# Patient Record
Sex: Female | Born: 1935 | Race: White | Hispanic: No | Smoking: Former smoker
Health system: Southern US, Community
[De-identification: ages and names within clinical notes are randomized; demographics above are authoritative.]

## PROBLEM LIST (undated history)

## (undated) DIAGNOSIS — F419 Anxiety disorder, unspecified: Secondary | ICD-10-CM

## (undated) DIAGNOSIS — L309 Dermatitis, unspecified: Secondary | ICD-10-CM

## (undated) DIAGNOSIS — J8410 Pulmonary fibrosis, unspecified: Secondary | ICD-10-CM

## (undated) DIAGNOSIS — K649 Unspecified hemorrhoids: Secondary | ICD-10-CM

## (undated) DIAGNOSIS — I429 Cardiomyopathy, unspecified: Secondary | ICD-10-CM

## (undated) DIAGNOSIS — J449 Chronic obstructive pulmonary disease, unspecified: Secondary | ICD-10-CM

## (undated) DIAGNOSIS — B029 Zoster without complications: Secondary | ICD-10-CM

## (undated) DIAGNOSIS — M1990 Unspecified osteoarthritis, unspecified site: Secondary | ICD-10-CM

## (undated) DIAGNOSIS — M810 Age-related osteoporosis without current pathological fracture: Secondary | ICD-10-CM

## (undated) DIAGNOSIS — J45909 Unspecified asthma, uncomplicated: Secondary | ICD-10-CM

## (undated) DIAGNOSIS — J302 Other seasonal allergic rhinitis: Secondary | ICD-10-CM

## (undated) DIAGNOSIS — H269 Unspecified cataract: Secondary | ICD-10-CM

## (undated) DIAGNOSIS — E119 Type 2 diabetes mellitus without complications: Secondary | ICD-10-CM

## (undated) DIAGNOSIS — I10 Essential (primary) hypertension: Secondary | ICD-10-CM

## (undated) DIAGNOSIS — F32A Depression, unspecified: Secondary | ICD-10-CM

## (undated) DIAGNOSIS — G4734 Idiopathic sleep related nonobstructive alveolar hypoventilation: Secondary | ICD-10-CM

## (undated) DIAGNOSIS — F329 Major depressive disorder, single episode, unspecified: Secondary | ICD-10-CM

## (undated) DIAGNOSIS — M109 Gout, unspecified: Secondary | ICD-10-CM

## (undated) DIAGNOSIS — E785 Hyperlipidemia, unspecified: Secondary | ICD-10-CM

## (undated) HISTORY — DX: Unspecified osteoarthritis, unspecified site: M19.90

## (undated) HISTORY — DX: Other seasonal allergic rhinitis: J30.2

## (undated) HISTORY — DX: Unspecified cataract: H26.9

## (undated) HISTORY — PX: CARDIAC CATHETERIZATION: SHX172

## (undated) HISTORY — DX: Cardiomyopathy, unspecified: I42.9

## (undated) HISTORY — PX: OPEN REDUCTION INTERNAL FIXATION (ORIF) DISTAL RADIAL FRACTURE: SHX5989

## (undated) HISTORY — DX: Hyperlipidemia, unspecified: E78.5

## (undated) HISTORY — DX: Pulmonary fibrosis, unspecified: J84.10

## (undated) HISTORY — DX: Unspecified hemorrhoids: K64.9

## (undated) HISTORY — DX: Zoster without complications: B02.9

## (undated) HISTORY — DX: Essential (primary) hypertension: I10

## (undated) HISTORY — DX: Unspecified asthma, uncomplicated: J45.909

## (undated) HISTORY — PX: KNEE ARTHROSCOPY: SUR90

## (undated) HISTORY — PX: OTHER SURGICAL HISTORY: SHX169

## (undated) HISTORY — PX: CHOLECYSTECTOMY: SHX55

## (undated) HISTORY — PX: JOINT REPLACEMENT: SHX530

## (undated) HISTORY — DX: Chronic obstructive pulmonary disease, unspecified: J44.9

## (undated) HISTORY — DX: Age-related osteoporosis without current pathological fracture: M81.0

## (undated) HISTORY — PX: BLADDER REPAIR: SHX76

## (undated) HISTORY — PX: TONSILLECTOMY AND ADENOIDECTOMY: SUR1326

## (undated) HISTORY — DX: Gout, unspecified: M10.9

## (undated) HISTORY — DX: Dermatitis, unspecified: L30.9

## (undated) HISTORY — DX: Anxiety disorder, unspecified: F41.9

## (undated) HISTORY — PX: MOUTH SURGERY: SHX715

## (undated) HISTORY — DX: Major depressive disorder, single episode, unspecified: F32.9

## (undated) HISTORY — DX: Depression, unspecified: F32.A

## (undated) HISTORY — DX: Type 2 diabetes mellitus without complications: E11.9

## (undated) HISTORY — DX: Idiopathic sleep related nonobstructive alveolar hypoventilation: G47.34

---

## 2010-10-14 ENCOUNTER — Ambulatory Visit: Payer: Self-pay | Admitting: General Practice

## 2011-05-01 ENCOUNTER — Ambulatory Visit: Payer: Self-pay | Admitting: Internal Medicine

## 2011-05-05 ENCOUNTER — Ambulatory Visit: Payer: Self-pay | Admitting: General Practice

## 2011-05-21 ENCOUNTER — Inpatient Hospital Stay: Payer: Self-pay | Admitting: General Practice

## 2011-08-08 ENCOUNTER — Ambulatory Visit: Payer: Self-pay | Admitting: Internal Medicine

## 2011-12-04 ENCOUNTER — Ambulatory Visit: Payer: Self-pay | Admitting: Cardiology

## 2012-08-10 ENCOUNTER — Emergency Department: Payer: Self-pay | Admitting: Emergency Medicine

## 2012-08-10 LAB — CBC
HCT: 39.7 % (ref 35.0–47.0)
HGB: 12.8 g/dL (ref 12.0–16.0)
MCH: 24.2 pg — ABNORMAL LOW (ref 26.0–34.0)
MCHC: 32.1 g/dL (ref 32.0–36.0)
MCV: 75 fL — ABNORMAL LOW (ref 80–100)
RDW: 15.5 % — ABNORMAL HIGH (ref 11.5–14.5)
WBC: 6.4 10*3/uL (ref 3.6–11.0)

## 2012-08-10 LAB — COMPREHENSIVE METABOLIC PANEL
Albumin: 4.1 g/dL (ref 3.4–5.0)
Alkaline Phosphatase: 103 U/L (ref 50–136)
Anion Gap: 7 (ref 7–16)
BUN: 11 mg/dL (ref 7–18)
Calcium, Total: 8.7 mg/dL (ref 8.5–10.1)
Chloride: 103 mmol/L (ref 98–107)
Co2: 30 mmol/L (ref 21–32)
Creatinine: 0.82 mg/dL (ref 0.60–1.30)
EGFR (African American): >60
Glucose: 107 mg/dL — ABNORMAL HIGH (ref 65–99)
SGPT (ALT): 17 U/L (ref 12–78)
Total Protein: 7.4 g/dL (ref 6.4–8.2)

## 2012-08-10 LAB — URINALYSIS, COMPLETE
Bacteria: NONE SEEN
Ketone: NEGATIVE
RBC,UR: <1 /HPF (ref 0–5)
Specific Gravity: 1.002 (ref 1.003–1.030)
WBC UR: 1 /HPF (ref 0–5)

## 2012-10-11 IMAGING — NM NM LUNG SCAN
2 series · 12 of 12 positions shown · non-contrast
Comparison: none

REASON FOR EXAM: pe
COMMENTS:

[Series 1000: perfusion · 1.95mm/px · 3 acquisitions, 6 frames shown]
[im 1/3]
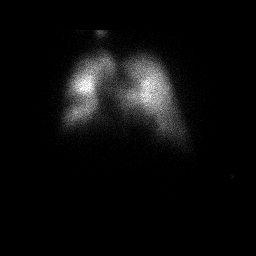
[im 1/3]
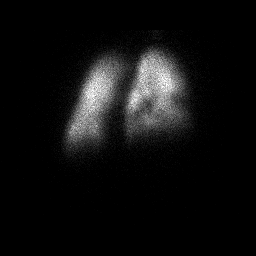
[im 2/3]
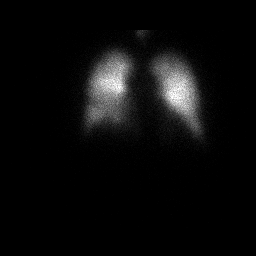
[im 2/3]
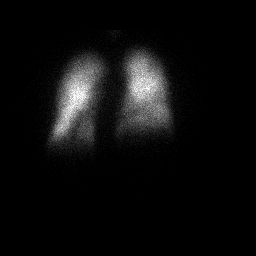
[im 3/3]
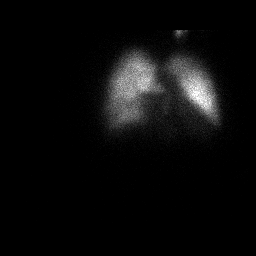
[im 3/3]
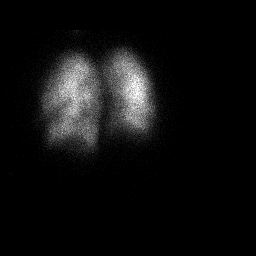

[Series 1000: ventilation · 3.90mm/px · 3 acquisitions, 6 frames shown]
[im 1/3]
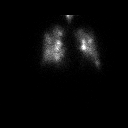
[im 1/3]
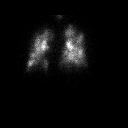
[im 2/3]
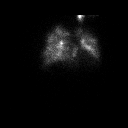
[im 2/3]
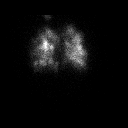
[im 3/3]
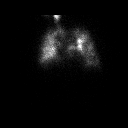
[im 3/3]
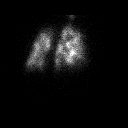

[12 of 12 positions shown; findings below may reference images not displayed]

PROCEDURE:     NM  - NM VQ LUNG SCAN  - [DATE] [DATE] [DATE]  [DATE]

RESULT:     Following aerosol ministration of 39.6 mCi technetium 99m DTPA,
ventilation lung scan was performed an 8 views. After the ventilation scan
and following intravenous administration of 4.18 mCi technetium 99 M labeled
macroaggregated albumin, perfusion lung scan was performed in 6 views. There
is a diffusely heterogeneous distribution of tracer activity in both lungs
on the ventilation scan. This appearance is consistent with ventilatory
disease. And in the LAO view there is a mismatched perfusion defect in the
right lower lung field, likely in the right middle lobe although the finding
is not confirmed and other views. This still is considered low to
intermediate probability but is sufficient to warrant CT examination if such
is clinically feasible. No other mismatched perfusion defects are seen.
IMPRESSION: 1. There is a markedly heterogeneous distribution of tracer activity on the
ventilation scan consistent with ventilatory disease.
2. In the LAO view of the perfusion scan, there is a mismatched perfusion
defect inferiorly on the right and likely in the right middle lobe. The
finding is considered low to intermediate probability for pulmonary embolus.
If feasible a followup chest CT is suggested.

## 2013-05-04 ENCOUNTER — Ambulatory Visit: Payer: Self-pay

## 2013-05-26 ENCOUNTER — Ambulatory Visit: Payer: Self-pay

## 2013-07-27 LAB — COMPREHENSIVE METABOLIC PANEL
BUN: 17 mg/dL (ref 7–18)
Bilirubin,Total: 0.3 mg/dL (ref 0.2–1.0)
Calcium, Total: 9.1 mg/dL (ref 8.5–10.1)
Chloride: 104 mmol/L (ref 98–107)
Co2: 30 mmol/L (ref 21–32)
Creatinine: 0.82 mg/dL (ref 0.60–1.30)
EGFR (African American): >60
EGFR (Non-African Amer.): >60
Glucose: 132 mg/dL — ABNORMAL HIGH (ref 65–99)
Osmolality: 285 (ref 275–301)
SGPT (ALT): 19 U/L (ref 12–78)
Sodium: 141 mmol/L (ref 136–145)

## 2013-07-27 LAB — CBC
HCT: 41.8 % (ref 35.0–47.0)
MCH: 23.5 pg — ABNORMAL LOW (ref 26.0–34.0)
MCHC: 31.1 g/dL — ABNORMAL LOW (ref 32.0–36.0)
Platelet: 361 10*3/uL (ref 150–440)
RDW: 16.2 % — ABNORMAL HIGH (ref 11.5–14.5)
WBC: 13.4 10*3/uL — ABNORMAL HIGH (ref 3.6–11.0)

## 2013-07-29 ENCOUNTER — Inpatient Hospital Stay: Payer: Self-pay | Admitting: Internal Medicine

## 2013-07-30 LAB — CREATININE, SERUM
EGFR (African American): >60
EGFR (Non-African Amer.): >60

## 2013-10-18 DIAGNOSIS — E559 Vitamin D deficiency, unspecified: Secondary | ICD-10-CM | POA: Insufficient documentation

## 2013-10-18 DIAGNOSIS — D649 Anemia, unspecified: Secondary | ICD-10-CM | POA: Insufficient documentation

## 2013-10-18 DIAGNOSIS — I10 Essential (primary) hypertension: Secondary | ICD-10-CM | POA: Insufficient documentation

## 2013-10-18 DIAGNOSIS — IMO0002 Reserved for concepts with insufficient information to code with codable children: Secondary | ICD-10-CM | POA: Insufficient documentation

## 2013-10-18 DIAGNOSIS — M1710 Unilateral primary osteoarthritis, unspecified knee: Secondary | ICD-10-CM | POA: Insufficient documentation

## 2013-10-18 DIAGNOSIS — J449 Chronic obstructive pulmonary disease, unspecified: Secondary | ICD-10-CM | POA: Insufficient documentation

## 2013-10-31 ENCOUNTER — Ambulatory Visit: Payer: Self-pay | Admitting: Ophthalmology

## 2013-10-31 DIAGNOSIS — I10 Essential (primary) hypertension: Secondary | ICD-10-CM

## 2013-10-31 LAB — POTASSIUM: POTASSIUM: 4 mmol/L (ref 3.5–5.1)

## 2013-11-09 ENCOUNTER — Ambulatory Visit: Payer: Self-pay | Admitting: Ophthalmology

## 2013-12-30 IMAGING — CT CT ABD-PELV W/O CM
1 of 2 series · 15 of 32 positions shown, 19 images · non-contrast
Comparison: none

REASON FOR EXAM: (1) abdominal pain; (2) abdominal pain
COMMENTS:

[Series 2: 3mm soft tissue · axial · 0.77mm/px · z∈[-406,+23]mm · 15 of 157 slices shown, 19 images]
[im 7/157  soft-tissue]
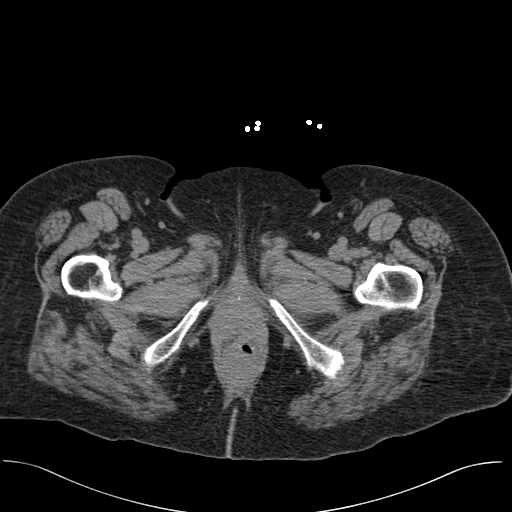
[im 7/157  bone]
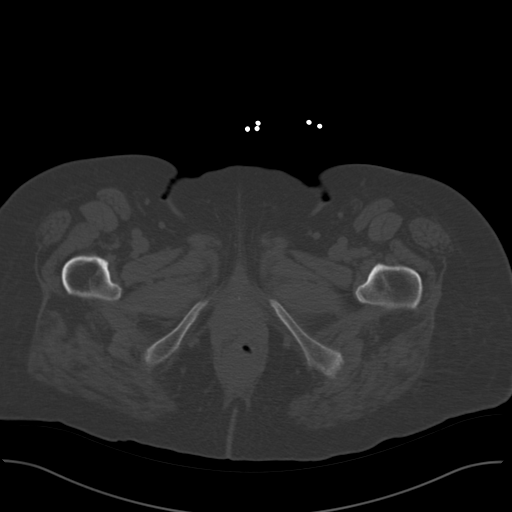
[im 21/157  soft-tissue]
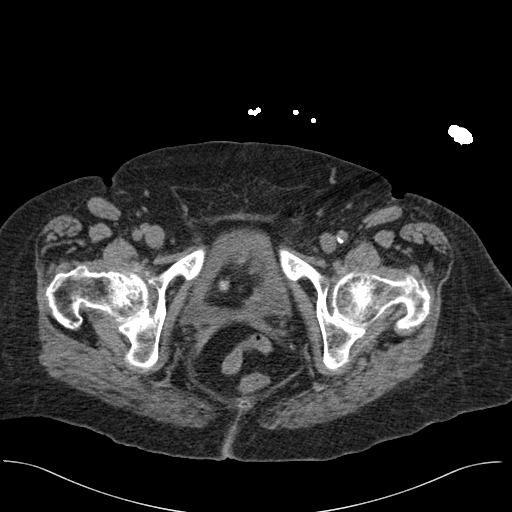
[im 34/157  soft-tissue]
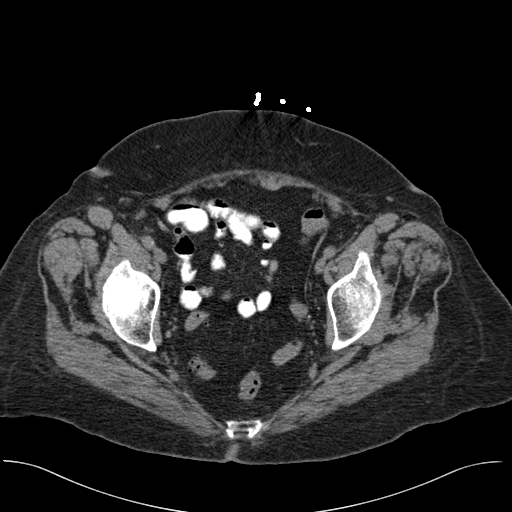
[im 41/157  soft-tissue]
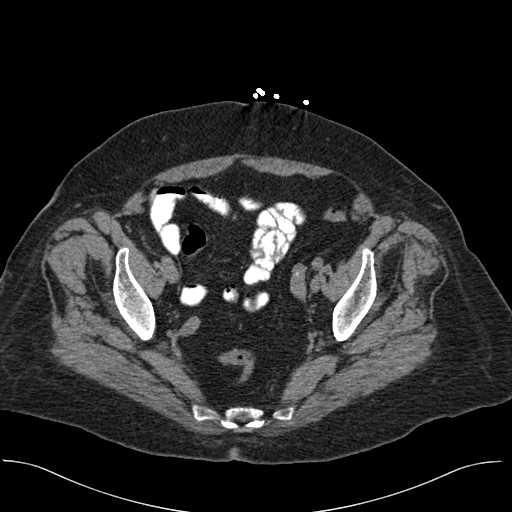
[im 55/157  soft-tissue]
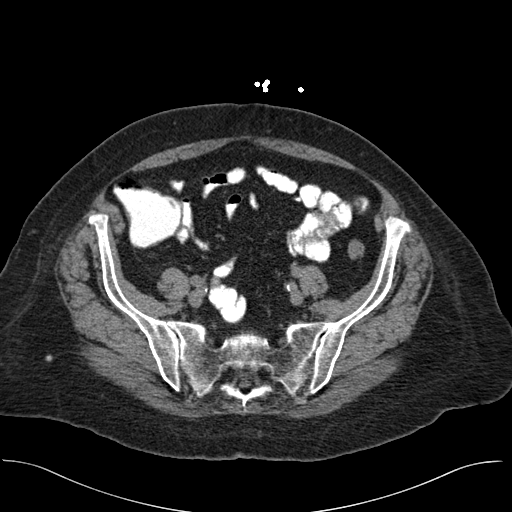
[im 68/157  soft-tissue]
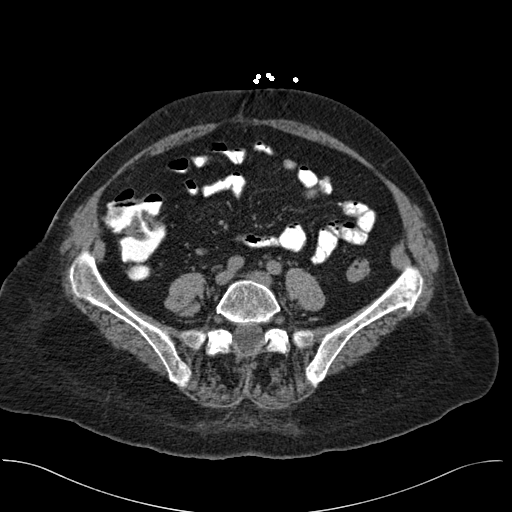
[im 82/157  soft-tissue]
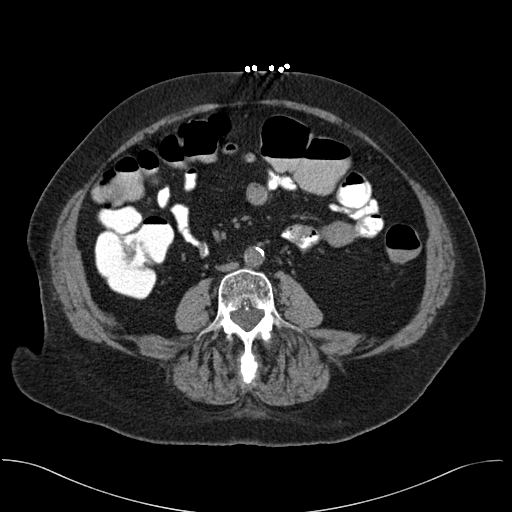
[im 89/157  soft-tissue]
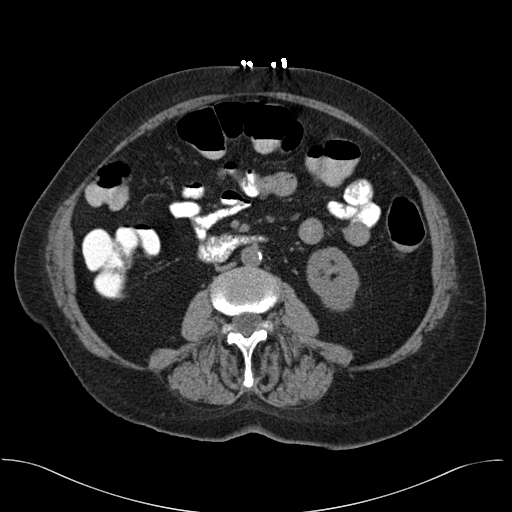
[im 102/157  soft-tissue]
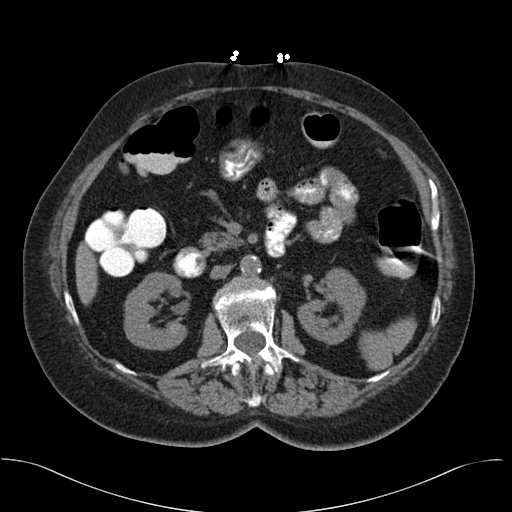
[im 102/157  bone]
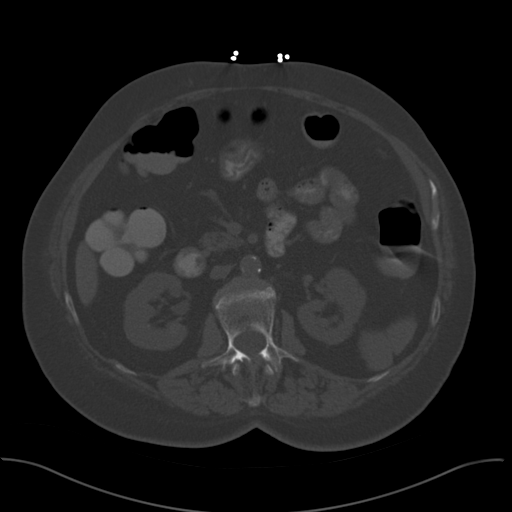
[im 116/157  soft-tissue]
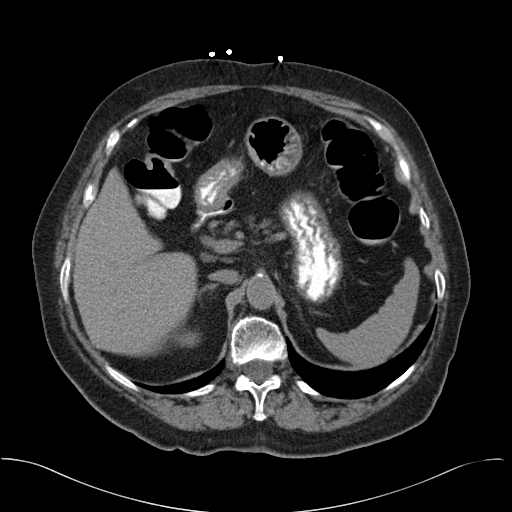
[im 123/157  soft-tissue]
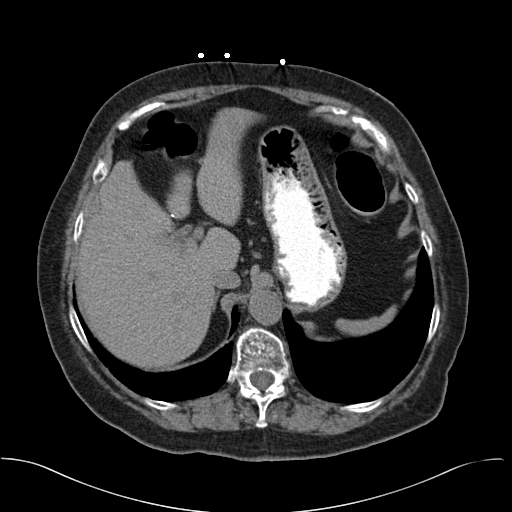
[im 129/157  lung]
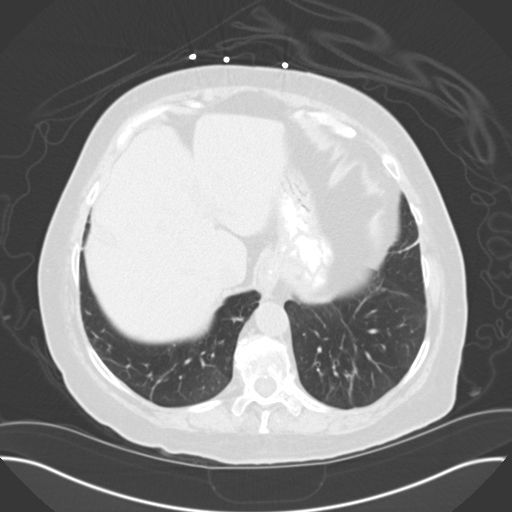
[im 136/157  soft-tissue]
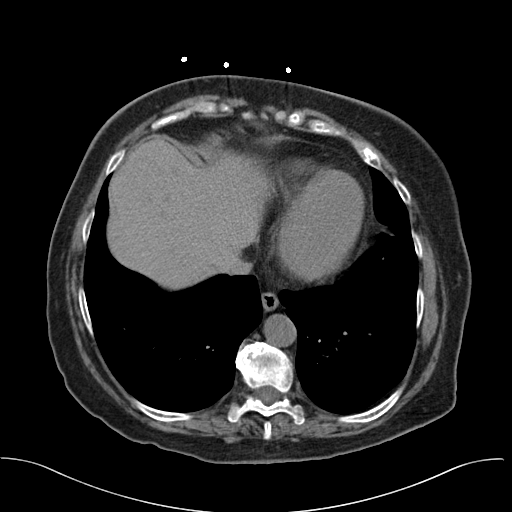
[im 136/157  lung]
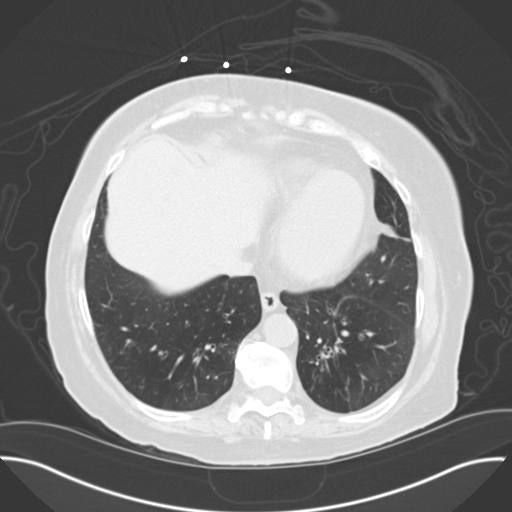
[im 143/157  lung]
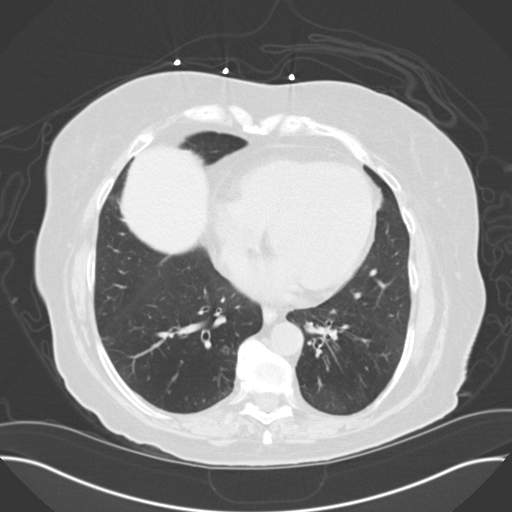
[im 150/157  soft-tissue]
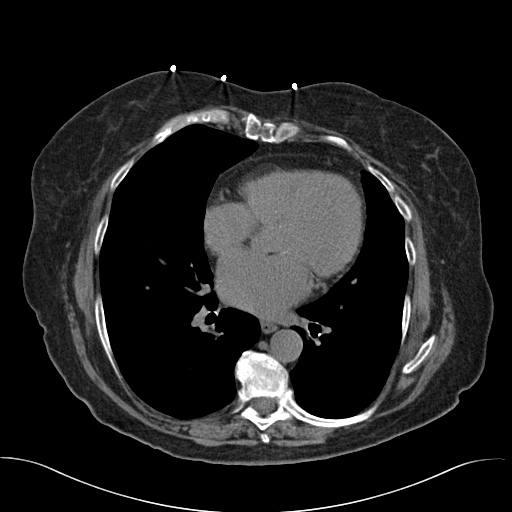
[im 150/157  lung]
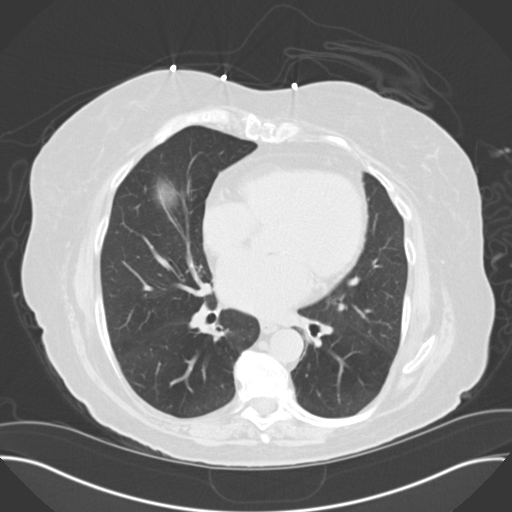

[15 of 32 positions shown; findings below may reference images not displayed]

PROCEDURE:     CT  - CT ABDOMEN AND PELVIS W[DATE]  [DATE]

RESULT:     Axial CT scanning was performed through the abdomen and pelvis
following administration of oral contrast only. Review of multiplanar
reconstructed images was performed separately on the VIA monitor.

The orally administered contrast has traversed the normal appearing small
bowel and reached as far distally as the mid to distal transverse colon. The
contrast here is fairly dilute. The descending colon and rectosigmoid
contain no contrast. There is no evidence of obstruction or constipation. No
bowel wall thickening or inflammatory change is demonstrated. The ileocecal
valve is normal in appearance. The appendix is not discretely identified.

The gallbladder is surgically absent. The liver, partially distended
stomach, pancreas, spleen, adrenal glands, and kidneys exhibit no acute
abnormality. The caliber of the abdominal aorta is normal. There is no
periaortic nor pericaval lymphadenopathy. Within the pelvis the urinary
bladder is only partially distended. There is no free fluid. No pelvic
lymphadenopathy is demonstrated. The uterus is surgically absent. There are
no adnexal masses. There is no inguinal nor umbilical hernia. The lung bases
exhibit mild emphysematous changes.
IMPRESSION: 1. There is no evidence of a small or large bowel obstruction. I do not see
a large volume of stool within the colon. The orally administered contrast
has traversed the small bowel and the ascending and transverse portions of
the large bowel and has just reached the splenic flexure. I do not see
evidence of a transition point to suggest malignancy.
2. There is no evidence of colitis or diverticulitis.
3. There is no evidence of acute hepatobiliary abnormality.
4. There is no intra-abdominal or pelvic inflammatory process or
lymphadenopathy.

[REDACTED]

## 2014-01-05 ENCOUNTER — Ambulatory Visit: Payer: Self-pay | Admitting: Ophthalmology

## 2014-01-05 LAB — POTASSIUM: Potassium: 4 mmol/L (ref 3.5–5.1)

## 2014-01-17 ENCOUNTER — Ambulatory Visit: Payer: Self-pay | Admitting: Ophthalmology

## 2014-02-22 ENCOUNTER — Ambulatory Visit: Payer: Self-pay | Admitting: Orthopedic Surgery

## 2014-02-22 LAB — CBC WITH DIFFERENTIAL/PLATELET
BASOS ABS: 0.1 10*3/uL (ref 0.0–0.1)
Basophil %: 1.4 %
Eosinophil #: 0.2 10*3/uL (ref 0.0–0.7)
Eosinophil %: 1.8 %
HCT: 37.7 % (ref 35.0–47.0)
HGB: 11.9 g/dL — ABNORMAL LOW (ref 12.0–16.0)
LYMPHS ABS: 2 10*3/uL (ref 1.0–3.6)
LYMPHS PCT: 24 %
MCH: 25.2 pg — ABNORMAL LOW (ref 26.0–34.0)
MCHC: 31.6 g/dL — ABNORMAL LOW (ref 32.0–36.0)
MCV: 80 fL (ref 80–100)
Monocyte #: 0.8 x10 3/mm (ref 0.2–0.9)
Monocyte %: 9.7 %
NEUTROS PCT: 63.1 %
Neutrophil #: 5.2 10*3/uL (ref 1.4–6.5)
Platelet: 345 10*3/uL (ref 150–440)
RBC: 4.72 10*6/uL (ref 3.80–5.20)
RDW: 15.3 % — AB (ref 11.5–14.5)
WBC: 8.2 10*3/uL (ref 3.6–11.0)

## 2014-02-22 LAB — BASIC METABOLIC PANEL
ANION GAP: 8 (ref 7–16)
BUN: 15 mg/dL (ref 7–18)
CHLORIDE: 99 mmol/L (ref 98–107)
CREATININE: 0.97 mg/dL (ref 0.60–1.30)
Calcium, Total: 8.9 mg/dL (ref 8.5–10.1)
Co2: 31 mmol/L (ref 21–32)
EGFR (African American): >60
EGFR (Non-African Amer.): 54 — ABNORMAL LOW
Glucose: 108 mg/dL — ABNORMAL HIGH (ref 65–99)
Osmolality: 277 (ref 275–301)
Potassium: 3.1 mmol/L — ABNORMAL LOW (ref 3.5–5.1)
Sodium: 138 mmol/L (ref 136–145)

## 2014-02-23 ENCOUNTER — Ambulatory Visit: Payer: Self-pay | Admitting: Orthopedic Surgery

## 2014-09-25 ENCOUNTER — Ambulatory Visit: Payer: Self-pay | Admitting: Infectious Diseases

## 2014-11-24 NOTE — Consult Note (Signed)
PATIENT NAME:  Brittany Munoz, Brittany Munoz MR#:  [id number] DATE OF BIRTH:  [birth year]  DATE OF CONSULTATION:  07/28/2013  REFERRING PHYSICIAN:   CONSULTING PHYSICIAN:  Linus Galasodd Ceaira Ernster, DPM  REASON FOR CONSULTATION:  This is an 81 year old female, who sustained an injury yesterday when she missed her step coming off of a curb and fell. She fell on her right knee and foot and also injured her ribs. Presented to the Emergency Department where she was diagnosed with two fractures in her right foot. Due to significant pain, she was admitted. The patient does state that the pain feels a little bit better in her foot today with most of the pain being up in her rib cage.   PAST MEDICAL HISTORY:  1.  Peptic ulcer disease. 2.  GERD. 3.  COPD. 4.  Restless leg syndrome.  5.  Chronic respiratory failure, on oxygen at bedtime.  6.  Anemia of chronic disease.  7.  Osteoarthritis. 8.  Diet-controlled diabetes.  9.  Depression. 10.  Cardiomyopathy.  11.  Hypertension.  12.  Asthma.   PAST SURGICAL HISTORY: 1.  Hysterectomy.  2.  Cholecystectomy.  3.  Right total knee replacement. 4.  Breast biopsy.   MEDICATIONS:  See the list per Medicine's note.   ALLERGIES:  BACTRIM, ASPIRIN, ULTRAM, AVELOX.   FAMILY HISTORY:  Colon cancer.   SOCIAL HISTORY:  Lives in an assisted living facility. Denies alcohol or tobacco use.   REVIEW OF SYSTEMS:  Injury on 12/24. Some swelling and bruising in the right foot. Also relates some significant pain in her right knee and the right side of her rib cage. Does relate some numbness and tingling in the right foot since her injury. Denies any fever or chills. No stomach pain, heartburn or nausea. No dysurias or incontinence. Some mild hearing loss. No vision changes.   PHYSICAL EXAMINATION:  VASCULAR:  DP and PT pulses are fully palpable. Capillary filling time intact.  NEUROLOGICAL:  Epicritic sensations are grossly intact to the right foot and digits.  INTEGUMENT:  The skin is  warm, dry, and supple, mildly atrophic. There is some mild edema and faint ecchymosis in the right forefoot. No open lesions are present. No erythema.  MUSCULOSKELETAL:  Guarded range of motion in the right foot. Exquisite pain on palpation in the right forefoot, mostly around the second and third metatarsals. Muscle testing is deferred.   X-RAYS:  Three views of the right foot taken yesterday reveal fractures through the necks of metatarsals 2 and 3 on the right foot with some mild compression and plantar and lateral angulation. Overall there is except alignment with bony contact. Diffuse osteopenia is noted.   ASSESSMENT:  Fractured right second and third metatarsals.   PLAN:  I discussed with the patient the healing process for fractures in the foot. She has already been dispensed a surgical shoe and she was instructed on walking in this flat-footed, keeping the pressure off of the forefoot. Continue to elevate and use ice for now. We will follow up with her in 2 weeks outpatient for followup x-rays.    ____________________________ Linus Galasodd Lashundra Shiveley, DPM tc:jm D: 07/28/2013 14:05:41 ET T: 07/28/2013 15:27:53 ET JOB#: [id number]  cc: Linus Galasodd Evangelos Paulino, DPM, <Dictator> Tyon Cerasoli DPM ELECTRONICALLY SIGNED 07/29/2013 12:18

## 2014-11-24 NOTE — Discharge Summary (Signed)
Dates of Admission and Diagnosis:  Date of Admission 29-Jul-2013   Date of Discharge 31-Jul-2013   Admitting Diagnosis Right 2nd, 3rd metatarsal fracture and rib contusion, s/p fall   Final Diagnosis Right 2nd, 3rd metatarsal fracture, rib contusion, s/p fall, COPD, HTN   Discharge Diagnosis 1 Right 2nd, 3rd metatarsal fracture   2 Rib contusion, s/p fall   3 COPD   4 HTN    Chief Complaint/History of Present Illness Patient presented to the ED s/p fall, c/o pain in the right foot and right chest wall discomfort. X rays revealed minimally displaced right 2nd, 3rd metatarsal head fracture. Rib series was negative. Patient was in significant pain and hospitalized for further management.   Hepatic:  24-Dec-14 19:31   Bilirubin, Total 0.3  Alkaline Phosphatase 112 (45-117 NOTE: New Reference Range 06/24/13)  SGPT (ALT) 19  SGOT (AST) 21  Total Protein, Serum 7.7  Albumin, Serum 4.3  Routine Chem:  24-Dec-14 19:31   Creatinine (comp) 0.82  eGFR (African American) >60  eGFR (Non-African American) >60 (eGFR values <40m/min/1.73 m2 may be an indication of chronic kidney disease (CKD). Calculated eGFR is useful in patients with stable renal function. The eGFR calculation will not be reliable in acutely ill patients when serum creatinine is changing rapidly. It is not useful in  patients on dialysis. The eGFR calculation may not be applicable to patients at the low and high extremes of body sizes, pregnant women, and vegetarians.)  Glucose, Serum  132  BUN 17  Sodium, Serum 141  Potassium, Serum 3.7  Chloride, Serum 104  CO2, Serum 30  Calcium (Total), Serum 9.1  Osmolality (calc) 285  Anion Gap 7  27-Dec-14 03:13   Creatinine (comp) 0.85  eGFR (African American) >60  eGFR (Non-African American) >60 (eGFR values <660mmin/1.73 m2 may be an indication of chronic kidney disease (CKD). Calculated eGFR is useful in patients with stable renal function. The eGFR calculation  will not be reliable in acutely ill patients when serum creatinine is changing rapidly. It is not useful in  patients on dialysis. The eGFR calculation may not be applicable to patients at the low and high extremes of body sizes, pregnant women, and vegetarians.)  Routine Hem:  24-Dec-14 19:31   WBC (CBC)  13.4  RBC (CBC)  5.53  Hemoglobin (CBC) 13.0  Hematocrit (CBC) 41.8  Platelet Count (CBC) 361 (Result(s) reported on 27 Jul 2013 at 08:13PM.)  MCV  76  MCH  23.5  MCHC  31.1  RDW  16.2   PERTINENT RADIOLOGY STUDIES: XRay:    24-Dec-14 17:50, Chest PA and Lateral  Chest PA and Lateral   REASON FOR EXAM:    s/p fall; right ribcage pain  COMMENTS:       PROCEDURE: DXR - DXR CHEST PA (OR AP) AND LATERAL  - Jul 27 2013  5:50PM     CLINICAL DATA:  Status post fall.  Right chest pain.    EXAM:  CHEST  2 VIEW    COMPARISON:  PA and lateralchest 05/23/2011.    FINDINGS:  The lungs are clear. No pneumothorax is identified. Eventration of  the right hemidiaphragm is unchanged. No pleural effusion is seen.  No focal bony abnormality.     IMPRESSION:  No acute disease.      Electronically Signed    By: ThInge Rise.D.    On: 07/27/2013 18:00         Verified By: THRamond DialM.D.,  24-Dec-14 17:50, Foot Right Complete  Foot Right Complete   REASON FOR EXAM:    s/p fall; c/o right foot pain  COMMENTS:       PROCEDURE: DXR - DXR FOOT RT COMPLETE W/OBLIQUES  - Jul 27 2013  5:50PM     CLINICAL DATA:  Fall, right foot pain    EXAM:  RIGHT FOOT COMPLETE - 3+ VIEW    COMPARISON:  None.    FINDINGS:  There are acute minimally displaced and angulated fractures of the  right 2nd and 3rd metatarsal heads distally. Bones are osteopenic.  Degenerative changes of the right 1st MTP joint. No associated  subluxation or dislocation. Mild soft tissueswelling.     IMPRESSION:  Acute minimally displaced right 2nd and 3rd metatarsal  head  fractures      Electronically Signed    By: Daryll Brod M.D.    On: 07/27/2013 17:59         Verified By: Earl Gala, M.D.,    24-Dec-14 19:11, Ribs Right Unilateral  Ribs Right Unilateral   REASON FOR EXAM:    rib pain s/p fall  COMMENTS:       PROCEDURE: DXR - DXR RIBS RIGHT UNILATERAL  - Jul 27 2013  7:11PM     CLINICAL DATA:  Right rib pain following fall.    EXAM:  RIGHT RIBS - 2 VIEW    COMPARISON:  Chest radiographs 07/27/2013    FINDINGS:  Moderate S-shaped thoracolumbar scoliosis is again seen. No rib  fracture is identified. Visualized portions of the lungs are clear.  Thoracic aortic calcifications are noted.     IMPRESSION:  No rib fracture identified.      ElectronicallySigned    By: Logan Bores    On: 07/27/2013 19:19         Verified By: Ferol Luz, M.D.,  CT:    24-Dec-14 21:01, CT Abdomen and Pelvis With Contrast  CT Abdomen and Pelvis With Contrast   REASON FOR EXAM:    (1) RUQ pain s/p trauma. No PO contrast; (2) RUQ pain   s/p trauma, NO PO contrast  COMMENTS:       PROCEDURE: CT  - CT ABDOMEN / PELVIS  W  - Jul 27 2013  9:01PM     CLINICAL DATA:  Right upper quadrant pain, trauma    EXAM:  CT ABDOMEN AND PELVIS WITH CONTRAST    TECHNIQUE:  Multidetector CT imaging of the abdomen and pelvis was performed  using the standard protocol following bolus administration of  intravenous contrast.  CONTRAST:  100 cc Isovue 370    COMPARISON:  08/10/2012    FINDINGS:  Lung bases demonstrate emphysema with minor atelectasis versus  scarring. No lower lobe pneumonia. Mild cardiac enlargement.  Coronary calcifications noted. No pericardial or pleural effusion.  Degenerative changes of the lower thoracic spine.    Abdomen: Prior cholecystectomy noted. Slight biliary prominence,  suspect post cholecystectomy effect. The liver, biliary system,  pancreas, spleen, and adrenal glands are within normal limits for  age and  demonstrate no acute process. Cortical scarring in the right  kidney upper pole. Right midpole hyperdense cyst noted measuring 10  mm, image 40 which is unchanged. Additional subcentimeter left renal  cyst evident. No renal obstruction or hydronephrosis.    Atherosclerosis of the aorta without aneurysm.    Negative for bowel obstruction, dilatation, ileus, or free air.    No abdominal free fluid, fluid collection, hemorrhage, abscess, or  adenopathy.    Pelvis: Moderate distention of the urinary bladder. Prior  hysterectomy evident. No pelvic free fluid, fluid collection,  hemorrhage, abscess, adnexal abnormality, inguinal abnormality, or  hernia. No acute distal bowel process.  Degenerative changes of the spine diffusely.     IMPRESSION:  No acute intra-abdominal or pelvic finding or injury.    Prior cholecystectomy and hysterectomy    Chronic renal cysts and right upper pole renal scarring    Aortic atherosclerosis without aneurysm      Electronically Signed    By: Daryll Brod M.D.    On: 07/27/2013 21:09     Verified By: Earl Gala, M.D.,   Hospital Course:  Hospital Course Patient's received i.v morphine for pain in the right chest wall and right foot. Podiatry, PT eval and treatment. She received supplemental oxygen, nebulizer treatments and steroid inhaler. Pain has decreased significantly. Patient will go home on continuous oxygen at 2 litres/min. She will receive home health services including nursing, PT and nursing aid at [facility].   Condition on Discharge Stable   DISCHARGE INSTRUCTIONS HOME MEDS:  Medication Reconciliation: Patient's Home Medications at Discharge:     Medication Instructions  singulair 10 mg oral tablet  1 tab(s) orally once a day (at bedtime)    omeprazole 20 mg oral delayed release capsule  1 tab(s) orally once a day (at bedtime)    tenex 2 mg oral tablet  1 tab(s) orally once a day (at bedtime)    ventolin hfa  2 puff(s)  inhaled 4 times a day, As Needed- for Wheezing , for Shortness of Breath    vitamin b12 1000 mcg oral tablet  1 tab(s) orally once a day   lorazepam 1 mg oral tablet  1 tab(s) orally in the morning and 2 tabs at bedtime    norvasc 10 mg oral tablet  1 tab(s) orally once a day   cymbalta 30 mg oral delayed release capsule  1 tab(s) orally once a day   pulmicort flexhaler 180 mcg/inh inhalation powder  1 puff(s) inhaled once a day   albuterol 2.5 mg/3 ml (0.083%) inhalation solution  3 milliliter(s) inhaled every 4 hours, As Needed- for Shortness of Breath , for Wheezing    torsemide 20 mg oral tablet  1 tab(s) orally once a day (in the morning)   vitamin d3 50,000 intl units oral capsule  1 cap(s) orally once a week   bentyl 10 mg oral capsule  1 cap(s) orally 4 times a day, As Needed for abdominal discomfort   acetaminophen 325 mg oral tablet  2 tab(s) orally every 4 hours, As needed, pain or temp. greater than 100.4   docusate sodium 100 mg oral capsule  1 cap(s) orally 2 times a day    PRESCRIPTIONS: PRINTED AND PLACED ON CHART; ELECTRONICALLY SUBMITTED   Physician's Instructions:  Home Health? Yes   [facility]  Nurse  Nurse Aid   Home Health Instructions COPD on continuous oxygen at 2 L/min, PT to help ambulate the patient   Home Oxygen? Yes   Diet Low Sodium  Carbohydrate Controlled (ADA) Diet   Activity Limitations As tolerated   Return to Work Not Applicable   Time frame for Follow Up Appointment 1-2 weeks  [facility]   Electronic Signatures: Glendon Axe (MD)  (Signed 28-Dec-14 10:55)  Authored: ADMISSION DATE AND DIAGNOSIS, CHIEF COMPLAINT/HPI, PERTINENT LABS, [facility], PATIENT INSTRUCTIONS  Last Updated: 28-Dec-14 10:55 by Glendon Axe (MD)

## 2014-11-24 NOTE — H&P (Signed)
PATIENT NAME:  Brittany Munoz, Brittany Munoz MR#:  [id number] DATE OF BIRTH:  1928/02/19  DATE OF ADMISSION:  07/27/2013  PRIMARY CARE PHYSICIAN: Dr. Ola Spurr.   CHIEF COMPLAINT: Fall and pain.   HISTORY OF PRESENT ILLNESS: This is a very pleasant 81 year old female with a history of hypertension, COPD on oxygen at night with chronic respiratory failure, borderline diabetes, osteoarthritis who presents with the above complaint. The patient says that she was getting out of her car and she accidentally fell on the curb and fell right to her knee and her right foot. She suffered a metatarsal fracture. She is in the ER. She has received several pain medications; however, due to pain, she is unable to go home. She is from assisted living, so the hospitalist was called for admission.   REVIEW OF SYSTEMS:  CONSTITUTIONAL: No fever, fatigue, weakness, weight loss or gain.  EYES: No blurry vision.   ENT: Some mild hearing loss. No seasonal allergies, postnasal drip or snoring.  RESPIRATORY: No cough, wheezing, hemoptysis. Positive COPD on oxygen at night.  CARDIOVASCULAR: No chest pain, palpitations, orthopnea, syncope, edema, arrhythmia or dyspnea on exertion.  GASTROINTESTINAL: No nausea, vomiting, diarrhea, abdominal pain, melena or ulcers.  GENITOURINARY: No dysuria or hematuria.  ENDOCRINE: No polyuria or polydipsia.  HEMATOLOGIC AND LYMPHATIC: Positive easy bruising. No bleeding or swollen glands.  SKIN: No rash or lesions.  MUSCULOSKELETAL: Positive limited activity. Positive osteoarthritis.  NEUROLOGIC: No history of CVA, TIA or seizures.  PSYCHIATRIC: Positive depression.   PAST MEDICAL HISTORY:  1. Peptic ulcer disease.  2. GERD.  3. COPD.  4. Restless leg syndrome.  5. Chronic respiratory failure, on oxygen at bedtime.  6. Anemia of chronic disease.  7. Osteoarthritis.  8. Diet-controlled diabetes.  9. Depression.  10. Cardiomyopathy.  11. Hypertension.  12. Asthma.   SURGICAL HISTORY:   1. Hysterectomy.  2. Cholecystectomy.  3. Right knee replacement.  4. Cyst on breast.    MEDICATIONS:  1. Vitamin D3 50,000 international units weekly.  2. Vitamin B12 1000 mcg daily.  3. Ventolin HFA.  4. Torsemide 20 mg daily.  5. Tenex 2 mg daily.  6. Singulair 10 mg daily.  7. Pulmicort 1 puff daily.  8. Omeprazole 20 mg daily.  9. Norvasc 10 mg daily.  10. Lorazepam 1 mg in the morning and 2 tablets at night.  11. Cymbalta 30 mg daily.  12. Bentyl 10 mg 4 times a day.  13. Albuterol nebulizer.   ALLERGIES: BACTRIM causes nausea, vomiting, diarrhea, ASPIRIN causes swelling, ULTRAM causes swelling and AVELOX.   FAMILY HISTORY: Colon cancer.   SOCIAL HISTORY: The patient lives in assisted living. No tobacco, alcohol or drug use.   PHYSICAL EXAMINATION:  VITAL SIGNS: Temperature 97.9, pulse 106, respirations 20, blood pressure 182/82 and 96% on room air.  GENERAL: The patient is alert, oriented, in moderate distress.  HEENT: Head is atraumatic. Pupils are round and reactive. Sclerae anicteric. Mucous membranes are moist. Oropharynx is clear.  NECK: Supple without JVD, carotid bruit, enlarged thyroid.   CARDIOVASCULAR: Regular rate and rhythm. No murmurs, gallops or rubs. PMI is hard to palpate due to body habitus.  LUNGS: Clear to auscultation without crackles, rales, rhonchi or wheezing. Normal percussion.  ABDOMEN: Obese. Bowel sounds are positive. Nontender. Hard to appreciate organomegaly due to body habitus. No rebound or guarding. She has some mild tenderness at the right lower quadrant which she says is from constipation, but again no rebound or guarding.  BACK: She has  some tenderness at the right lower rib area. No bruising is noted. No CVA or vertebral tenderness.  SKIN: No rash or lesions.   NEUROLOGIC: Cranial nerves II through XII intact. No focal deficits.  MUSCULOSKELETAL: The patient is able to move all extremities. She is even able to move her toes on the  right side.   LABORATORIES: White blood cells 13, hemoglobin 13, hematocrit 43, platelets of 361. Sodium 141, potassium 3.7, chloride 104, bicarb 30, BUN 17, creatinine 0.82, glucose is 132, calcium 9.1. Bilirubin 0.3, alk phos 112, ALT 19, AST 21, total protein 7.7, albumin is 4.3. Right rib x-ray shows no acute fracture. Right foot shows acute minimally displaced second and third metatarsal head fractures. Right knee shows no acute osseous or hardware abnormalities. Chest x-ray shows no acute cardiopulmonary disease. CT of the abdomen shows no acute intra-abdominal or pelvic finding or injury. Prior cholecystectomy and hysterectomy.   ASSESSMENT AND PLAN: This is a very pleasant 81 year old female from assisted living who presented after a mechanical fall and now has suffered a fracture of her metatarsals on the right foot and has intractable pain.  1. Intractable pain with contusion and fracture of her second and third distal metatarsals: The patient will be admitted basically for pain control, physical therapy or podiatry consultation and supportive care. She has no evidence of an acute fracture of the ribs, but she probably has chest wall contusion from her fall.  2. Accelerated hypertension: The patient's blood pressure was elevated probably due to pain. Will continue outpatient medications.  3. Depression: Will continue duloxetine.  4. Chronic obstructive pulmonary disease which seems to be stable at this time. Continue O2 p.r.n. as well as her inhalers.  5. Diet-controlled diabetes. Will order sliding scale insulin and diabetic diet.   The patient is DNR status.    TIME SPENT: Approximately 40 minutes.    ____________________________ Donell Beers. Benjie Karvonen, MD spm:gb D: 07/27/2013 23:06:57 ET T: 07/27/2013 23:46:26 ET JOB#: [id number]  cc: Temple Sporer P. Benjie Karvonen, MD, <Dictator> Cheral Marker. Ola Spurr, MD Ulice Bold P Paymon Rosensteel MD ELECTRONICALLY SIGNED 07/28/2013 2:12

## 2014-11-25 NOTE — Op Note (Signed)
PATIENT NAME:  Brittany Munoz, Brittany Munoz MR#:  [id number] DATE OF BIRTH:  [birth year]  DATE OF PROCEDURE:  01/17/2014  PREOPERATIVE DIAGNOSIS:  Senile cataract right eye.  POSTOPERATIVE DIAGNOSIS:  Senile cataract right eye.  PROCEDURE:  Phacoemulsification with posterior chamber intraocular lens implantation of the right eye.  LENS IMPLANT: ZCB00 14.5-diopter posterior chamber intraocular lens.  ULTRASOUND TIME:  21% of 1 minute 10 seconds.  CDE 14.7.   SURGEON:  Italyhad Brasington, MD  ANESTHESIA:  Topical with tetracaine drops and 2% Xylocaine jelly.  COMPLICATIONS:  None.  DESCRIPTION OF PROCEDURE:  The patient was identified in the holding room and transported to the operating room and placed in the supine position under the operating microscope.  The right eye was identified as the operative eye and it was prepped and draped in the usual sterile ophthalmic fashion.  A 1 millimeter clear-corneal paracentesis was made at the 12:00 position.  The anterior chamber was filled with Viscoat  viscoelastic.  A 2.4 millimeter keratome was used to make a near-clear corneal incision at the 9:00 position.  A curvilinear capsulorrhexis was made with a cystotome and capsulorrhexis forceps.  Balanced salt solution was used to hydrodissect and hydrodelineate the nucleus.  Phacoemulsification was then used in stop and chop fashion to remove the lens nucleus and epinucleus.  The remaining cortex was then removed using the irrigation and aspiration handpiece. Provisc was then placed into the capsular bag to distend it for lens placement.  A ZCB00 14.5-diopter lens was then injected into the capsular bag.  The remaining viscoelastic was aspirated.  Wounds were hydrated with balanced salt solution.  The anterior chamber was inflated to a physiologic pressure with balanced salt solution.  0.1 mL of cefuroxime 10 mg/mL were injected into the anterior chamber for a dose of 1 mg of intracameral antibiotic at the completion  of the case. Miostat was placed into the anterior chamber to constrict the pupil.  No wound leaks were noted.  Topical Polytrim ophthalmic drops and Maxitrol ointment were applied to the eye.    The patient was taken to the recovery room in stable condition without complications of anesthesia or surgery.  ____________________________ Deirdre Evenerhadwick R. Brasington, MD crb:aw D: 01/17/2014 12:09:18 ET T: 01/17/2014 12:22:28 ET JOB#: [id number]  cc: Deirdre Evenerhadwick R. Brasington, MD, <Dictator>   Lockie MolaHADWICK BRASINGTON MD ELECTRONICALLY SIGNED 01/18/2014 9:38

## 2014-11-25 NOTE — Op Note (Signed)
PATIENT NAME:  [name], Brittany Munoz DATE OF BIRTH:  [birth year]  DATE OF PROCEDURE:  11/09/2013  PREOPERATIVE DIAGNOSIS:  Senile cataract left eye.  POSTOPERATIVE DIAGNOSIS:  Senile cataract left eye.  PROCEDURE:  Phacoemulsification with posterior chamber intraocular lens implantation of the left eye.  LENS:  ZZCBOO 15.0-diopter posterior chamber intraocular lens.  ULTRASOUND TIME:  23 % of 1 minute, 27 seconds. CDE 19.9.   SURGEON:  Italyhad Guhan Bruington, MD  ANESTHESIA:  Topical with tetracaine drops and 2% Xylocaine jelly.  COMPLICATIONS:  None.  DESCRIPTION OF PROCEDURE: The patient was identified in the holding room and transported to the operating room and placed in the supine position under the operating microscope. The left eye was identified as the operative eye and it was prepped and draped in the usual sterile ophthalmic fashion.  A 1 mm clear-corneal paracentesis was made at the 1:30 position. The anterior chamber was filled with Viscoat viscoelastic. A 2.4 mm keratome was used to make a near-clear corneal incision at the 10:30 position. A curvilinear capsulorrhexis was made with a cystotome and capsulorrhexis forceps. Balanced salt solution was used to hydrodissect and hydrodelineate the nucleus.  Phacoemulsification was then used in stop and chop fashion to remove the lens nucleus and epinucleus. The remaining cortex was then removed using the irrigation and aspiration handpiece. Provisc was then placed into the capsular bag to distend it for lens placement. A ZZCBOO 15.0-diopter lens was then injected into the capsular bag. The remaining viscoelastic was aspirated.  Wounds were hydrated with balanced salt solution. The anterior chamber was inflated to a physiologic pressure with balanced salt solution. 0.1 mL of cefuroxime 10 mg/mL were injected into the anterior chamber for a dose of 1 mg of intracameral antibiotic at the completion of the case. Miostat was placed into  the anterior chamber to constrict the pupil. No wound leaks were noted. Topical Polytrim drops and Maxitrol ointment were applied to the eye. The patient was taken to the recovery room in stable condition without complications of anesthesia or surgery.   ____________________________ Deirdre Evenerhadwick R. Jerita Wimbush, MD crb:tc D: 11/09/2013 14:37:55 ET T: 11/09/2013 15:04:05 ET JOB#: [id number]  cc: Deirdre Evenerhadwick R. Arkie Tagliaferro, MD, <Dictator> Lockie MolaHADWICK Isabellarose Kope MD ELECTRONICALLY SIGNED 11/16/2013 14:51

## 2014-11-25 NOTE — Op Note (Signed)
PATIENT NAME:  [name], Brittany L MR#:  [id number] DATE OF BIRTH:  Apr 06, 1928  DATE OF PROCEDURE:  02/23/2014  PREOPERATIVE DIAGNOSIS: Left comminuted distal radius fracture.   POSTOPERATIVE DIAGNOSIS: Left comminuted distal radius fracture.   PROCEDURE: Open reduction internal fixation left distal radius.   SURGEON: Kennedy BuckerMichael Kanyla Omeara, M.D.   ANESTHESIA: General.   DESCRIPTION OF PROCEDURE: The patient was brought to the operating room and after adequate anesthesia was obtained the left arm was prepped and draped in the usual sterile fashion with a tourniquet applied to the upper arm. After appropriate patient identification and timeout procedures were completed, approximately 9.5 pounds of traction was placed through finger trap traction over the end of the table, and the tourniquet was raised to 250 mmHg. An incision was made over the FCR tendon and incision carried down to the tendon with hemostasis being achieved with electrocautery. The fascia overlying the tendon was incised and the tendon retracted radially to protect the radial artery. The deep fascia was then incised and the deep tissue retracted with elevation of some pronator. Most of the pronator had been torn off by the fracture. With traction length had been restored but there was still radial deviation of the distal fragment. There was adequate exposure of the distal fragment to allow for application of the short narrow DVR plate with a distal first technique. The plate was placed appropriately and K wire used to hold it in place. Multiple smooth pegs were placed. Following this the plate was brought down and over to the shaft fragment and again held in place with K wire. The 3 screws were inserted, first drilling and then placing 10 mm cortical screws. This brought the plate down to the shaft and helped align the distal fragment to the shaft in near anatomic alignment. Prior to reduction, 1 mL of bone putty was placed through the defect in the  fracture because of extensive posterior comminution in hopes of aiding in healing. After this had been completed, the tourniquet was let down. There was no excessive bleeding. The wound was thoroughly irrigated prior to placing the graft. The wound was closed with 3-0 Vicryl subcutaneously and 4-0 nylon for the skin. The wound was then dressed with Xeroform, 4 x 4's, Webril, a volar splint and an Ace wrap. The patient tolerated the procedure well and was sent to the recovery room in stable condition.   ESTIMATED BLOOD LOSS: Minimal.   COMPLICATIONS: None.   SPECIMEN: None.   IMPLANT: [employer] short narrow DVR plate with screws and smooth pegs.   TOURNIQUET TIME: 22 minutes at 250 mmHg.  ____________________________ Leitha SchullerMichael J. Nylan Nevel, MD mjm:sb D: 02/23/2014 16:16:56 ET T: 02/23/2014 17:10:33 ET JOB#: [id number]  cc: Leitha SchullerMichael J. Dalene Robards, MD, <Dictator> Leitha SchullerMICHAEL J Treylin Burtch MD ELECTRONICALLY SIGNED 02/23/2014 17:49

## 2014-12-16 IMAGING — CR RIGHT FOOT COMPLETE - 3+ VIEW
1 series · 3 of 3 positions shown · non-contrast
Comparison: None.

CLINICAL DATA: Fall, right foot pain

EXAM:
RIGHT FOOT COMPLETE - 3+ VIEW

[Series 1: x foot ap right · 0.14mm/px · 3 of 3 slices shown]
[im 1/3]
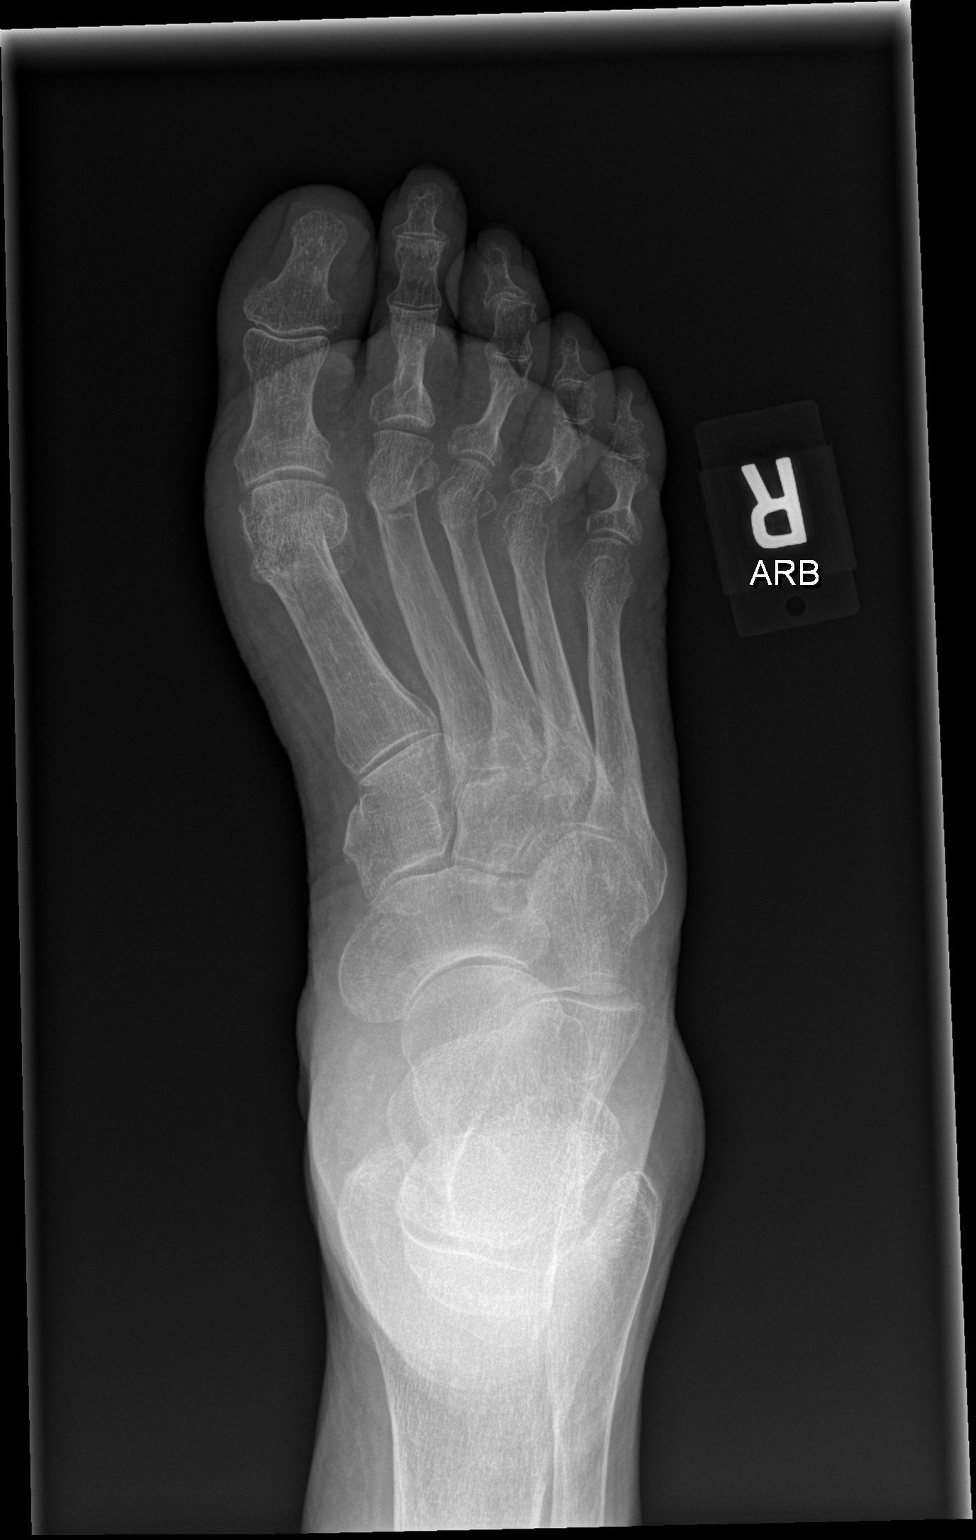
[im 2/3]
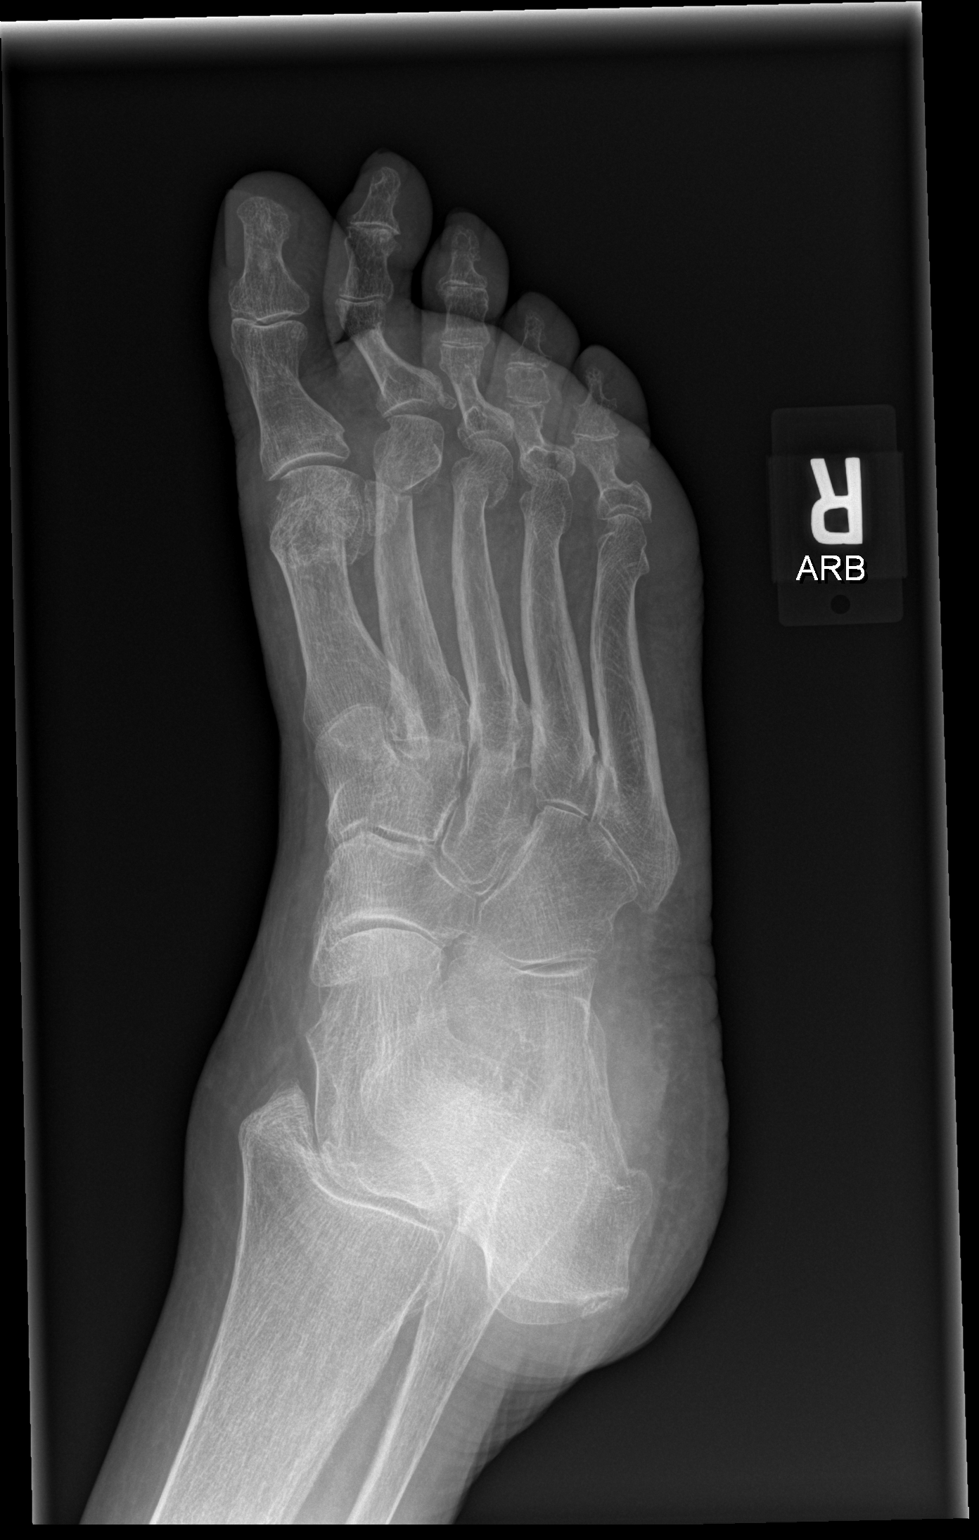
[im 3/3]
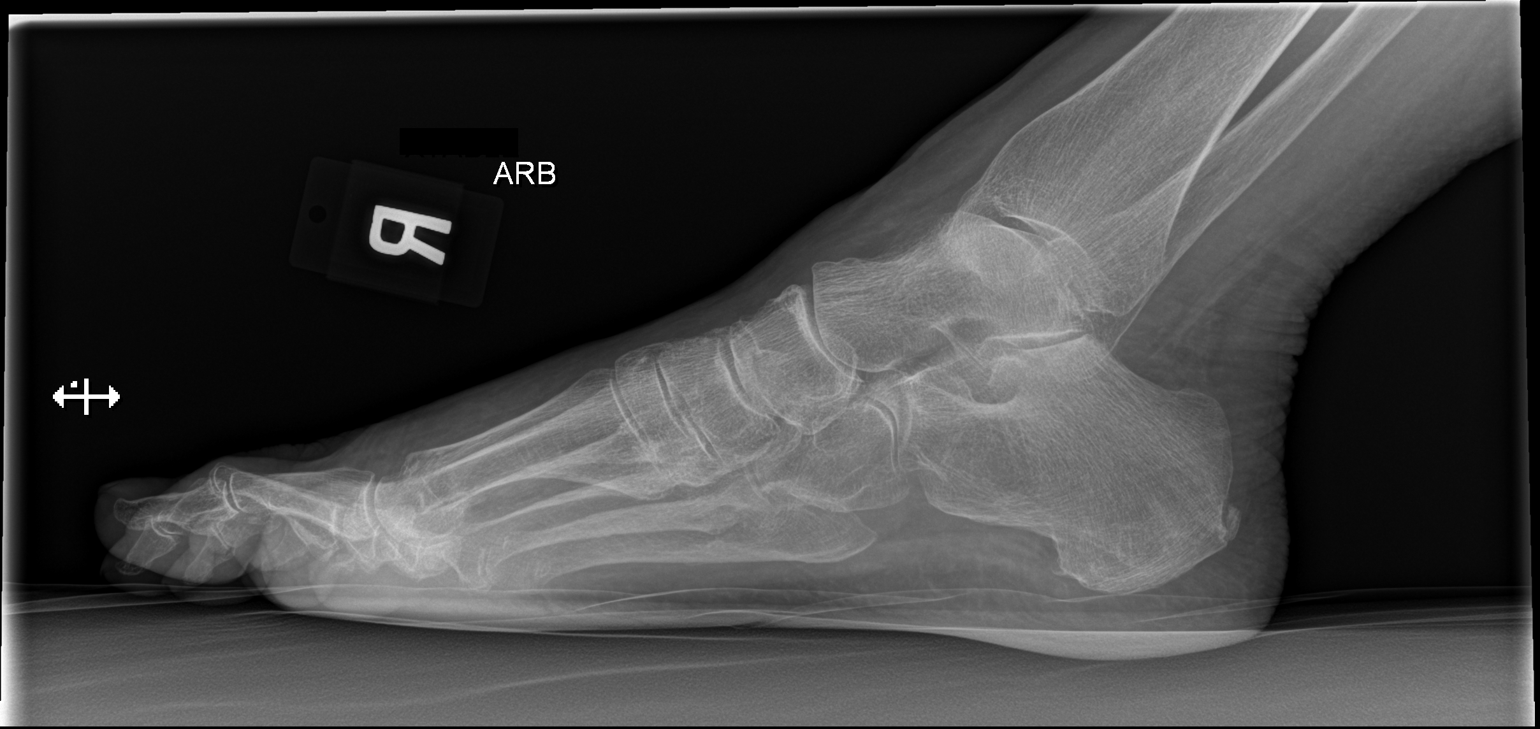

[3 of 3 positions shown; findings below may reference images not displayed]

FINDINGS: There are acute minimally displaced and angulated fractures of the
right 2nd and 3rd metatarsal heads distally. Bones are osteopenic.
Degenerative changes of the right 1st MTP joint. No associated
subluxation or dislocation. Mild soft tissue swelling.
IMPRESSION: Acute minimally displaced right 2nd and 3rd metatarsal head
fractures

## 2015-01-02 ENCOUNTER — Institutional Professional Consult (permissible substitution): Payer: PRIVATE HEALTH INSURANCE | Admitting: Internal Medicine

## 2015-01-10 ENCOUNTER — Encounter: Payer: Self-pay | Admitting: Internal Medicine

## 2015-01-10 ENCOUNTER — Ambulatory Visit (INDEPENDENT_AMBULATORY_CARE_PROVIDER_SITE_OTHER): Payer: Medicare Other | Admitting: Internal Medicine

## 2015-01-10 VITALS — BP 120/80 | HR 72 | Temp 97.9°F | Ht 77.2 in | Wt 175.0 lb

## 2015-01-10 DIAGNOSIS — G4736 Sleep related hypoventilation in conditions classified elsewhere: Secondary | ICD-10-CM | POA: Diagnosis not present

## 2015-01-10 DIAGNOSIS — J4489 Other specified chronic obstructive pulmonary disease: Secondary | ICD-10-CM | POA: Insufficient documentation

## 2015-01-10 DIAGNOSIS — J449 Chronic obstructive pulmonary disease, unspecified: Secondary | ICD-10-CM

## 2015-01-10 MED ORDER — BUDESONIDE 0.25 MG/2ML IN SUSP: 0.2500 mg | Freq: Two times a day (BID) | RESPIRATORY_TRACT | Status: DC

## 2015-01-10 NOTE — Patient Instructions (Addendum)
Follow up with Dr. Dema SeverinMungal in 3 months - pulmonary function testing - Pulmicort nebulizer 0.25 mg/2 mL-1 ampule in the morning and 1 ampule in the evening. Please rinse and gargle after each use. -Continue albuterol nebulizer one at home for shortness of breath, wheezing, coughing spells. May use albuterol inhaler for same symptoms when not at home.

## 2015-01-10 NOTE — Progress Notes (Signed)
Date: 01/10/2015  MRN# [id number] Brittany Munoz 12-14-1927  Referring Physician:   [name] is a 81 y.o. old female seen in consultation for COPD, and transition of care from Dr. [name].  CC:  Chief Complaint  Patient presents with  . Advice Only    Pt transfering care from Dr. Meredeth Ide;She was not satisfied with his care. She has COPD with Empysema. She is on 2 L that is worn with exertion and at bedtime. She has no concerns with oxygen therapy at this time just received a new machine.    HPI:  Vision is a pleasant 81 year old female is seen in consultation today for COPD and transition of care from Dr. [name]. Patient currently states that she's not on any maintenance medications for her COPD, she uses albuterol inhaler 4-5 times per week, she also has albuterol nebulizer she uses on a daily basis. She states that her triggers for COPD are spoken rapid weather changes; mostly spring and fall. In the past to stop Pulmicort inhaler due to financial reasons probably about 1 year ago. In the last 3 years she's had at least 2 visits per year for COPD exacerbations/bronchitis for which she was treated with cough suppressants, IV steroids, antibiotics. When she does have COPD exacerbation her major symptoms are worsening cough, increased shortness of breath with increased O2 requirement. She currently uses a walker due to the osteoarthritis and right knee replacement. She has past medical history documented of COPD, pulmonary fibrosis, tachycardia, nocturnal hypoxemia. Patient is currently also on 2 L of supplemental oxygen. In the past she smoked 2 packs per day for 15 years. Patient currently has no pets. Patient has had secondhand smoke exposure for 20+ years from parents and husband.   Review of records showed the following. Last office visit by Dr. [name] on 09/19/2014:  Brittany Munoz is a 81 y.o. female presents to clinic for sob, out of her pulmicort, mild leg edema,  feels better when she takes her diuretics. She also complains of mid t-spine pain. She denies trauma, wheezing, cough, calf pain, bleeding, fever or chills.  Impression: 1. COPD. Stage II. No acute bronchospasm.Stable 2. Oxygen desaturation with activity on portable oxygen. 3. Nocturnal oxygen desaturation on oxygen supplementation. 4. Mild elevation in right ventricular systolic pressure. 5. Mild global left ventricular dysfunction. No signs of failure today.  Plan: Continue above copd regimen Continue portable oxygen Weight loss Following serial echo's Refill pulmicort Watch salt and fluid intake T-spine xrays Follow up in 5 months, return sooner if symptoms relapse  PMHX:   Past Medical History  Diagnosis Date  . Asthma   . COPD (chronic obstructive pulmonary disease)   . Anxiety   . Depression   . Hypertension   . Degenerative arthritis   . Nocturnal hypoxia   . Seasonal allergies   . Hyperlipidemia   . Diabetes   . Gout   . Shingles   . Hemorrhoids   . Cataract   . Osteoporosis   . Pulmonary fibrosis   . Eczema   . Cardiomyopathy    Surgical Hx:  Past Surgical History  Procedure Laterality Date  . Cardiac catheterization    . Broken ankle    . Repair rotator cuff tear    . Cholecystectomy    . Tonsillectomy and adenoidectomy    . Mouth surgery    . Wisdom tooth removal    . Breast cyst removal    . Bladder repair    . Knee  arthroscopy    . Joint replacement    . Skin cancer removed      RT KNEE AND FOREHEAD  . Open reduction internal fixation (orif) distal radial fracture     Family Hx:  Family History  Problem Relation Age of Onset  . Hypertension Mother   . Asthma Mother   . COPD Mother   . Arthritis Mother   . Colon cancer Father   . Arthritis Sister    Social Hx:   History  Substance Use Topics  . Smoking status: Former Smoker -- 2.00 packs/day for 25 years    Types: Cigarettes    Quit date: 12/16/1963  . Smokeless tobacco: Never Used   . Alcohol Use: No   Medication:   Current Outpatient Rx  Name  Route  Sig  Dispense  Refill  . Acetaminophen 500 MG coapsule   Oral   Take 500 mg by mouth 2 (two) times daily.         [name]. albuterol (PROAIR HFA) 108 (90 BASE) MCG/ACT inhaler   Inhalation   Inhale 90 mcg into the lungs every 4 (four) hours as needed.         [name]. amLODipine (NORVASC) 10 MG tablet   Oral   Take 10 mg by mouth daily.         . budesonide (PULMICORT) 180 MCG/ACT inhaler   Inhalation   Inhale 180 mcg into the lungs 2 (two) times daily.         . carvedilol (COREG) 3.125 MG tablet   Oral   Take 1 tablet by mouth daily.         . Cholecalciferol (D-5000) 5000 UNITS TABS   Oral   Take 5,000 mg by mouth daily.         . DULoxetine (CYMBALTA) 60 MG capsule   Oral   Take 60 mg by mouth daily.         [name]. guaiFENesin (MUCINEX) 600 MG 12 hr tablet   Oral   Take 600 mg by mouth 2 times daily at 12 noon and 4 pm.         . HYDROcodone-acetaminophen (NORCO/VICODIN) 5-325 MG per tablet   Oral   Take 1 tablet by mouth as needed.         . lidocaine (LIDODERM) 5 %   Transdermal   Place 1 patch onto the skin every 12 (twelve) hours as needed.         [name]. LORazepam (ATIVAN) 1 MG tablet   Oral   Take 1 mg by mouth every 8 (eight) hours as needed.         . metoprolol succinate (TOPROL-XL) 25 MG 24 hr tablet   Oral   Take 25 mg by mouth daily.         . montelukast (SINGULAIR) 10 MG tablet   Oral   Take 10 mg by mouth daily.         [name]. omeprazole (PRILOSEC) 40 MG capsule   Oral   Take 40 mg by mouth daily.         [name]. torsemide (DEMADEX) 20 MG tablet   Oral   Take 20 mg by mouth daily.         . Vitamin D, Ergocalciferol, (DRISDOL) 50000 UNITS CAPS capsule   Oral   Take 1 capsule by mouth once a week.         . budesonide (PULMICORT) 0.25 MG/2ML nebulizer solution   Nebulization  Take 2 mLs (0.25 mg total) by nebulization 2 (two) times daily.   60 mL   10        Allergies:  Aspirin; Moxifloxacin hcl in nacl; Oxycodone; Sulfamethoxazole-trimethoprim; and Tramadol  Review of Systems: Gen:  Denies  fever, sweats, chills HEENT: Denies blurred vision, double vision, ear pain, eye pain, hearing loss, nose bleeds, sore throat Cvc:  No dizziness, chest pain or heaviness Resp:   Denies cough or sputum porduction, shortness of breath Gi: Denies swallowing difficulty, stomach pain, nausea or vomiting, diarrhea, constipation, bowel incontinence Gu:  Denies bladder incontinence, burning urine Ext:   No Joint pain, stiffness or swelling Skin: No skin rash, easy bruising or bleeding or hives Endoc:  No polyuria, polydipsia , polyphagia or weight change Psych: No depression, insomnia or hallucinations  Other:  All other systems negative  Physical Examination:   VS: BP 120/80 mmHg  Pulse 72  Temp(Src) 97.9 F (36.6 C) (Oral)  Ht 6' 5.2" (1.961 m)  Wt 175 lb (79.379 kg)  BMI 20.64 kg/m2  SpO2 97%  General Appearance: No distress  Neuro:without focal findings, mental status, speech normal, alert and oriented, cranial nerves 2-12 intact, reflexes normal and symmetric, sensation grossly normal  HEENT: PERRLA, EOM intact, no ptosis, no other lesions noticed; Mallampati 2 Pulmonary: bilateral dry crackles from the mid to lower fields. fair respiratory effort.no use of accessory muscles. CardiovascularNormal S1,S2.  No m/r/g.  Abdominal aorta pulsation normal.    Abdomen: Benign, Soft, non-tender, No masses, hepatosplenomegaly, No lymphadenopathy Renal:  No costovertebral tenderness  GU:  No performed at this time. Endoc: No evident thyromegaly, no signs of acromegaly or Cushing features Skin:   warm, no rashes, no ecchymosis  Extremities: normal, no cyanosis, clubbing, warm with normal capillary refill. Other findings:mild left leg swelling     ECHO 07/2014:   INTERPRETATION Closest EF: 30% (Estimated) Contraction: MOD GLOBAL DECREASE Size:  MODERATELY ENLARGED Tricuspid: MILD TR Mitral: MODERATE MR      Assessment and Plan:81 year old female past medical history of COPD, transitioning care to [facility].  COPD, moderate Patient with known history of COPD. Review of records show that she has COPD stage II. Currently she is in stable condition. At this time we'll attempt to provide patient assistance and order Pulmicort nebulizers, I believe being on a maintenance nebulizer maybe beneficial to the patient.  Plan: - pulmonary function testing - Pulmicort nebulizer 0.25 mg/2 mL-1 ampule in the morning and 1 ampule in the evening. Please rinse and gargle after each use. -Continue albuterol nebulizer one at home for shortness of breath, wheezing, coughing spells. May use albuterol inhaler for same symptoms when not at home.  Nocturnal hypoxemia due to obstructive chronic bronchitis Patient with known and documented nocturnal hypoxemia.  Plan: -Obtain documentation and records from previous pulmonologist, if unable to will plan felt overnight pulse oximetry at next visit. -See plan for COPD.    Updated Medication List Outpatient Encounter Prescriptions as of 01/10/2015  Medication Sig  . Acetaminophen 500 MG coapsule Take 500 mg by mouth 2 (two) times daily.  [name] albuterol (PROAIR HFA) 108 (90 BASE) MCG/ACT inhaler Inhale 90 mcg into the lungs every 4 (four) hours as needed.  [name] amLODipine (NORVASC) 10 MG tablet Take 10 mg by mouth daily.  . budesonide (PULMICORT) 180 MCG/ACT inhaler Inhale 180 mcg into the lungs 2 (two) times daily.  . carvedilol (COREG) 3.125 MG tablet Take 1 tablet by mouth daily.  . Cholecalciferol (D-5000) 5000 UNITS  TABS Take 5,000 mg by mouth daily.  . DULoxetine (CYMBALTA) 60 MG capsule Take 60 mg by mouth daily.  [name] guaiFENesin (MUCINEX) 600 MG 12 hr tablet Take 600 mg by mouth 2 times daily at 12 noon and 4 pm.  . HYDROcodone-acetaminophen (NORCO/VICODIN) 5-325 MG per tablet Take 1 tablet by  mouth as needed.  . lidocaine (LIDODERM) 5 % Place 1 patch onto the skin every 12 (twelve) hours as needed.  [name] LORazepam (ATIVAN) 1 MG tablet Take 1 mg by mouth every 8 (eight) hours as needed.  . metoprolol succinate (TOPROL-XL) 25 MG 24 hr tablet Take 25 mg by mouth daily.  . montelukast (SINGULAIR) 10 MG tablet Take 10 mg by mouth daily.  [name] omeprazole (PRILOSEC) 40 MG capsule Take 40 mg by mouth daily.  [name] torsemide (DEMADEX) 20 MG tablet Take 20 mg by mouth daily.  . Vitamin D, Ergocalciferol, (DRISDOL) 50000 UNITS CAPS capsule Take 1 capsule by mouth once a week.  . budesonide (PULMICORT) 0.25 MG/2ML nebulizer solution Take 2 mLs (0.25 mg total) by nebulization 2 (two) times daily.   No facility-administered encounter medications on file as of 01/10/2015.    Orders for this visit: No orders of the defined types were placed in this encounter.     Thank  you for the consultation and for allowing [facility] to assist in the care of your patient. Our recommendations are noted above.  Please contact us if we can be of further service.   Stephanie Acre, MD [facility] Office Number: [phone]

## 2015-01-17 NOTE — Assessment & Plan Note (Signed)
Patient with known history of COPD. Review of records show that she has COPD stage II. Currently she is in stable condition. At this time we'll attempt to provide patient assistance and order Pulmicort nebulizers, I believe being on a maintenance nebulizer maybe beneficial to the patient.  Plan: - pulmonary function testing - Pulmicort nebulizer 0.25 mg/2 mL-1 ampule in the morning and 1 ampule in the evening. Please rinse and gargle after each use. -Continue albuterol nebulizer one at home for shortness of breath, wheezing, coughing spells. May use albuterol inhaler for same symptoms when not at home.

## 2015-01-17 NOTE — Assessment & Plan Note (Signed)
Patient with known and documented nocturnal hypoxemia.  Plan: -Obtain documentation and records from previous pulmonologist, if unable to will plan felt overnight pulse oximetry at next visit. -See plan for COPD.

## 2015-05-23 ENCOUNTER — Encounter: Payer: Self-pay | Admitting: Internal Medicine

## 2015-06-15 ENCOUNTER — Ambulatory Visit: Payer: PRIVATE HEALTH INSURANCE | Admitting: Internal Medicine

## 2015-07-05 ENCOUNTER — Ambulatory Visit: Payer: PRIVATE HEALTH INSURANCE | Admitting: Internal Medicine

## 2015-09-11 ENCOUNTER — Ambulatory Visit: Payer: PRIVATE HEALTH INSURANCE | Admitting: Internal Medicine

## 2015-09-27 ENCOUNTER — Ambulatory Visit (INDEPENDENT_AMBULATORY_CARE_PROVIDER_SITE_OTHER): Payer: Medicare Other | Admitting: Internal Medicine

## 2015-09-27 ENCOUNTER — Encounter: Payer: Self-pay | Admitting: Internal Medicine

## 2015-09-27 VITALS — BP 132/80 | HR 100 | Ht 68.0 in | Wt 179.4 lb

## 2015-09-27 DIAGNOSIS — J449 Chronic obstructive pulmonary disease, unspecified: Secondary | ICD-10-CM

## 2015-09-27 DIAGNOSIS — G4736 Sleep related hypoventilation in conditions classified elsewhere: Secondary | ICD-10-CM | POA: Diagnosis not present

## 2015-09-27 MED ORDER — IPRATROPIUM-ALBUTEROL 0.5-2.5 (3) MG/3ML IN SOLN
6.0000 mL | Freq: Once | RESPIRATORY_TRACT | Status: AC
  Administered 2015-09-27: 6 mL via RESPIRATORY_TRACT

## 2015-09-27 MED ORDER — BUDESONIDE 0.25 MG/2ML IN SUSP: 0.2500 mg | Freq: Two times a day (BID) | RESPIRATORY_TRACT | Status: DC

## 2015-09-27 MED ORDER — ALBUTEROL SULFATE HFA 108 (90 BASE) MCG/ACT IN AERS: 2.0000 | INHALATION_SPRAY | RESPIRATORY_TRACT | Status: DC | PRN

## 2015-09-27 NOTE — Assessment & Plan Note (Signed)
At today's visit this seems to be some confusion about her pulmonary medications. We have decided to stop Pulmicort inhaler due to cost. Resume Pulmicort nebulizers twice a day, continue with albuterol inhaler as needed for shortness of breath/wheezing/recurrent cough Pulmonary function testing prior to next visit, if able to perform. Otherwise, we'll perform spirometry. Continue with supplemental oxygen with exertion and at night, 2 L.

## 2015-09-27 NOTE — Assessment & Plan Note (Addendum)
Patient with known and documented nocturnal hypoxemia.  Plan: -Continue with supplemental oxygen at night -See plan for COPD.     

## 2015-09-27 NOTE — Progress Notes (Signed)
Date: 09/27/2015  MRN# [id number] Brittany Munoz 1927-09-12  Referring Physician:   CLOTILDE Munoz is a 82 y.o. old female seen in consultation for COPD, and transition of care from Brittany Munoz.  CC:  Chief Complaint  Patient presents with  . Follow-up    pt. states breathing has wrosen. increased SOB. dry cough. wheezing. occ. chest pain/tightness.    HPI:  Vision is a pleasant 82 year old female is seen in consultation today for COPD and transition of care from Brittany Munoz. Patient currently states that she's not on any maintenance medications for her COPD, she uses albuterol inhaler 4-5 times per week, she also has albuterol nebulizer she uses on a daily basis. She states that her triggers for COPD are spoken rapid weather changes; mostly spring and fall. In the past to stop Pulmicort inhaler due to financial reasons probably about 1 year ago. In the last 3 years she's had at least 2 visits per year for COPD exacerbations/bronchitis for which she was treated with cough suppressants, IV steroids, antibiotics. When she does have COPD exacerbation her major symptoms are worsening cough, increased shortness of breath with increased O2 requirement. She currently uses a walker due to the osteoarthritis and right knee replacement. She has past medical history documented of COPD, pulmonary fibrosis, tachycardia, nocturnal hypoxemia. Patient is currently also on 2 L of supplemental oxygen. In the past she smoked 2 packs per day for 15 years. Patient currently has no pets. Patient has had secondhand smoke exposure for 20+ years from parents and husband.   Events since last clinic visit: Patient presented today for follow-up of her COPD, stage II. She states that she has worsening shortness of breath, with nonproductive cough and occasional chest tightness or wheezing . Past 2-3 days. She's currently on 2 L of these a cannula. She is accompanied today by her Brittany Munoz. Patient was  supposed to be on Pulmicort nebulizers twice a day since her last visit, but seems to be confused or unaware about this today. She states that she has not been getting a Pulmicort nebulizers twice a day. She also had a Pulmicort inhaler ascription, which she stated she could not afford due to it being $200 per month. Today she was noted to have decreased breath sounds at the bilateral bases, was given 2 DuoNeb treatments.   PMHX:   Past Medical History  Diagnosis Date  . Asthma   . COPD (chronic obstructive pulmonary disease) (HCC)   . Anxiety   . Depression   . Hypertension   . Degenerative arthritis   . Nocturnal hypoxia   . Seasonal allergies   . Hyperlipidemia   . Diabetes (HCC)   . Gout   . Shingles   . Hemorrhoids   . Cataract   . Osteoporosis   . Pulmonary fibrosis (HCC)   . Eczema   . Cardiomyopathy [facility])    Surgical Hx:  Past Surgical History  Procedure Laterality Date  . Cardiac catheterization    . Broken ankle    . Repair rotator cuff tear    . Cholecystectomy    . Tonsillectomy and adenoidectomy    . Mouth surgery    . Wisdom tooth removal    . Breast cyst removal    . Bladder repair    . Knee arthroscopy    . Joint replacement    . Skin cancer removed      RT KNEE AND FOREHEAD  . Open reduction internal fixation (orif) distal radial fracture  Family Hx:  Family History  Problem Relation Age of Onset  . Hypertension Brittany Munoz   . Asthma Brittany Munoz   . COPD Brittany Munoz   . Arthritis Brittany Munoz   . Colon cancer Father   . Arthritis Sister    Social Hx:   Social History  Substance Use Topics  . Smoking status: Former Smoker -- 2.00 packs/day for 25 years    Types: Cigarettes    Quit date: 12/16/1963  . Smokeless tobacco: Never Used  . Alcohol Use: No   Medication:   Current Outpatient Rx  Name  Route  Sig  Dispense  Refill  . Acetaminophen 500 MG coapsule   Oral   Take 500 mg by mouth 2 (two) times daily.         [name] albuterol (PROAIR HFA) 108 (90  Base) MCG/ACT inhaler   Inhalation   Inhale 2 puffs into the lungs every 4 (four) hours as needed.   24 g   1   . amLODipine (NORVASC) 10 MG tablet   Oral   Take 10 mg by mouth daily.         . carvedilol (COREG) 3.125 MG tablet   Oral   Take 1 tablet by mouth daily.         . Cholecalciferol (D-5000) 5000 UNITS TABS   Oral   Take 5,000 mg by mouth daily.         . DULoxetine (CYMBALTA) 60 MG capsule   Oral   Take 60 mg by mouth daily.         [name] guaiFENesin (MUCINEX) 600 MG 12 hr tablet   Oral   Take 600 mg by mouth 2 times daily at 12 noon and 4 pm.         . lidocaine (LIDODERM) 5 %   Transdermal   Place 1 patch onto the skin every 12 (twelve) hours as needed. Reported on 09/27/2015         . LORazepam (ATIVAN) 1 MG tablet   Oral   Take 1 mg by mouth every 8 (eight) hours as needed.         . metoprolol succinate (TOPROL-XL) 25 MG 24 hr tablet   Oral   Take 25 mg by mouth daily.         . montelukast (SINGULAIR) 10 MG tablet   Oral   Take 10 mg by mouth daily.         [name] omeprazole (PRILOSEC) 40 MG capsule   Oral   Take 40 mg by mouth daily.         [name] torsemide (DEMADEX) 20 MG tablet   Oral   Take 20 mg by mouth daily.         . Vitamin D, Ergocalciferol, (DRISDOL) 50000 UNITS CAPS capsule   Oral   Take 1 capsule by mouth once a week.         . budesonide (PULMICORT) 0.25 MG/2ML nebulizer solution   Nebulization   Take 2 mLs (0.25 mg total) by nebulization 2 (two) times daily.   120 mL   6       Allergies:  Aspirin; Moxifloxacin hcl in nacl; Oxycodone; Sulfamethoxazole-trimethoprim; and Tramadol  Review of Systems: Gen:  Denies  fever, sweats, chills HEENT: Denies blurred vision, double vision, ear pain, eye pain, hearing loss, nose bleeds, sore throat Cvc:  No dizziness, chest pain or heaviness Resp:   Admits to cough, shortness of breath, wheezing over the past 2-3 days Gi:  Denies swallowing difficulty, stomach pain,  nausea or vomiting, diarrhea, constipation, bowel incontinence Gu:  Denies bladder incontinence, burning urine Ext:   No Joint pain, stiffness or swelling Skin: No skin rash, easy bruising or bleeding or hives Endoc:  No polyuria, polydipsia , polyphagia or weight change Psych: No depression, insomnia or hallucinations  Other:  All other systems negative  Physical Examination:   VS: BP 132/80 mmHg  Pulse 100  Ht  (1.727 m)  Wt 179 lb 6.4 oz (81.375 kg)  BMI 27.28 kg/m2  SpO2 95%  General Appearance: No distress  Neuro:without focal findings, mental status, speech normal, alert and oriented, cranial nerves 2-12 intact, reflexes normal and symmetric, sensation grossly normal  HEENT: PERRLA, EOM intact, no ptosis, no other lesions noticed; Mallampati 2 Pulmonary:  Prebronchodilator-moderate decrease in basilar breath sounds bilaterally, no significant wheezing no crackles Postbronchodilator-increased air movement especially at the bases, no wheezing, no crackles. CardiovascularNormal S1,S2.  No m/r/g.  Abdominal aorta pulsation normal.    Abdomen: Benign, Soft, non-tender, No masses, hepatosplenomegaly, No lymphadenopathy Renal:  No costovertebral tenderness  GU:  No performed at this time. Endoc: No evident thyromegaly, no signs of acromegaly or Cushing features Skin:   warm, no rashes, no ecchymosis  Extremities: normal, no cyanosis, clubbing, warm with normal capillary refill. Other findings:mild left leg swelling     ECHO 07/2014:   INTERPRETATION Closest EF: 30% (Estimated) Contraction: MOD GLOBAL DECREASE Size: MODERATELY ENLARGED Tricuspid: MILD TR Mitral: MODERATE MR      Assessment and Plan:82 year old female past medical history of COPD, transitioning care to [place] pulmonary.  COPD, moderate At today's visit this seems to be some confusion about her pulmonary medications. We have decided to stop Pulmicort inhaler due to cost. Resume Pulmicort nebulizers  twice a day, continue with albuterol inhaler as needed for shortness of breath/wheezing/recurrent cough Pulmonary function testing prior to next visit, if able to perform. Otherwise, we'll perform spirometry. Continue with supplemental oxygen with exertion and at night, 2 L.  Nocturnal hypoxemia due to obstructive chronic bronchitis Patient with known and documented nocturnal hypoxemia.  Plan: -Continue with supplemental oxygen at night -See plan for COPD.      Updated Medication List Outpatient Encounter Prescriptions as of 09/27/2015  Medication Sig  . Acetaminophen 500 MG coapsule Take 500 mg by mouth 2 (two) times daily.  [name] albuterol (PROAIR HFA) 108 (90 Base) MCG/ACT inhaler Inhale 2 puffs into the lungs every 4 (four) hours as needed.  [name] amLODipine (NORVASC) 10 MG tablet Take 10 mg by mouth daily.  . carvedilol (COREG) 3.125 MG tablet Take 1 tablet by mouth daily.  . Cholecalciferol (D-5000) 5000 UNITS TABS Take 5,000 mg by mouth daily.  . DULoxetine (CYMBALTA) 60 MG capsule Take 60 mg by mouth daily.  [name] guaiFENesin (MUCINEX) 600 MG 12 hr tablet Take 600 mg by mouth 2 times daily at 12 noon and 4 pm.  . lidocaine (LIDODERM) 5 % Place 1 patch onto the skin every 12 (twelve) hours as needed. Reported on 09/27/2015  . LORazepam (ATIVAN) 1 MG tablet Take 1 mg by mouth every 8 (eight) hours as needed.  . metoprolol succinate (TOPROL-XL) 25 MG 24 hr tablet Take 25 mg by mouth daily.  . montelukast (SINGULAIR) 10 MG tablet Take 10 mg by mouth daily.  [name] omeprazole (PRILOSEC) 40 MG capsule Take 40 mg by mouth daily.  [name] torsemide (DEMADEX) 20 MG tablet Take 20 mg by mouth daily.  . Vitamin D, Ergocalciferol, (  DRISDOL) 50000 UNITS CAPS capsule Take 1 capsule by mouth once a week.  . [DISCONTINUED] albuterol (PROAIR HFA) 108 (90 BASE) MCG/ACT inhaler Inhale 2 puffs into the lungs every 4 (four) hours as needed.   . budesonide (PULMICORT) 0.25 MG/2ML nebulizer solution Take 2 mLs (0.25 mg  total) by nebulization 2 (two) times daily.  . [DISCONTINUED] budesonide (PULMICORT) 0.25 MG/2ML nebulizer solution Take 2 mLs (0.25 mg total) by nebulization 2 (two) times daily. (Patient not taking: Reported on 09/27/2015)  . [DISCONTINUED] budesonide (PULMICORT) 180 MCG/ACT inhaler Inhale 180 mcg into the lungs 2 (two) times daily. Reported on 09/27/2015  . [DISCONTINUED] HYDROcodone-acetaminophen (NORCO/VICODIN) 5-325 MG per tablet Take 1 tablet by mouth as needed. Reported on 09/27/2015  . [EXPIRED] ipratropium-albuterol (DUONEB) 0.5-2.5 (3) MG/3ML nebulizer solution 6 mL    No facility-administered encounter medications on file as of 09/27/2015.    Orders for this visit: Orders Placed This Encounter  Procedures  . AMB REFERRAL FOR DME    Referral Priority:  Routine    Referral Type:  Durable Medical Equipment Purchase    Number of Visits Requested:  1  . Pulmonary function test    Standing Status: Future     Number of Occurrences:      Standing Expiration Date: 09/26/2016    Scheduling Instructions:     If unable to perform. have spirometry prior to next OV.    Order Specific Question:  Where should this test be performed?    Answer:  [facility]    Order Specific Question:  Full PFT: includes the following: basic spirometry, spirometry pre & post bronchodilator, diffusion capacity (DLCO), lung volumes    Answer:  Full PFT     Thank  you for the consultation and for allowing [facility] to assist in the care of your patient. Our recommendations are noted above.  Please contact us if we can be of further service.   Stephanie Acre, MD [facility] Office Number: [phone]

## 2015-09-27 NOTE — Patient Instructions (Addendum)
Follow up with Dr. Dema Severin in: 3 months - We will DC your Pulmicort inhaler due to financial reasons -We will continue Pulmicort nebulizer-1 treatment in the morning and 1 treatment in the afternoon, gargle and rinse after each treatment - albuterol inhaler - 2puff every 3-4 hours as needed for shortness of breath\wheezing\recurrent cough - PFTs prior to next visit, if able to perform.  Otherwise, spirometry prior to next visit.  - continue with your oxygen with exertion and at night.

## 2015-10-08 DIAGNOSIS — R739 Hyperglycemia, unspecified: Secondary | ICD-10-CM | POA: Insufficient documentation

## 2015-10-08 DIAGNOSIS — R1031 Right lower quadrant pain: Secondary | ICD-10-CM | POA: Insufficient documentation

## 2015-12-18 ENCOUNTER — Ambulatory Visit (INDEPENDENT_AMBULATORY_CARE_PROVIDER_SITE_OTHER): Payer: Medicare Other | Admitting: *Deleted

## 2015-12-18 DIAGNOSIS — J449 Chronic obstructive pulmonary disease, unspecified: Secondary | ICD-10-CM | POA: Diagnosis not present

## 2015-12-18 LAB — PULMONARY FUNCTION TEST
DL/VA % PRED: 81 %
DL/VA: 4.21 ml/min/mmHg/L
DLCO UNC % PRED: 251 %
DLCO UNC: 71.31 ml/min/mmHg
FEF 25-75 PRE: 0.34 L/s
FEF 25-75 Post: 0.43 L/sec
FEF2575-%Change-Post: 28 %
FEF2575-%Pred-Post: 33 %
FEF2575-%Pred-Pre: 26 %
FEV1-%CHANGE-POST: 8 %
FEV1-%Pred-Post: 43 %
FEV1-%Pred-Pre: 39 %
FEV1-POST: 0.87 L
FEV1-Pre: 0.8 L
FEV1FVC-%Change-Post: 3 %
FEV1FVC-%Pred-Pre: 75 %
FEV6-%CHANGE-POST: 4 %
FEV6-%PRED-POST: 59 %
FEV6-%Pred-Pre: 57 %
FEV6-POST: 1.53 L
FEV6-PRE: 1.47 L
FEV6FVC-%CHANGE-POST: 0 %
FEV6FVC-%PRED-POST: 106 %
FEV6FVC-%PRED-PRE: 106 %
FVC-%Change-Post: 4 %
FVC-%PRED-POST: 56 %
FVC-%Pred-Pre: 53 %
FVC-Post: 1.54 L
FVC-Pre: 1.47 L
POST FEV6/FVC RATIO: 100 %
PRE FEV6/FVC RATIO: 100 %
Post FEV1/FVC ratio: 57 %
Pre FEV1/FVC ratio: 55 %

## 2015-12-18 NOTE — Progress Notes (Signed)
PFT performed today with Nitrogen washout. 

## 2015-12-19 ENCOUNTER — Ambulatory Visit: Payer: Medicare Other | Admitting: Internal Medicine

## 2015-12-20 ENCOUNTER — Ambulatory Visit: Payer: Medicare Other | Admitting: Internal Medicine

## 2015-12-24 ENCOUNTER — Encounter (INDEPENDENT_AMBULATORY_CARE_PROVIDER_SITE_OTHER): Payer: Self-pay

## 2016-01-15 ENCOUNTER — Ambulatory Visit (INDEPENDENT_AMBULATORY_CARE_PROVIDER_SITE_OTHER): Payer: Medicare Other | Admitting: Internal Medicine

## 2016-01-15 ENCOUNTER — Other Ambulatory Visit: Payer: Self-pay | Admitting: Internal Medicine

## 2016-01-15 ENCOUNTER — Encounter: Payer: Self-pay | Admitting: Internal Medicine

## 2016-01-15 VITALS — BP 146/90 | HR 100 | Ht 68.0 in | Wt 182.8 lb

## 2016-01-15 DIAGNOSIS — G4736 Sleep related hypoventilation in conditions classified elsewhere: Secondary | ICD-10-CM

## 2016-01-15 DIAGNOSIS — J449 Chronic obstructive pulmonary disease, unspecified: Secondary | ICD-10-CM

## 2016-01-15 NOTE — Progress Notes (Signed)
Date: 01/15/2016  MRN# [id number] Brittany Munoz [birth year]  Referring Physician:   ALLEX Munoz is a 82 y.o. old female seen in consultation for COPD, and transition of care from Dr. Mayo Munoz.  CC:  Chief Complaint  Patient presents with  . Follow-up    HPI:  Vision is a pleasant 82 year old female is seen in consultation today for COPD and transition of care from Dr. Mayo Munoz. Patient currently states that she's not on any maintenance medications for her COPD, she uses albuterol inhaler 4-5 times per week, she also has albuterol nebulizer she uses on a daily basis. She states that her triggers for COPD are spoken rapid weather changes; mostly spring and fall. In the past to stop Pulmicort inhaler due to financial reasons probably about 1 year ago. In the last 3 years she's had at least 2 visits per year for COPD exacerbations/bronchitis for which she was treated with cough suppressants, IV steroids, antibiotics. When she does have COPD exacerbation her major symptoms are worsening cough, increased shortness of breath with increased O2 requirement. She currently uses a walker due to the osteoarthritis and right knee replacement. She has past medical history documented of COPD, pulmonary fibrosis, tachycardia, nocturnal hypoxemia. Patient is currently also on 2 L of supplemental oxygen. In the past she smoked 2 packs per day for 15 years. Patient currently has no pets. Patient has had secondhand smoke exposure for 20+ years from parents and husband.   Events since last clinic visit: Patient presented today for follow-up of her COPD, stage II. She states that she still has intermittent wheezing and dry cough. At her last visit she was advised to use Pulmicort twice a day, she's only been using it once a day, along with Ventolin 2-3 times per day. Today again she is accompanied by her sister-in-law. There seems to be some confusion as to her nebulizer regiment and rescue inhaler  regimen. Today we went over in detail again her nebulizer regiment which is Pulmicort twice a day, and albuterol as needed every 3-4 hours. She is advised again if Pulmicort twice a day is causing worsening shortness of breath, cough, nausea, dry throat, she is to stop twice a day regiment and call us for an alternative nebulizer.   PMHX:   Past Medical History  Diagnosis Date  . Asthma   . COPD (chronic obstructive pulmonary disease) (HCC)   . Anxiety   . Depression   . Hypertension   . Degenerative arthritis   . Nocturnal hypoxia   . Seasonal allergies   . Hyperlipidemia   . Diabetes (HCC)   . Gout   . Shingles   . Hemorrhoids   . Cataract   . Osteoporosis   . Pulmonary fibrosis (HCC)   . Eczema   . Cardiomyopathy [facility])    Surgical Hx:  Past Surgical History  Procedure Laterality Date  . Cardiac catheterization    . Broken ankle    . Repair rotator cuff tear    . Cholecystectomy    . Tonsillectomy and adenoidectomy    . Mouth surgery    . Wisdom tooth removal    . Breast cyst removal    . Bladder repair    . Knee arthroscopy    . Joint replacement    . Skin cancer removed      RT KNEE AND FOREHEAD  . Open reduction internal fixation (orif) distal radial fracture     Family Hx:  Family History  Problem Relation Age  of Onset  . Hypertension Mother   . Asthma Mother   . COPD Mother   . Arthritis Mother   . Colon cancer Father   . Arthritis Sister    Social Hx:   Social History  Substance Use Topics  . Smoking status: Former Smoker -- 2.00 packs/day for 25 years    Types: Cigarettes    Quit date: 12/16/1963  . Smokeless tobacco: Never Used  . Alcohol Use: No   Medication:   Current Outpatient Rx  Name  Route  Sig  Dispense  Refill  . Acetaminophen 500 MG coapsule   Oral   Take 500 mg by mouth 2 (two) times daily.         [name] albuterol (PROAIR HFA) 108 (90 Base) MCG/ACT inhaler   Inhalation   Inhale 2 puffs into the lungs every 4 (four) hours as  needed.   24 g   1   . amLODipine (NORVASC) 10 MG tablet   Oral   Take 10 mg by mouth daily.         . budesonide (PULMICORT) 0.25 MG/2ML nebulizer solution   Nebulization   Take 2 mLs (0.25 mg total) by nebulization 2 (two) times daily.   120 mL   6   . carvedilol (COREG) 3.125 MG tablet   Oral   Take 1 tablet by mouth daily.         . Cholecalciferol (D-5000) 5000 UNITS TABS   Oral   Take 5,000 mg by mouth daily.         . DULoxetine (CYMBALTA) 60 MG capsule   Oral   Take 60 mg by mouth daily.         [name] guaiFENesin (MUCINEX) 600 MG 12 hr tablet   Oral   Take 600 mg by mouth 2 times daily at 12 noon and 4 pm.         . lidocaine (LIDODERM) 5 %   Transdermal   Place 1 patch onto the skin every 12 (twelve) hours as needed. Reported on 09/27/2015         . LORazepam (ATIVAN) 1 MG tablet   Oral   Take 1 mg by mouth every 8 (eight) hours as needed.         [name] EXPIRED: metoprolol succinate (TOPROL-XL) 25 MG 24 hr tablet   Oral   Take 25 mg by mouth daily.         . montelukast (SINGULAIR) 10 MG tablet   Oral   Take 10 mg by mouth daily.         [name] omeprazole (PRILOSEC) 40 MG capsule   Oral   Take 40 mg by mouth daily.         [name] torsemide (DEMADEX) 20 MG tablet   Oral   Take 20 mg by mouth daily.         . Vitamin D, Ergocalciferol, (DRISDOL) 50000 UNITS CAPS capsule   Oral   Take 1 capsule by mouth once a week.             Allergies:  Aspirin; Moxifloxacin hcl in nacl; Oxycodone; Sulfamethoxazole-trimethoprim; and Tramadol  Review of Systems: Gen:  Denies  fever, sweats, chills HEENT: Denies blurred vision, double vision, ear pain, eye pain, hearing loss, nose bleeds, sore throat Cvc:  No dizziness, chest pain or heaviness Resp:   Admits to cough, shortness of breath, Intermittent wheezing Gi: Denies swallowing difficulty, stomach pain, nausea or vomiting, diarrhea, constipation, bowel incontinence  Gu:  Denies bladder incontinence,  burning urine Ext:   No Joint pain, stiffness or swelling Skin: No skin rash, easy bruising or bleeding or hives Endoc:  No polyuria, polydipsia , polyphagia or weight change Psych: No depression, insomnia or hallucinations  Other:  All other systems negative  Physical Examination:   VS: BP 146/90 mmHg  Pulse 100  SpO2 95%  General Appearance: No distress  Neuro:without focal findings, mental status, speech normal, alert and oriented, cranial nerves 2-12 intact, reflexes normal and symmetric, sensation grossly normal  HEENT: PERRLA, EOM intact, no ptosis, no other lesions noticed; Mallampati 2 Pulmonary:  Prebronchodilator-moderate decrease in basilar breath sounds bilaterally, no significant wheezing no crackles Postbronchodilator-increased air movement especially at the bases, no wheezing, no crackles. CardiovascularNormal S1,S2.  No m/r/g.  Abdominal aorta pulsation normal.    Abdomen: Benign, Soft, non-tender, No masses, hepatosplenomegaly, No lymphadenopathy Renal:  No costovertebral tenderness  GU:  No performed at this time. Endoc: No evident thyromegaly, no signs of acromegaly or Cushing features Skin:   warm, no rashes, no ecchymosis  Extremities: normal, no cyanosis, clubbing, warm with normal capillary refill. Other findings:mild left leg swelling     ECHO 07/2014:   INTERPRETATION Closest EF: 30% (Estimated) Contraction: MOD GLOBAL DECREASE Size: MODERATELY ENLARGED Tricuspid: MILD TR Mitral: MODERATE MR  Pulmonary function testing 12/18/15 FEV1 39% FEV1/FVC 55% RV 67% TLC 60% RV/TLC 103 ERV 5 DLCO/VA 81% Interpretation-severe obstruction with no significant bronchodilator response, decreased lung volumes. DLCO uncorrected is severely elevated to 251, and believe that this is an error. Flow loops with expiratory scooping at end expiratory consistent with COPD/emphysema.   Assessment and Plan:82 year old female past medical history of COPD, transitioning care  to [facility] pulmonary.  Nocturnal hypoxemia due to obstructive chronic bronchitis Patient with known and documented nocturnal hypoxemia.  Plan: -Continue with supplemental oxygen at night -See plan for COPD.      COPD, moderate At today's visit this seems to be some confusion about her pulmonary medications. Pulmicort nebulizers twice a day, continue with albuterol inhaler as needed for shortness of breath/wheezing/recurrent cough Continue with supplemental oxygen with exertion and at night, 2 L. PFTs with severe obstruction, FEV1 39%, low lung volumes, falsely elevated DLCO.  Plan: -Pulmicort nebulizers twice a day, oxygen 2 L continuously -Advised twice a day of Pulmicort is causing any adverse effects to call us back for alternative -Continue with rescue inhaler as needed.     Updated Medication List Outpatient Encounter Prescriptions as of 01/15/2016  Medication Sig  . Acetaminophen 500 MG coapsule Take 500 mg by mouth 2 (two) times daily.  [name] albuterol (PROAIR HFA) 108 (90 Base) MCG/ACT inhaler Inhale 2 puffs into the lungs every 4 (four) hours as needed.  [name] amLODipine (NORVASC) 10 MG tablet Take 10 mg by mouth daily.  . budesonide (PULMICORT) 0.25 MG/2ML nebulizer solution Take 2 mLs (0.25 mg total) by nebulization 2 (two) times daily.  . carvedilol (COREG) 3.125 MG tablet Take 1 tablet by mouth daily.  . Cholecalciferol (D-5000) 5000 UNITS TABS Take 5,000 mg by mouth daily.  . DULoxetine (CYMBALTA) 60 MG capsule Take 60 mg by mouth daily.  [name] guaiFENesin (MUCINEX) 600 MG 12 hr tablet Take 600 mg by mouth 2 times daily at 12 noon and 4 pm.  . lidocaine (LIDODERM) 5 % Place 1 patch onto the skin every 12 (twelve) hours as needed. Reported on 09/27/2015  . LORazepam (ATIVAN) 1 MG tablet Take 1 mg by mouth every 8 (  eight) hours as needed.  . metoprolol succinate (TOPROL-XL) 25 MG 24 hr tablet Take 25 mg by mouth daily.  . montelukast (SINGULAIR) 10 MG tablet Take 10 mg by mouth  daily.  [name]. omeprazole (PRILOSEC) 40 MG capsule Take 40 mg by mouth daily.  [name]. torsemide (DEMADEX) 20 MG tablet Take 20 mg by mouth daily.  . Vitamin D, Ergocalciferol, (DRISDOL) 50000 UNITS CAPS capsule Take 1 capsule by mouth once a week.   No facility-administered encounter medications on file as of 01/15/2016.    Orders for this visit: No orders of the defined types were placed in this encounter.     Thank  you for the consultation and for allowing [facility] to assist in the care of your patient. Our recommendations are noted above.  Please contact us if we can be of further service.   Stephanie AcreVishal Sheridan Hew, MD [facility] Office Number: [phone]

## 2016-01-15 NOTE — Assessment & Plan Note (Signed)
Patient with known and documented nocturnal hypoxemia.  Plan: -Continue with supplemental oxygen at night -See plan for COPD.     

## 2016-01-15 NOTE — Assessment & Plan Note (Signed)
At today's visit this seems to be some confusion about her pulmonary medications. Pulmicort nebulizers twice a day, continue with albuterol inhaler as needed for shortness of breath/wheezing/recurrent cough Continue with supplemental oxygen with exertion and at night, 2 L. PFTs with severe obstruction, FEV1 39%, low lung volumes, falsely elevated DLCO.  Plan: -Pulmicort nebulizers twice a day, oxygen 2 L continuously -Advised twice a day of Pulmicort is causing any adverse effects to call us back for alternative -Continue with rescue inhaler as needed.

## 2016-01-15 NOTE — Patient Instructions (Signed)
Follow up with Dr. Dema SeverinMungal in: 6-8 weeks - Pulmicort nebulizers BID - gargle and rinse after each use.  - If Pulmicort nebulizers twice a day and causing adverse effects, coughing, dry throat, nausea, then cause bacteremia to switch you to another nebulizer that is within your financial means. - PFTs with severe obstruction (FEV1 39%)  - avoid any forms of smoking.

## 2016-03-14 ENCOUNTER — Encounter: Payer: Self-pay | Admitting: Internal Medicine

## 2016-03-14 ENCOUNTER — Ambulatory Visit (INDEPENDENT_AMBULATORY_CARE_PROVIDER_SITE_OTHER): Payer: Medicare Other | Admitting: Internal Medicine

## 2016-03-14 VITALS — BP 150/82 | HR 97 | Ht 68.0 in | Wt 180.0 lb

## 2016-03-14 DIAGNOSIS — G4736 Sleep related hypoventilation in conditions classified elsewhere: Secondary | ICD-10-CM | POA: Diagnosis not present

## 2016-03-14 DIAGNOSIS — J449 Chronic obstructive pulmonary disease, unspecified: Secondary | ICD-10-CM

## 2016-03-14 DIAGNOSIS — R0600 Dyspnea, unspecified: Secondary | ICD-10-CM

## 2016-03-14 MED ORDER — BUDESONIDE 0.25 MG/2ML IN SUSP: 0.2500 mg | Freq: Two times a day (BID) | RESPIRATORY_TRACT | 6 refills | Status: DC

## 2016-03-14 NOTE — Assessment & Plan Note (Signed)
Chronic dyspnea secondary to COPD, deconditioning, medical noncompliance.  Plan: -Continue with COPD optimization -2 view chest x-ray prior to follow-up visit

## 2016-03-14 NOTE — Assessment & Plan Note (Signed)
At today's visit this seems to be some confusion about her pulmonary medications. Pulmicort nebulizers twice a day, continue with albuterol inhaler as needed for shortness of breath/wheezing/recurrent cough I have spent 15 minutes explaining to the patient the use of Pulmicort nebulizer along with rescue inhaler. I also advised her that if she starts to have mouth sores with the Pulmicort nebulizer twice a day then she should back off and call us for an alternative. Her sister-in-law was present with her today Continue with supplemental oxygen with exertion and at night, 2 L. PFTs with severe obstruction, FEV1 39%, low lung volumes, falsely elevated DLCO.  Plan: -Pulmicort nebulizers twice a day, oxygen 2 L continuously -Advised twice a day of Pulmicort is causing any adverse effects to call us back for alternative -Continue with rescue inhaler as needed.

## 2016-03-14 NOTE — Patient Instructions (Addendum)
Follow up with Dr. Dema SeverinMungal in:3 months - Pulmicort nebulizers BID - gargle and rinse after each use.  - If Pulmicort nebulizers twice a day and causing adverse effects, coughing, dry throat, nausea, then cause bacteremia to switch you to another nebulizer that is within your financial means. - PFTs with severe obstruction (FEV1 39%)  - avoid any forms of smoking.  - 2 view CXR prior to follow up for COPD, dyspnea - we will refill your pulmicort nebulizer prescription.

## 2016-03-14 NOTE — Progress Notes (Signed)
Date: 03/14/2016  MRN# [id number] Brittany BonBetty L Milledge 08/21/1927  Referring Physician:   Berton BonBetty L Munoz is a 82 y.o. old female seen for COPD follow up. CC:  Chief Complaint  Patient presents with  . Follow-up    13mo rov. breathing is up & down. c/o sob w/exertion, non prod cough, wheezing & chest tightness. using pulmicort once daily & occ having to use proair 2-3 times daily.     Brief HPI:  82 year old female with COPD Stage II, pulmonary fibrosis, tachycardia, nocturnal hypoxemia. Patient is currently also on 2 L of supplemental oxygen.  Events since last clinic visit: Patient presented today for follow-up of her COPD, stage II. She states that she still has intermittent wheezing and dry cough. At her last visit she was advised to use Pulmicort twice a day, she's only been using it once a day, along with Ventolin 2-3 times per day, this has been a recurring event with her.  Today she is is accompanied by her sister-in-law. There seems to be some confusion as to her nebulizer regiment and rescue inhaler regimen, again Today we went over in detail again her nebulizer regiment which is Pulmicort twice a day, and albuterol as needed every 3-4 hours. She is advised again if Pulmicort twice a day is causing worsening shortness of breath, cough, nausea, dry throat, she is to stop twice a day regiment and call us for an alternative nebulizer. Had some wheezing two days ago, now resolved.    PMHX:   Past Medical History:  Diagnosis Date  . Anxiety   . Asthma   . Cardiomyopathy (HCC)   . Cataract   . COPD (chronic obstructive pulmonary disease) (HCC)   . Degenerative arthritis   . Depression   . Diabetes (HCC)   . Eczema   . Gout   . Hemorrhoids   . Hyperlipidemia   . Hypertension   . Nocturnal hypoxia   . Osteoporosis   . Pulmonary fibrosis (HCC)   . Seasonal allergies   . Shingles    Surgical Hx:  Past Surgical History:  Procedure Laterality Date  . BLADDER REPAIR    .  BREAST CYST REMOVAL    . BROKEN ANKLE    . CARDIAC CATHETERIZATION    . CHOLECYSTECTOMY    . JOINT REPLACEMENT    . KNEE ARTHROSCOPY    . MOUTH SURGERY    . OPEN REDUCTION INTERNAL FIXATION (ORIF) DISTAL RADIAL FRACTURE    . REPAIR ROTATOR CUFF TEAR    . SKIN CANCER REMOVED     RT KNEE AND FOREHEAD  . TONSILLECTOMY AND ADENOIDECTOMY    . WISDOM TOOTH REMOVAL     Family Hx:  Family History  Problem Relation Age of Onset  . Hypertension Mother   . Asthma Mother   . COPD Mother   . Arthritis Mother   . Colon cancer Father   . Arthritis Sister    Social Hx:   Social History  Substance Use Topics  . Smoking status: Former Smoker    Packs/day: 2.00    Years: 25.00    Types: Cigarettes    Quit date: 12/16/1963  . Smokeless tobacco: Never Used  . Alcohol use No   Medication:        Allergies:  Aspirin; Moxifloxacin hcl in nacl; Oxycodone; Sulfamethoxazole-trimethoprim; and Tramadol  Review of Systems: Gen:  Denies  fever, sweats, chills HEENT: Denies blurred vision, double vision, ear pain, eye pain, hearing loss, nose bleeds, sore throat Cvc:  No dizziness, chest pain or heaviness Resp:   Admits to cough, shortness of breath, Intermittent wheezing (baseline) Gi: Denies swallowing difficulty, stomach pain, nausea or vomiting, diarrhea, constipation, bowel incontinence Gu:  Denies bladder incontinence, burning urine Ext:   No Joint pain, stiffness or swelling Skin: No skin rash, easy bruising or bleeding or hives Endoc:  No polyuria, polydipsia , polyphagia or weight change Psych: No depression, insomnia or hallucinations  Other:  All other systems negative  Physical Examination:   VS: BP (!) 150/82 (BP Location: Left Arm, Cuff Size: Normal)   Pulse 97   Ht  (1.727 m)   Wt 180 lb (81.6 kg)   SpO2 92%   BMI 27.37 kg/m   General Appearance: No distress  Neuro:without focal findings, mental status, speech normal, alert and oriented, cranial nerves 2-12  intact, reflexes normal and symmetric, sensation grossly normal  HEENT: PERRLA, EOM intact, no ptosis, no other lesions noticed; Mallampati 2 Pulmonary: no wheezing, good lung sounds and movement, fine dry basilar crackles CardiovascularNormal S1,S2.  No m/r/g.  Abdominal aorta pulsation normal.    Abdomen: Benign, Soft, non-tender, No masses, hepatosplenomegaly, No lymphadenopathy Renal:  No costovertebral tenderness  GU:  No performed at this time. Endoc: No evident thyromegaly, no signs of acromegaly or Cushing features Skin:   warm, no rashes, no ecchymosis  Extremities: normal, no cyanosis, clubbing, warm with normal capillary refill. Other findings:mild left leg swelling     ECHO 07/2014:   INTERPRETATION Closest EF: 30% (Estimated) Contraction: MOD GLOBAL DECREASE Size: MODERATELY ENLARGED Tricuspid: MILD TR Mitral: MODERATE MR  Pulmonary function testing 12/18/15 FEV1 39% FEV1/FVC 55% RV 67% TLC 60% RV/TLC 103 ERV 5 DLCO/VA 81% Interpretation-severe obstruction with no significant bronchodilator response, decreased lung volumes. DLCO uncorrected is severely elevated to 251, and believe that this is an error. Flow loops with expiratory scooping at end expiratory consistent with COPD/emphysema.   Assessment and Plan:82 year old female past medical history of COPD, transitioning care to [facility] pulmonary.  COPD, moderate At today's visit this seems to be some confusion about her pulmonary medications. Pulmicort nebulizers twice a day, continue with albuterol inhaler as needed for shortness of breath/wheezing/recurrent cough I have spent 15 minutes explaining to the patient the use of Pulmicort nebulizer along with rescue inhaler. I also advised her that if she starts to have mouth sores with the Pulmicort nebulizer twice a day then she should back off and call us for an alternative. Her sister-in-law was present with her today Continue with supplemental oxygen with exertion  and at night, 2 L. PFTs with severe obstruction, FEV1 39%, low lung volumes, falsely elevated DLCO.  Plan: -Pulmicort nebulizers twice a day, oxygen 2 L continuously -Advised twice a day of Pulmicort is causing any adverse effects to call us back for alternative -Continue with rescue inhaler as needed.   Nocturnal hypoxemia due to obstructive chronic bronchitis Patient with known and documented nocturnal hypoxemia.  Plan: -Continue with supplemental oxygen at night -See plan for COPD.      Dyspnea Chronic dyspnea secondary to COPD, deconditioning, medical noncompliance.  Plan: -Continue with COPD optimization -2 view chest x-ray prior to follow-up visit    Updated Medication List Outpatient Encounter Prescriptions as of 03/14/2016  Medication Sig  . Acetaminophen 500 MG coapsule Take 500 mg by mouth 2 (two) times daily.  [name] amLODipine (NORVASC) 10 MG tablet Take 10 mg by mouth daily.  . budesonide (PULMICORT) 0.25 MG/2ML nebulizer solution Take 2 mLs (0.25 mg  total) by nebulization 2 (two) times daily.  . carvedilol (COREG) 3.125 MG tablet Take 1 tablet by mouth daily.  . DULoxetine (CYMBALTA) 60 MG capsule Take 60 mg by mouth daily.  [name] guaiFENesin (MUCINEX) 600 MG 12 hr tablet Take 600 mg by mouth 2 times daily at 12 noon and 4 pm.  . lidocaine (LIDODERM) 5 % Place 1 patch onto the skin every 12 (twelve) hours as needed. Reported on 09/27/2015  . LORazepam (ATIVAN) 1 MG tablet Take 1 mg by mouth every 8 (eight) hours as needed.  . montelukast (SINGULAIR) 10 MG tablet Take 10 mg by mouth daily.  [name] omeprazole (PRILOSEC) 40 MG capsule Take 40 mg by mouth daily.  [name] PROAIR HFA 108 (90 Base) MCG/ACT inhaler Inhale 2 puffs by mouth  every 4 hours as needed  . torsemide (DEMADEX) 20 MG tablet Take 20 mg by mouth daily.  . Vitamin D, Ergocalciferol, (DRISDOL) 50000 UNITS CAPS capsule Take 1 capsule by mouth once a week.  . [DISCONTINUED] Cholecalciferol (D-5000) 5000 UNITS TABS Take  5,000 mg by mouth daily.  . metoprolol succinate (TOPROL-XL) 25 MG 24 hr tablet Take 25 mg by mouth daily.   No facility-administered encounter medications on file as of 03/14/2016.     Orders for this visit: No orders of the defined types were placed in this encounter.    Thank  you for the consultation and for allowing [facility] to assist in the care of your patient. Our recommendations are noted above.  Please contact us if we can be of further service.   Stephanie Acre, MD [facility] Office Number: [phone]

## 2016-03-14 NOTE — Assessment & Plan Note (Signed)
Patient with known and documented nocturnal hypoxemia.  Plan: -Continue with supplemental oxygen at night -See plan for COPD.

## 2016-08-29 ENCOUNTER — Other Ambulatory Visit: Payer: Self-pay | Admitting: Infectious Diseases

## 2016-08-29 DIAGNOSIS — R1031 Right lower quadrant pain: Secondary | ICD-10-CM

## 2016-08-29 DIAGNOSIS — K5909 Other constipation: Secondary | ICD-10-CM | POA: Insufficient documentation

## 2016-09-09 ENCOUNTER — Ambulatory Visit
Admission: RE | Admit: 2016-09-09 | Discharge: 2016-09-09 | Disposition: A | Discharge status: Home / Self Care | Payer: Medicare Other | Source: Ambulatory Visit | Attending: Infectious Diseases | Admitting: Infectious Diseases

## 2016-09-09 DIAGNOSIS — Z9049 Acquired absence of other specified parts of digestive tract: Secondary | ICD-10-CM | POA: Insufficient documentation

## 2016-09-09 DIAGNOSIS — R1031 Right lower quadrant pain: Secondary | ICD-10-CM | POA: Diagnosis present

## 2016-09-09 DIAGNOSIS — N289 Disorder of kidney and ureter, unspecified: Secondary | ICD-10-CM | POA: Insufficient documentation

## 2016-09-09 DIAGNOSIS — K5909 Other constipation: Secondary | ICD-10-CM | POA: Diagnosis not present

## 2016-09-09 DIAGNOSIS — I700 Atherosclerosis of aorta: Secondary | ICD-10-CM | POA: Diagnosis not present

## 2016-09-09 MED ORDER — IOPAMIDOL (ISOVUE-300) INJECTION 61%
100.0000 mL | Freq: Once | INTRAVENOUS | Status: AC | PRN
  Administered 2016-09-09: 100 mL via INTRAVENOUS

## 2016-11-03 DIAGNOSIS — E538 Deficiency of other specified B group vitamins: Secondary | ICD-10-CM | POA: Insufficient documentation

## 2016-11-18 DIAGNOSIS — I495 Sick sinus syndrome: Secondary | ICD-10-CM | POA: Insufficient documentation

## 2016-12-24 ENCOUNTER — Other Ambulatory Visit: Payer: Self-pay | Admitting: *Deleted

## 2016-12-24 ENCOUNTER — Encounter: Payer: Self-pay | Admitting: *Deleted

## 2016-12-24 ENCOUNTER — Institutional Professional Consult (permissible substitution): Payer: Medicare Other | Admitting: Internal Medicine

## 2016-12-24 DIAGNOSIS — S62102A Fracture of unspecified carpal bone, left wrist, initial encounter for closed fracture: Secondary | ICD-10-CM | POA: Insufficient documentation

## 2016-12-24 DIAGNOSIS — M810 Age-related osteoporosis without current pathological fracture: Secondary | ICD-10-CM | POA: Insufficient documentation

## 2016-12-24 DIAGNOSIS — J8410 Pulmonary fibrosis, unspecified: Secondary | ICD-10-CM | POA: Insufficient documentation

## 2016-12-24 DIAGNOSIS — E785 Hyperlipidemia, unspecified: Secondary | ICD-10-CM | POA: Insufficient documentation

## 2017-01-07 ENCOUNTER — Other Ambulatory Visit: Payer: Self-pay | Admitting: Physical Medicine and Rehabilitation

## 2017-01-07 DIAGNOSIS — M5416 Radiculopathy, lumbar region: Secondary | ICD-10-CM

## 2017-01-13 ENCOUNTER — Ambulatory Visit: Payer: Medicare Other

## 2017-01-14 ENCOUNTER — Telehealth: Payer: Self-pay | Admitting: Internal Medicine

## 2017-01-14 ENCOUNTER — Ambulatory Visit (INDEPENDENT_AMBULATORY_CARE_PROVIDER_SITE_OTHER): Payer: Medicare Other | Admitting: Internal Medicine

## 2017-01-14 ENCOUNTER — Encounter: Payer: Self-pay | Admitting: Internal Medicine

## 2017-01-14 VITALS — BP 120/70 | HR 91 | Resp 16 | Ht 68.0 in | Wt 176.8 lb

## 2017-01-14 DIAGNOSIS — J9611 Chronic respiratory failure with hypoxia: Secondary | ICD-10-CM

## 2017-01-14 MED ORDER — BUDESONIDE 0.25 MG/2ML IN SUSP: 0.2500 mg | Freq: Two times a day (BID) | RESPIRATORY_TRACT | 10 refills | Status: DC

## 2017-01-14 NOTE — Progress Notes (Signed)
Date: 01/14/2017  MRN# [id number] Brittany Munoz Oct 01, 1927  Referring Physician:   ARZELLA Munoz is a 83 y.o. old female seen for COPD follow up. CC:  Chief Complaint  Patient presents with  . COPD    pt on 2L 02    Brief HPI:  83 year old female with COPD Stage II, pulmonary fibrosis, tachycardia, nocturnal hypoxemia.  Patient is currently also on 2 L of supplemental oxygen.  Events since last clinic visit: Patient presented today for follow-up of her COPD, stage II. She states that she still has intermittent wheezing and dry cough.  At her last visit she was advised to use Pulmicort twice a day, she's only been using it once a day, along with Ventolin 2-3 times per day, this has been a recurring event with her.  Today she is is accompanied by her sister-in-law. Today we went over in detail again her nebulizer regiment which is Pulmicort twice a day, and albuterol as needed every 3-4 hours. Had some wheezing two days ago, now resolved.  No signs of infection at this time No signs of congestive heart failure at this time uses oxygen at night and as needed     Allergies:  Aspirin; Cefuroxime axetil; Moxifloxacin hcl in nacl; Oxycodone; Sulfamethoxazole-trimethoprim; and Tramadol  Review of Systems: Gen:  Denies  fever, sweats, chills HEENT: Denies blurred vision, double vision, ear pain, eye pain, hearing loss, nose bleeds, sore throat Cvc:  No dizziness, chest pain or heaviness Resp:   Admits to cough, shortness of breath, Intermittent wheezing (baseline) Other:  All other systems negative  Physical Examination:   BP 120/70 (BP Location: Left Arm, Patient Position: Sitting, Cuff Size: Normal)   Pulse 91   Resp 16   Ht 5\' 8"  (1.727 m)   Wt 176 lb 12.8 oz (80.2 kg)   SpO2 90%   BMI 26.88 kg/m   General Appearance: No distress  Neuro:without focal findings, mental status, speech normal, alert and oriented, cranial nerves 2-12 intact, reflexes normal and symmetric,  sensation grossly normal  HEENT: PERRLA, EOM intact, no ptosis, no other lesions noticed; Mallampati 2 Pulmonary: no wheezing, good lung sounds and movement, fine dry basilar crackles CardiovascularNormal S1,S2.  No m/r/g.  Abdominal aorta pulsation normal.    Skin:   warm, no rashes, no ecchymosis  Extremities: normal, no cyanosis, clubbing, warm with normal capillary refill. Other findings:mild left leg swelling     ECHO 07/2014:   INTERPRETATION Closest EF: 30% (Estimated) Contraction: MOD GLOBAL DECREASE Size: MODERATELY ENLARGED Tricuspid: MILD TR Mitral: MODERATE MR  Pulmonary function testing 12/18/15 FEV1 39% FEV1/FVC 55% RV 67% TLC 60% RV/TLC 103 ERV 5 DLCO/VA 81% Interpretation-severe obstruction with no significant bronchodilator response, decreased lung volumes. DLCO uncorrected is severely elevated to 251, and believe that this is an error. Flow loops with expiratory scooping at end expiratory consistent with COPD/emphysema.   Assessment and Plan: 83 year old female past medical history of COPD moderate/severe with chronic SOB/DOE with Chronic Hypoxic resp failure  COPD, moderate/severe Advised Pulmicort nebulizers twice a day, continue with albuterol inhaler as needed for shortness of breath/wheezing/recurrent cough Continue with supplemental oxygen with exertion and at night, 2 L. PFTs with severe obstruction, FEV1 39%, low lung volumes, falsely elevated DLCO.  Plan: -Pulmicort nebulizers twice a day, oxygen 2 L continuously -Advised twice a day of Pulmicort is causing any adverse effects to call us back for alternative -Continue with rescue inhaler as needed.   Nocturnal hypoxemia due to obstructive chronic bronchitis  Patient with known and documented nocturnal hypoxemia.  Plan: -Continue with supplemental oxygen at night   Dyspnea Chronic dyspnea secondary to COPD, deconditioning, medical noncompliance.  Plan: -Continue with COPD  optimization   Patient/Family are satisfied with Plan of action and management. All questions answered  Brittany Munoz, M.D.  Brittany Munoz  Medical Director Brittany Munoz

## 2017-01-14 NOTE — Telephone Encounter (Signed)
Rx refaxed with Dx J44.1. Nothing further needed.

## 2017-01-14 NOTE — Patient Instructions (Signed)
Continue oxygen as needed pulmicort nebs as prescribed  Follow up in 6 months

## 2017-01-14 NOTE — Telephone Encounter (Signed)
[facility] is calling needing dx code for plumicort.

## 2017-01-27 ENCOUNTER — Encounter: Payer: Self-pay | Admitting: Radiology

## 2017-01-27 ENCOUNTER — Ambulatory Visit
Admission: RE | Admit: 2017-01-27 | Discharge: 2017-01-27 | Disposition: A | Discharge status: Home / Self Care | Payer: Medicare Other | Source: Ambulatory Visit | Attending: Physical Medicine and Rehabilitation | Admitting: Physical Medicine and Rehabilitation

## 2017-01-27 DIAGNOSIS — M47896 Other spondylosis, lumbar region: Secondary | ICD-10-CM | POA: Diagnosis not present

## 2017-01-27 DIAGNOSIS — M5416 Radiculopathy, lumbar region: Secondary | ICD-10-CM

## 2017-01-27 DIAGNOSIS — M545 Low back pain: Secondary | ICD-10-CM | POA: Insufficient documentation

## 2017-01-27 DIAGNOSIS — M549 Dorsalgia, unspecified: Secondary | ICD-10-CM | POA: Diagnosis present

## 2017-04-15 ENCOUNTER — Other Ambulatory Visit: Payer: Self-pay | Admitting: Internal Medicine

## 2017-04-15 MED ORDER — ALBUTEROL SULFATE HFA 108 (90 BASE) MCG/ACT IN AERS: 2.0000 | INHALATION_SPRAY | RESPIRATORY_TRACT | 3 refills | Status: DC | PRN

## 2017-09-07 ENCOUNTER — Ambulatory Visit (INDEPENDENT_AMBULATORY_CARE_PROVIDER_SITE_OTHER): Payer: Medicare Other | Admitting: Internal Medicine

## 2017-09-07 ENCOUNTER — Encounter: Payer: Self-pay | Admitting: Internal Medicine

## 2017-09-07 VITALS — BP 142/84 | HR 76 | Ht 68.0 in | Wt 174.0 lb

## 2017-09-07 DIAGNOSIS — J449 Chronic obstructive pulmonary disease, unspecified: Secondary | ICD-10-CM | POA: Diagnosis not present

## 2017-09-07 DIAGNOSIS — J9611 Chronic respiratory failure with hypoxia: Secondary | ICD-10-CM

## 2017-09-07 NOTE — Progress Notes (Signed)
Date: 09/07/2017  MRN# [id number] Brittany Munoz 18-Feb-1928  Referring Physician:   ANN-MARIE KLUGE is a 84 y.o. old female seen for COPD follow up. CC:  Follow up COPD   Brief HPI:  84 year old female with COPD Stage II, pulmonary fibrosis, tachycardia, nocturnal hypoxemia.  Patient is currently also on 2 L of supplemental oxygen.  Events since last clinic visit: Patient presented today for follow-up of her COPD, stage II. She states that she still has intermittent wheezing and dry cough.  At her last visit she was advised to use Pulmicort twice a day, she's only been using it once a day, along with Ventolin 2-3 times per day, this has been a recurring event with her.  Today she is is accompanied by her brother-in law  Today we went over in detail again her nebulizer regiment which is Pulmicort twice a day, and albuterol as needed every 3-4 hours. Had some wheezing two days ago, now resolved.  No signs of infection at this time No signs of congestive heart failure at this time uses oxygen at night and as needed     Allergies:  Aspirin; Cefuroxime axetil; Moxifloxacin hcl in nacl; Oxycodone; Sulfamethoxazole-trimethoprim; and Tramadol  Review of Systems: Gen:  Denies  fever, sweats, chills HEENT: Denies blurred vision, double vision, ear pain, eye pain, hearing loss, nose bleeds, sore throat Cvc:  No dizziness, chest pain or heaviness Resp:   Admits to cough, shortness of breath, Intermittent wheezing (baseline)  Other:  All other systems negative  Physical Examination:   BP (!) 142/84 (BP Location: Left Arm, Cuff Size: Normal)   Pulse 76   Ht 5\' 8"  (1.727 m)   Wt 174 lb (78.9 kg)   SpO2 99%   BMI 26.46 kg/m   General Appearance: No distress  Neuro:without focal findings, mental status, speech normal, alert and oriented, cranial nerves 2-12 intact, reflexes normal and symmetric, sensation grossly normal  HEENT: PERRLA, EOM intact, no ptosis, no other lesions noticed;  Mallampati 2 Pulmonary: no wheezing, good lung sounds and movement, fine dry basilar crackles CardiovascularNormal S1,S2.  No m/r/g.  Abdominal aorta pulsation normal.    Skin:   warm, no rashes, no ecchymosis  Extremities: normal, no cyanosis, clubbing, warm with normal capillary refill. Other findings:mild left leg swelling     ECHO 07/2014:   INTERPRETATION Closest EF: 30% (Estimated) Contraction: MOD GLOBAL DECREASE Size: MODERATELY ENLARGED Tricuspid: MILD TR Mitral: MODERATE MR  Pulmonary function testing 12/18/15 FEV1 39% FEV1/FVC 55% RV 67% TLC 60% RV/TLC 103 ERV 5 DLCO/VA 81% Interpretation-severe obstruction with no significant bronchodilator response, decreased lung volumes. DLCO uncorrected is severely elevated to 251, and believe that this is an error. Flow loops with expiratory scooping at end expiratory consistent with COPD/emphysema.   Assessment and Plan: 84 year old female past medical history of COPD moderate/severe with chronic SOB/DOE with Chronic Hypoxic resp failure  COPD, moderate/severe Advised Pulmicort nebulizers twice a day, continue with albuterol inhaler as needed for shortness of breath/wheezing/recurrent cough Continue with supplemental oxygen with exertion and at night, 2 L. PFTs with severe obstruction, FEV1 39%, low lung volumes, falsely elevated DLCO.  Plan: -Pulmicort nebulizers twice a day, oxygen 2 L continuously -Advised twice a day of Pulmicort is causing any adverse effects to call us back for alternative -Continue with rescue inhaler as needed.    Nocturnal hypoxemia due to obstructive chronic bronchitis Patient with known and documented nocturnal hypoxemia.  Plan: -Continue with supplemental oxygen at night Patient benefits and  needs and uses oxygen 24/7  Dyspnea Chronic dyspnea secondary to COPD, deconditioning, medical noncompliance.  Plan: -Continue with COPD optimization   Patient/Family are satisfied with Plan of  action and management. All questions answered  Lucie LeatherKurian David Gwendy Boeder, M.D.  [facility]  Medical Director [facility] Medical Director [facility] Cardio-P[facility] Department

## 2017-09-07 NOTE — Patient Instructions (Signed)
Continue Inhalers as prescribed  Continue oxygen as prescribed   

## 2017-09-22 ENCOUNTER — Other Ambulatory Visit: Payer: Self-pay | Admitting: Internal Medicine

## 2017-09-22 ENCOUNTER — Telehealth: Payer: Self-pay | Admitting: Internal Medicine

## 2017-09-22 MED ORDER — BENZONATATE 100 MG PO CAPS: 200.0000 mg | ORAL_CAPSULE | Freq: Three times a day (TID) | ORAL | 1 refills | Status: AC

## 2017-09-22 NOTE — Telephone Encounter (Signed)
Called and spoke with patient's daughter in law who also helps take care of patient. Made her [facility] and that patient does have appt that was made by PT on tomorrow. Told her that if they wish to cancel the call office back today.

## 2017-09-22 NOTE — Telephone Encounter (Signed)
Pt saw [facility] on 09/07/17. Per Physical Therapist she is coughing more and has increased fatigue. They have scheduled appt for this pm but not sure if patient is able to come in. Can something be sent to pharmacy?

## 2017-09-22 NOTE — Telephone Encounter (Signed)
Brittany Munoz with Physical Therapist stating pt is coughing a lot more than usual along with Fatigue  Pt is scheduled to come see us in the afternoon but is not sure if patient can make it  They will call back to confirm if they can  Would like some advise on this please call back

## 2017-09-22 NOTE — Progress Notes (Deleted)
Date: 09/22/2017  MRN# [id number] Brittany Munoz 26-Mar-1928    Brittany Munoz is a 84 y.o. old female seen for COPD follow up. CC:  Cough.    Brief HPI:  84 year old female with COPD Stage II, pulmonary fibrosis, tachycardia, nocturnal hypoxemia.  Patient is currently also on 2 L of supplemental oxygen.  Events since last clinic visit: Patient presented today for follow-up of her COPD, stage II. She states that she still has intermittent wheezing and dry cough.  At her last visit she was advised to use Pulmicort twice a day, she's only been using it once a day, along with Ventolin 2-3 times per day, this has been a recurring event with her.  Today she is is accompanied by her brother-in law  Today we went over in detail again her nebulizer regiment which is Pulmicort twice a day, and albuterol as needed every 3-4 hours. Had some wheezing two days ago, now resolved.  No signs of infection at this time No signs of congestive heart failure at this time uses oxygen at night and as needed  She presents today as an acute visit.   Allergies:  Aspirin; Cefuroxime axetil; Moxifloxacin hcl in nacl; Oxycodone; Sulfamethoxazole-trimethoprim; and Tramadol  Review of Systems: Gen:  Denies  fever, sweats, chills HEENT: Denies blurred vision, double vision, ear pain, eye pain, hearing loss, nose bleeds, sore throat Cvc:  No dizziness, chest pain or heaviness Resp:   Admits to cough, shortness of breath, Intermittent wheezing (baseline)  Other:  All other systems negative  Physical Examination:   There were no vitals taken for this visit.  General Appearance: No distress  Neuro:without focal findings, mental status, speech normal, alert and oriented, cranial nerves 2-12 intact, reflexes normal and symmetric, sensation grossly normal  HEENT: PERRLA, EOM intact, no ptosis, no other lesions noticed; Mallampati 2 Pulmonary: no wheezing, good lung sounds and movement, fine dry basilar  crackles CardiovascularNormal S1,S2.  No m/r/g.  Abdominal aorta pulsation normal.    Skin:   warm, no rashes, no ecchymosis  Extremities: normal, no cyanosis, clubbing, warm with normal capillary refill. Other findings:mild left leg swelling     ECHO 07/2014:   INTERPRETATION Closest EF: 30% (Estimated) Contraction: MOD GLOBAL DECREASE Size: MODERATELY ENLARGED Tricuspid: MILD TR Mitral: MODERATE MR  Pulmonary function testing 12/18/15 FEV1 39% FEV1/FVC 55% RV 67% TLC 60% RV/TLC 103 ERV 5 DLCO/VA 81% Interpretation-severe obstruction with no significant bronchodilator response, decreased lung volumes. DLCO uncorrected is severely elevated to 251, and believe that this is an error. Flow loops with expiratory scooping at end expiratory consistent with COPD/emphysema.   Assessment and Plan: 84 year old female past medical history of COPD moderate/severe with chronic SOB/DOE with Chronic Hypoxic resp failure  COPD, moderate/severe Advised Pulmicort nebulizers twice a day, continue with albuterol inhaler as needed for shortness of breath/wheezing/recurrent cough Continue with supplemental oxygen with exertion and at night, 2 L. PFTs with severe obstruction, FEV1 39%, low lung volumes, falsely elevated DLCO.  Plan: -Pulmicort nebulizers twice a day, oxygen 2 L continuously -Advised twice a day of Pulmicort is causing any adverse effects to call us back for alternative -Continue with rescue inhaler as needed.    Nocturnal hypoxemia due to obstructive chronic bronchitis Patient with known and documented nocturnal hypoxemia.  Plan: -Continue with supplemental oxygen at night Patient benefits and needs and uses oxygen 24/7  Dyspnea Chronic dyspnea secondary to COPD, deconditioning, medical noncompliance.  Plan: -Continue with COPD optimization  Deep [name], MD.  Board Certified in Internal Medicine, Pulmonary Medicine, Critical Care Medicine, and Sleep Medicine.   [facility] Office Number: [phone] Pager: [phone]  Santiago Glad, M.D.  Billy Fischer, M.D

## 2017-09-22 NOTE — Telephone Encounter (Signed)
Sent in script for tessalon three times daily.

## 2017-09-23 ENCOUNTER — Ambulatory Visit: Payer: Medicare Other | Admitting: Internal Medicine

## 2017-10-13 ENCOUNTER — Other Ambulatory Visit: Payer: Self-pay | Admitting: Physical Medicine and Rehabilitation

## 2017-10-13 DIAGNOSIS — M5412 Radiculopathy, cervical region: Secondary | ICD-10-CM

## 2017-10-23 ENCOUNTER — Ambulatory Visit
Admission: RE | Admit: 2017-10-23 | Discharge: 2017-10-23 | Disposition: A | Discharge status: Home / Self Care | Payer: Medicare Other | Source: Ambulatory Visit | Attending: Physical Medicine and Rehabilitation | Admitting: Physical Medicine and Rehabilitation

## 2017-10-23 DIAGNOSIS — M4802 Spinal stenosis, cervical region: Secondary | ICD-10-CM | POA: Insufficient documentation

## 2017-10-23 DIAGNOSIS — M4602 Spinal enthesopathy, cervical region: Secondary | ICD-10-CM | POA: Insufficient documentation

## 2017-10-23 DIAGNOSIS — M5412 Radiculopathy, cervical region: Secondary | ICD-10-CM | POA: Diagnosis not present

## 2017-10-23 DIAGNOSIS — M2578 Osteophyte, vertebrae: Secondary | ICD-10-CM | POA: Diagnosis not present

## 2018-01-19 ENCOUNTER — Ambulatory Visit (INDEPENDENT_AMBULATORY_CARE_PROVIDER_SITE_OTHER): Payer: Medicare Other | Admitting: Internal Medicine

## 2018-01-19 ENCOUNTER — Encounter: Payer: Self-pay | Admitting: Internal Medicine

## 2018-01-19 VITALS — BP 126/76 | HR 92 | Ht 68.0 in | Wt 174.0 lb

## 2018-01-19 DIAGNOSIS — J441 Chronic obstructive pulmonary disease with (acute) exacerbation: Secondary | ICD-10-CM

## 2018-01-19 MED ORDER — AZITHROMYCIN 250 MG PO TABS: ORAL_TABLET | ORAL | 0 refills | Status: DC

## 2018-01-19 MED ORDER — PREDNISONE 10 MG PO TABS: 10.0000 mg | ORAL_TABLET | Freq: Every day | ORAL | 1 refills | Status: DC

## 2018-01-19 MED ORDER — HYDROCOD POLST-CPM POLST ER 10-8 MG/5ML PO SUER: 5.0000 mL | Freq: Two times a day (BID) | ORAL | 0 refills | Status: DC | PRN

## 2018-01-19 MED ORDER — BUDESONIDE 0.25 MG/2ML IN SUSP: 0.2500 mg | Freq: Two times a day (BID) | RESPIRATORY_TRACT | 10 refills | Status: DC

## 2018-01-19 NOTE — Progress Notes (Signed)
Date: 01/19/2018  MRN# [id number] Brittany Munoz 04-16-1928  Referring Physician:   Berton BonBetty L Cushman is a 84 y.o. old female seen for COPD follow up. CC:  Follow up COPD   Brief HPI:  84 year old female with COPD Stage II, pulmonary fibrosis, tachycardia, nocturnal hypoxemia.  Patient is currently also on 2 L of supplemental oxygen.  Events since last clinic visit: Patient presented today for follow-up of her COPD, stage II. At her last visit she was advised to use Pulmicort twice a day, she's only been using it once a day, along with Ventolin 2-3 times per day, this has been a recurring event with her.  Today she is is accompanied by her brother-in law  Today we went over in detail again her nebulizer regiment which is Pulmicort twice a day, and albuterol as needed every 3-4 hours.  Has some wheezing + signs of infection at this time-productive cough and low grade fevers No signs of congestive heart failure at this time uses oxygen at night and as needed     Allergies:  Aspirin; Cefuroxime axetil; Moxifloxacin hcl in nacl; Oxycodone; Sulfamethoxazole-trimethoprim; and Tramadol  Review of Systems: Gen: +fever, sweats, chills HEENT: Denies blurred vision, double vision, ear pain, eye pain, hearing loss, nose bleeds, sore throat Cvc:  No dizziness, chest pain or heaviness Resp:   +Productive cough, shortness of breath, Intermittent wheezing Other:  All other systems negative  Physical Examination:   BP 126/76 (BP Location: Left Arm, Cuff Size: Normal)   Pulse 92   Ht 5\' 8"  (1.727 m)   Wt 174 lb (78.9 kg)   SpO2 96%   BMI 26.46 kg/m   General Appearance: No distress  Neuro:without focal findings, mental status, speech normal, alert and oriented, cranial nerves 2-12 intact, reflexes normal and symmetric, sensation grossly normal  HEENT: PERRLA, EOM intact, no ptosis, no other lesions noticed; Mallampati 2 Pulmonary:+ wheezing, good lung sounds and movement, fine dry  basilar crackles CardiovascularNormal S1,S2.  No m/r/g.  Abdominal aorta pulsation normal.    Skin:   warm, no rashes, no ecchymosis  Extremities: normal, no cyanosis, clubbing, warm with normal capillary refill. Other findings:mild left leg swelling     ECHO 07/2014:   INTERPRETATION Closest EF: 30% (Estimated) Contraction: MOD GLOBAL DECREASE Size: MODERATELY ENLARGED Tricuspid: MILD TR Mitral: MODERATE MR  Pulmonary function testing 12/18/15 FEV1 39% FEV1/FVC 55% RV 67% TLC 60% RV/TLC 103 ERV 5 DLCO/VA 81% Interpretation-severe obstruction with no significant bronchodilator response, decreased lung volumes. DLCO uncorrected is severely elevated to 251, and believe that this is an error. Flow loops with expiratory scooping at end expiratory consistent with COPD/emphysema.   Assessment and Plan: 84 year old female past medical history of COPD moderate/severe with chronic SOB/DOE with Chronic Hypoxic resp failure with COPD exacerbation  COPD, moderate/severe Advised Pulmicort nebulizers twice a day, continue with albuterol inhaler as needed for shortness of breath/wheezing/recurrent cough Continue with supplemental oxygen with exertion and at night, 2 L. PFTs with severe obstruction, FEV1 39%, low lung volumes, falsely elevated DLCO.  Plan: -Pulmicort nebulizers twice a day, oxygen 2 L continuously -Advised twice a day of Pulmicort is causing any adverse effects to call us back for alternative -Continue with rescue inhaler as needed.  COPD exacerbation Start prednisone 10 mg daily for 10 days z pak   Nocturnal hypoxemia due to obstructive chronic bronchitis Patient with known and documented nocturnal hypoxemia.  Plan: -Continue with supplemental oxygen at night Patient benefits and needs and uses oxygen 24/7  Dyspnea Chronic dyspnea secondary to COPD, deconditioning, medical noncompliance.  Plan: -Continue with COPD optimization   Patient/Family are satisfied  with Plan of action and management. All questions answered Follow up in 6 months  Karri Kallenbach Santiago Glad, M.D.  [facility]  Medical Director [facility] Medical Director [facility] Cardio-Pulmonary Department

## 2018-01-19 NOTE — Patient Instructions (Signed)
Prednisone 10 mg daily for 10 day Z pak for 6 days Tussionex for cough

## 2018-01-25 ENCOUNTER — Telehealth: Payer: Self-pay | Admitting: Internal Medicine

## 2018-01-25 NOTE — Telephone Encounter (Signed)
Patient had episode this weekend after starting prednisone  .  Patient c/o Headache .  Staff at [facility] unable to obtain accurate bp called ems they measured:  BP: 165/135  HR 104  Please call patient and or Su [facility]ckinnley at [facility] [phone]

## 2018-01-25 NOTE — Telephone Encounter (Signed)
Returned call to Burna MortimerWanda: She reports patient is not taking bp medication and 02 as directed. Beverlee NimsMckinzzie is physical therapist who is taking it upon herself to call providers with information about patient. Informed cardiologist needs to be informed of increasing blood pressures. EMS states patient is ok and did not need to be transported to hospital.

## 2018-01-25 NOTE — Telephone Encounter (Signed)
Pt medical POA would like for someone to call and discuss pt BP .

## 2018-01-25 NOTE — Telephone Encounter (Signed)
Pt POA states that pt is not taking her BP medication

## 2018-01-29 ENCOUNTER — Telehealth: Payer: Self-pay | Admitting: Internal Medicine

## 2018-01-29 MED ORDER — BUDESONIDE 0.25 MG/2ML IN SUSP: 0.2500 mg | Freq: Two times a day (BID) | RESPIRATORY_TRACT | 10 refills | Status: DC

## 2018-01-29 NOTE — Telephone Encounter (Signed)
Patient daughter in law Brittany Munoz calling States that she has a questions in regards to the inhaler and nebulizer Please call to discuss

## 2018-01-29 NOTE — Telephone Encounter (Signed)
[facility] uses [facility] for [facility]bs.

## 2018-02-05 NOTE — Telephone Encounter (Signed)
Explained that Dr. Belia HemanKasa has not been in clinic. Rx needs a signature Once signed we will have [facility].

## 2018-02-05 NOTE — Telephone Encounter (Signed)
Pt daughter in law calling stating the orders we sent in, has not gone in or something is not working. For they have not received any notice from pharmacy   Please call back

## 2018-02-05 NOTE — Telephone Encounter (Signed)
Explained that rx

## 2018-11-03 ENCOUNTER — Other Ambulatory Visit: Payer: Self-pay | Admitting: Internal Medicine

## 2018-11-03 NOTE — Telephone Encounter (Signed)
PT NEEDS APT FOR FURTHER REFILLS.

## 2018-11-17 ENCOUNTER — Other Ambulatory Visit: Payer: Self-pay | Admitting: Internal Medicine

## 2019-03-09 ENCOUNTER — Other Ambulatory Visit: Payer: Self-pay | Admitting: Internal Medicine

## 2019-03-21 ENCOUNTER — Other Ambulatory Visit: Payer: Self-pay | Admitting: Internal Medicine

## 2019-10-08 ENCOUNTER — Other Ambulatory Visit: Payer: Self-pay | Admitting: Internal Medicine

## 2020-02-15 ENCOUNTER — Ambulatory Visit: Payer: Self-pay | Admitting: Urology

## 2020-02-29 ENCOUNTER — Other Ambulatory Visit: Payer: Self-pay

## 2020-02-29 ENCOUNTER — Ambulatory Visit (INDEPENDENT_AMBULATORY_CARE_PROVIDER_SITE_OTHER): Payer: Medicare Other | Admitting: Urology

## 2020-02-29 ENCOUNTER — Encounter: Payer: Self-pay | Admitting: Urology

## 2020-02-29 VITALS — BP 132/80 | HR 91 | Ht 66.0 in | Wt 165.0 lb

## 2020-02-29 DIAGNOSIS — R32 Unspecified urinary incontinence: Secondary | ICD-10-CM

## 2020-02-29 DIAGNOSIS — N281 Cyst of kidney, acquired: Secondary | ICD-10-CM

## 2020-02-29 NOTE — Progress Notes (Signed)
02/29/20 12:46 PM   Brittany Munoz Jul 06, 1928 [id number]  CC: right renal cyst, incontinence  HPI: I saw Brittany Munoz in urology clinic for evaluation of a right renal cyst and incontinence.  She is a very comorbid and frail-appearing 86 year old female with history of cardiomyopathy, COPD and oxygen, diabetes, and pulmonary fibrosis who was found to have a 1.5 cm mid pole right renal lesion all the way back in February 2018.  Radiology suggested this may represent a hyperdense or hemorrhagic cyst, or even a slow-growing papillary RCC.  It does not appear that she is ever been evaluated by urology previously.  The cyst has been present since at least 2014, and measured 1 cm on that CT.  She denies any flank pain, weight loss, or gross hematuria.  She has a distant smoking history but quit 50 years ago.  She also has a reported history of a bladder repair over 5 years ago, but she is unable to elaborate on any details of this, and there are no prior records.  She has chronic incontinence both urge and stress leakage, and wears a depends.  She only urinates 1-2 times per day.  She spends most of the day sitting.  She drinks primarily tea during the day.  On review of prior urine samples there is no microscopic hematuria. PMH: Past Medical History:  Diagnosis Date  . Anxiety   . Asthma   . Cardiomyopathy (HCC)   . Cataract   . COPD (chronic obstructive pulmonary disease) (HCC)   . Degenerative arthritis   . Depression   . Diabetes (HCC)   . Eczema   . Gout   . Hemorrhoids   . Hyperlipidemia   . Hypertension   . Nocturnal hypoxia   . Osteoporosis   . Pulmonary fibrosis (HCC)   . Seasonal allergies   . Shingles     Surgical History: Past Surgical History:  Procedure Laterality Date  . BLADDER REPAIR    . BREAST CYST REMOVAL    . BROKEN ANKLE    . CARDIAC CATHETERIZATION    . CHOLECYSTECTOMY    . JOINT REPLACEMENT    . KNEE ARTHROSCOPY    . MOUTH SURGERY    . OPEN  REDUCTION INTERNAL FIXATION (ORIF) DISTAL RADIAL FRACTURE    . REPAIR ROTATOR CUFF TEAR    . SKIN CANCER REMOVED     RT KNEE AND FOREHEAD  . TONSILLECTOMY AND ADENOIDECTOMY    . WISDOM TOOTH REMOVAL      Family History: Family History  Problem Relation Age of Onset  . Hypertension Mother   . Asthma Mother   . COPD Mother   . Arthritis Mother   . Colon cancer Father   . Arthritis Sister     Social History:  reports that she quit smoking about 56 years ago. Her smoking use included cigarettes. She has a 50.00 pack-year smoking history. She has never used smokeless tobacco. She reports that she does not drink alcohol and does not use drugs.  Physical Exam: BP (!) 132/80   Pulse 91   Ht 5\' 6"  (1.676 m)   Wt 165 lb (74.8 kg)   BMI 26.63 kg/m    Constitutional:  Alert and oriented, frail-appearing, on oxygen Cardiovascular: No clubbing, cyanosis, or edema. Respiratory: Normal respiratory effort, no increased work of breathing. GI: Abdomen is soft, nontender, nondistended, no abdominal masses  Laboratory Data: Reviewed, see HPI  Pertinent Imaging: I have personally reviewed the CT from 2018 in  2014, see HPI for details, imaging appears most consistent with a hyperdense/hemorrhagic cyst  Assessment & Plan:   In summary she is a comorbid and frail-appearing 86 year old female who was referred for a 1.5 cm cyst last seen on CT abdomen pelvis with contrast in February 2018, which had enlarged only slightly from a little over 1 cm in 2014.  We discussed possibilities including hemorrhagic first hyperdense cyst, or even a slow-growing renal neoplasm.  We discussed options at length including CT, MRI, or repeat ultrasound, or no intervention.  I think with her numerous comorbidities and the stability of the lesion over 4 years previously, I have a very low suspicion for a clinically significant lesion or malignancy.  Regardless, even if the lesion had grown on imaging, we would likely  continue to recommend surveillance as opposed to any intervention with her comorbidities and lack of symptoms.  She agrees with this plan of action.  We discussed the very low, but not 0, risk of developing a clinically significant renal malignancy and gross hematuria or metastatic disease.  Regarding her incontinence, I recommended a conservative approach with timed voiding, minimizing bladder irritants like tea in the diet, and Kegel exercises.  Follow-up as needed  I spent 45 total minutes on the day of the encounter including pre-visit review of the medical record, face-to-face time with the patient, and post visit ordering of labs/imaging/tests.  Legrand Rams, MD 02/29/2020  [facility] [address] [place], [place] [address] [phone]

## 2020-02-29 NOTE — Patient Instructions (Addendum)
If things get worse with urination, please call our office for PA visit.

## 2020-06-11 ENCOUNTER — Other Ambulatory Visit: Payer: Self-pay | Admitting: Internal Medicine

## 2021-05-14 ENCOUNTER — Other Ambulatory Visit: Payer: Self-pay

## 2021-05-14 ENCOUNTER — Emergency Department: Payer: Medicare Other

## 2021-05-14 ENCOUNTER — Inpatient Hospital Stay
Admission: EM | Admit: 2021-05-14 | Discharge: 2021-05-16 | DRG: 092 | Disposition: A | Discharge status: Home Health | Payer: Medicare Other | Attending: Internal Medicine | Admitting: Internal Medicine

## 2021-05-14 DIAGNOSIS — Z79899 Other long term (current) drug therapy: Secondary | ICD-10-CM

## 2021-05-14 DIAGNOSIS — I110 Hypertensive heart disease with heart failure: Secondary | ICD-10-CM | POA: Diagnosis present

## 2021-05-14 DIAGNOSIS — I5042 Chronic combined systolic (congestive) and diastolic (congestive) heart failure: Secondary | ICD-10-CM | POA: Diagnosis present

## 2021-05-14 DIAGNOSIS — F32A Depression, unspecified: Secondary | ICD-10-CM | POA: Diagnosis present

## 2021-05-14 DIAGNOSIS — Z9981 Dependence on supplemental oxygen: Secondary | ICD-10-CM

## 2021-05-14 DIAGNOSIS — M25562 Pain in left knee: Secondary | ICD-10-CM

## 2021-05-14 DIAGNOSIS — Z8249 Family history of ischemic heart disease and other diseases of the circulatory system: Secondary | ICD-10-CM

## 2021-05-14 DIAGNOSIS — I10 Essential (primary) hypertension: Secondary | ICD-10-CM | POA: Diagnosis not present

## 2021-05-14 DIAGNOSIS — W19XXXA Unspecified fall, initial encounter: Secondary | ICD-10-CM | POA: Diagnosis not present

## 2021-05-14 DIAGNOSIS — E785 Hyperlipidemia, unspecified: Secondary | ICD-10-CM | POA: Diagnosis present

## 2021-05-14 DIAGNOSIS — G2581 Restless legs syndrome: Secondary | ICD-10-CM | POA: Diagnosis present

## 2021-05-14 DIAGNOSIS — R2689 Other abnormalities of gait and mobility: Principal | ICD-10-CM | POA: Diagnosis present

## 2021-05-14 DIAGNOSIS — Z20822 Contact with and (suspected) exposure to covid-19: Secondary | ICD-10-CM | POA: Diagnosis present

## 2021-05-14 DIAGNOSIS — J8410 Pulmonary fibrosis, unspecified: Secondary | ICD-10-CM | POA: Diagnosis present

## 2021-05-14 DIAGNOSIS — M160 Bilateral primary osteoarthritis of hip: Secondary | ICD-10-CM | POA: Diagnosis present

## 2021-05-14 DIAGNOSIS — Z800 Family history of malignant neoplasm of digestive organs: Secondary | ICD-10-CM

## 2021-05-14 DIAGNOSIS — M1712 Unilateral primary osteoarthritis, left knee: Secondary | ICD-10-CM | POA: Diagnosis present

## 2021-05-14 DIAGNOSIS — E538 Deficiency of other specified B group vitamins: Secondary | ICD-10-CM | POA: Diagnosis present

## 2021-05-14 DIAGNOSIS — M25551 Pain in right hip: Secondary | ICD-10-CM | POA: Diagnosis not present

## 2021-05-14 DIAGNOSIS — J449 Chronic obstructive pulmonary disease, unspecified: Secondary | ICD-10-CM | POA: Diagnosis present

## 2021-05-14 DIAGNOSIS — Z85828 Personal history of other malignant neoplasm of skin: Secondary | ICD-10-CM

## 2021-05-14 DIAGNOSIS — R269 Unspecified abnormalities of gait and mobility: Secondary | ICD-10-CM | POA: Diagnosis present

## 2021-05-14 DIAGNOSIS — M810 Age-related osteoporosis without current pathological fracture: Secondary | ICD-10-CM | POA: Diagnosis present

## 2021-05-14 DIAGNOSIS — F419 Anxiety disorder, unspecified: Secondary | ICD-10-CM | POA: Diagnosis present

## 2021-05-14 DIAGNOSIS — Z7951 Long term (current) use of inhaled steroids: Secondary | ICD-10-CM

## 2021-05-14 DIAGNOSIS — Z825 Family history of asthma and other chronic lower respiratory diseases: Secondary | ICD-10-CM

## 2021-05-14 DIAGNOSIS — Z8261 Family history of arthritis: Secondary | ICD-10-CM

## 2021-05-14 DIAGNOSIS — E559 Vitamin D deficiency, unspecified: Secondary | ICD-10-CM | POA: Diagnosis present

## 2021-05-14 DIAGNOSIS — E119 Type 2 diabetes mellitus without complications: Secondary | ICD-10-CM | POA: Diagnosis present

## 2021-05-14 DIAGNOSIS — Z9181 History of falling: Secondary | ICD-10-CM

## 2021-05-14 DIAGNOSIS — Z87891 Personal history of nicotine dependence: Secondary | ICD-10-CM | POA: Diagnosis not present

## 2021-05-14 DIAGNOSIS — M25552 Pain in left hip: Secondary | ICD-10-CM | POA: Diagnosis not present

## 2021-05-14 LAB — BASIC METABOLIC PANEL
Anion gap: 10 (ref 5–15)
BUN: 20 mg/dL (ref 8–23)
CO2: 29 mmol/L (ref 22–32)
Calcium: 8.8 mg/dL — ABNORMAL LOW (ref 8.9–10.3)
Chloride: 100 mmol/L (ref 98–111)
Creatinine, Ser: 1.02 mg/dL — ABNORMAL HIGH (ref 0.44–1.00)
GFR, Estimated: 52 mL/min — ABNORMAL LOW (ref >60)
Glucose, Bld: 120 mg/dL — ABNORMAL HIGH (ref 70–99)
Potassium: 3.7 mmol/L (ref 3.5–5.1)
Sodium: 139 mmol/L (ref 135–145)

## 2021-05-14 LAB — CBC
HCT: 39.3 % (ref 36.0–46.0)
Hemoglobin: 12.3 g/dL (ref 12.0–15.0)
MCH: 24.5 pg — ABNORMAL LOW (ref 26.0–34.0)
MCHC: 31.3 g/dL (ref 30.0–36.0)
MCV: 78.3 fL — ABNORMAL LOW (ref 80.0–100.0)
Platelets: 309 10*3/uL (ref 150–400)
RBC: 5.02 MIL/uL (ref 3.87–5.11)
RDW: 15.2 % (ref 11.5–15.5)
WBC: 7 10*3/uL (ref 4.0–10.5)
nRBC: 0 % (ref 0.0–0.2)

## 2021-05-14 LAB — URINALYSIS, COMPLETE (UACMP) WITH MICROSCOPIC
Bacteria, UA: NONE SEEN
Bilirubin Urine: NEGATIVE
Glucose, UA: NEGATIVE mg/dL
Ketones, ur: 5 mg/dL — AB
Leukocytes,Ua: NEGATIVE
Nitrite: NEGATIVE
Protein, ur: NEGATIVE mg/dL
Specific Gravity, Urine: 1.016 (ref 1.005–1.030)
Squamous Epithelial / HPF: NONE SEEN (ref 0–5)
pH: 6 (ref 5.0–8.0)

## 2021-05-14 LAB — RESP PANEL BY RT-PCR (FLU A&B, COVID) ARPGX2
Influenza A by PCR: NEGATIVE
Influenza B by PCR: NEGATIVE
SARS Coronavirus 2 by RT PCR: NEGATIVE

## 2021-05-14 LAB — TROPONIN I (HIGH SENSITIVITY): Troponin I (High Sensitivity): 7 ng/L (ref <18)

## 2021-05-14 MED ORDER — ACETAMINOPHEN 325 MG PO TABS
650.0000 mg | ORAL_TABLET | Freq: Four times a day (QID) | ORAL | Status: DC | PRN
  Administered 2021-05-15 – 2021-05-16 (×2): 650 mg via ORAL
  Filled 2021-05-14 (×2): qty 2

## 2021-05-14 MED ORDER — TORSEMIDE 20 MG PO TABS
20.0000 mg | ORAL_TABLET | Freq: Every day | ORAL | Status: DC
  Administered 2021-05-14 – 2021-05-16 (×3): 20 mg via ORAL
  Filled 2021-05-14 (×3): qty 1

## 2021-05-14 MED ORDER — FENTANYL CITRATE PF 50 MCG/ML IJ SOSY
12.5000 ug | PREFILLED_SYRINGE | INTRAMUSCULAR | Status: DC | PRN
  Administered 2021-05-15: 12.5 ug via INTRAVENOUS
  Filled 2021-05-14: qty 1

## 2021-05-14 MED ORDER — ALBUTEROL SULFATE (2.5 MG/3ML) 0.083% IN NEBU: 3.0000 mL | INHALATION_SOLUTION | RESPIRATORY_TRACT | Status: DC | PRN

## 2021-05-14 MED ORDER — FENTANYL CITRATE PF 50 MCG/ML IJ SOSY
25.0000 ug | PREFILLED_SYRINGE | INTRAMUSCULAR | Status: DC | PRN
Start: 2021-05-14 — End: 2021-05-14
  Administered 2021-05-14: 25 ug via INTRAVENOUS
  Filled 2021-05-14: qty 1

## 2021-05-14 MED ORDER — CYANOCOBALAMIN 1000 MCG/ML IJ SOLN: 1000.0000 ug | INTRAMUSCULAR | Status: DC

## 2021-05-14 MED ORDER — VITAMIN D (ERGOCALCIFEROL) 1.25 MG (50000 UNIT) PO CAPS: 50000.0000 [IU] | ORAL_CAPSULE | ORAL | Status: DC

## 2021-05-14 MED ORDER — ENOXAPARIN SODIUM 40 MG/0.4ML IJ SOSY
40.0000 mg | PREFILLED_SYRINGE | Freq: Every day | INTRAMUSCULAR | Status: DC
  Administered 2021-05-14 – 2021-05-15 (×2): 40 mg via SUBCUTANEOUS
  Filled 2021-05-14 (×2): qty 0.4

## 2021-05-14 MED ORDER — SODIUM CHLORIDE 0.9% FLUSH
3.0000 mL | Freq: Two times a day (BID) | INTRAVENOUS | Status: DC
  Administered 2021-05-14 – 2021-05-16 (×4): 3 mL via INTRAVENOUS

## 2021-05-14 MED ORDER — DULOXETINE HCL 60 MG PO CPEP
60.0000 mg | ORAL_CAPSULE | Freq: Every day | ORAL | Status: DC
  Administered 2021-05-15 – 2021-05-16 (×2): 60 mg via ORAL
  Filled 2021-05-14 (×2): qty 1

## 2021-05-14 MED ORDER — BUDESONIDE 0.25 MG/2ML IN SUSP: 2.0000 mL | Freq: Two times a day (BID) | RESPIRATORY_TRACT | Status: DC | PRN

## 2021-05-14 MED ORDER — SODIUM CHLORIDE 0.9 % IV SOLN: INTRAVENOUS | Status: DC

## 2021-05-14 MED ORDER — ACETAMINOPHEN 650 MG RE SUPP: 650.0000 mg | Freq: Four times a day (QID) | RECTAL | Status: DC | PRN

## 2021-05-14 MED ORDER — ACETAMINOPHEN 325 MG PO TABS
650.0000 mg | ORAL_TABLET | Freq: Once | ORAL | Status: AC
  Administered 2021-05-14: 650 mg via ORAL
  Filled 2021-05-14: qty 2

## 2021-05-14 MED ORDER — MAGNESIUM HYDROXIDE 400 MG/5ML PO SUSP: 30.0000 mL | Freq: Every day | ORAL | Status: DC | PRN

## 2021-05-14 MED ORDER — ONDANSETRON HCL 4 MG/2ML IJ SOLN: 4.0000 mg | Freq: Four times a day (QID) | INTRAMUSCULAR | Status: DC | PRN

## 2021-05-14 MED ORDER — GUAIFENESIN ER 600 MG PO TB12: 600.0000 mg | ORAL_TABLET | Freq: Two times a day (BID) | ORAL | Status: DC | PRN

## 2021-05-14 MED ORDER — SODIUM CHLORIDE 0.9 % IV SOLN
250.0000 mL | INTRAVENOUS | Status: DC | PRN
Start: 2021-05-14 — End: 2021-05-16

## 2021-05-14 MED ORDER — ONDANSETRON HCL 4 MG PO TABS: 4.0000 mg | ORAL_TABLET | Freq: Four times a day (QID) | ORAL | Status: DC | PRN

## 2021-05-14 MED ORDER — TRAZODONE HCL 50 MG PO TABS
25.0000 mg | ORAL_TABLET | Freq: Every evening | ORAL | Status: DC | PRN
  Administered 2021-05-15: 25 mg via ORAL
  Filled 2021-05-14 (×2): qty 1

## 2021-05-14 MED ORDER — PANTOPRAZOLE SODIUM 40 MG PO TBEC
40.0000 mg | DELAYED_RELEASE_TABLET | Freq: Every day | ORAL | Status: DC
  Administered 2021-05-15 – 2021-05-16 (×2): 40 mg via ORAL
  Filled 2021-05-14 (×2): qty 1

## 2021-05-14 MED ORDER — AMLODIPINE BESYLATE 5 MG PO TABS
5.0000 mg | ORAL_TABLET | Freq: Every day | ORAL | Status: DC
  Administered 2021-05-14 – 2021-05-15 (×2): 5 mg via ORAL
  Filled 2021-05-14 (×2): qty 1

## 2021-05-14 MED ORDER — METOPROLOL SUCCINATE ER 25 MG PO TB24
25.0000 mg | ORAL_TABLET | Freq: Every day | ORAL | Status: DC
  Administered 2021-05-14 – 2021-05-16 (×3): 25 mg via ORAL
  Filled 2021-05-14 (×3): qty 1

## 2021-05-14 MED ORDER — MONTELUKAST SODIUM 10 MG PO TABS
10.0000 mg | ORAL_TABLET | Freq: Every day | ORAL | Status: DC
  Administered 2021-05-14 – 2021-05-15 (×2): 10 mg via ORAL
  Filled 2021-05-14 (×2): qty 1

## 2021-05-14 MED ORDER — DICYCLOMINE HCL 10 MG PO CAPS
10.0000 mg | ORAL_CAPSULE | Freq: Two times a day (BID) | ORAL | Status: DC | PRN
  Filled 2021-05-14: qty 1

## 2021-05-14 MED ORDER — ROPINIROLE HCL 1 MG PO TABS
0.5000 mg | ORAL_TABLET | Freq: Three times a day (TID) | ORAL | Status: DC
  Administered 2021-05-15 – 2021-05-16 (×4): 0.5 mg via ORAL
  Filled 2021-05-14 (×3): qty 1
  Filled 2021-05-14: qty 2
  Filled 2021-05-14: qty 1

## 2021-05-14 MED ORDER — SODIUM CHLORIDE 0.9% FLUSH: 3.0000 mL | INTRAVENOUS | Status: DC | PRN

## 2021-05-14 NOTE — ED Provider Notes (Signed)
[facility] Emergency Department Provider Note    Event Date/Time   First MD Initiated Contact with Patient 05/14/21 1458     (approximate)  I have reviewed the triage vital signs and the nursing notes.   HISTORY  Chief Complaint Fall    HPI Brittany Munoz is a 87 y.o. female below listed past medical history presents to the ER for evaluation of fall last night will get in bathroom.  Was feeling some generalized weakness but primarily complaining of bilateral hip pain as well as left knee pain.  Has a history of severe osteoarthritis.  Did not hit her head.  Denies any numbness or tingling.  Denies any abdominal pain.  Past Medical History:  Diagnosis Date   Anxiety    Asthma    Cardiomyopathy (HCC)    Cataract    COPD (chronic obstructive pulmonary disease) (HCC)    Degenerative arthritis    Depression    Diabetes (HCC)    Eczema    Gout    Hemorrhoids    Hyperlipidemia    Hypertension    Nocturnal hypoxia    Osteoporosis    Pulmonary fibrosis (HCC)    Seasonal allergies    Shingles    Family History  Problem Relation Age of Onset   Hypertension Mother    Asthma Mother    COPD Mother    Arthritis Mother    Colon cancer Father    Arthritis Sister    Prostate cancer Neg Hx    Bladder Cancer Neg Hx    Kidney cancer Neg Hx    Past Surgical History:  Procedure Laterality Date   BLADDER REPAIR     BREAST CYST REMOVAL     BROKEN ANKLE     CARDIAC CATHETERIZATION     CHOLECYSTECTOMY     JOINT REPLACEMENT     KNEE ARTHROSCOPY     MOUTH SURGERY     OPEN REDUCTION INTERNAL FIXATION (ORIF) DISTAL RADIAL FRACTURE     REPAIR ROTATOR CUFF TEAR     SKIN CANCER REMOVED     RT KNEE AND FOREHEAD   TONSILLECTOMY AND ADENOIDECTOMY     WISDOM TOOTH REMOVAL     Patient Active Problem List   Diagnosis Date Noted   Hyperlipidemia, unspecified 12/24/2016   Osteoporosis 12/24/2016   Pulmonary fibrosis (HCC) 12/24/2016   Wrist fracture, left  12/24/2016   Tachy-brady syndrome (HCC) 11/18/2016   B12 deficiency 11/03/2016   Chronic constipation 08/29/2016   Dyspnea 03/14/2016   Abdominal pain, RLQ (right lower quadrant) 10/08/2015   Chronic hyperglycemia 10/08/2015   COPD, moderate (HCC) 01/10/2015   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) 01/10/2015   Anemia, unspecified 10/18/2013   Chronic obstructive airway disease with asthma (HCC) 10/18/2013   Benign essential hypertension 10/18/2013   Osteoarthrosis involving lower leg 10/18/2013   Vitamin D deficiency, unspecified 10/18/2013      Prior to Admission medications   Medication Sig Start Date End Date Taking? Authorizing Provider  acetaminophen (TYLENOL) 650 MG CR tablet Take 1,300 mg by mouth 2 (two) times daily.   Yes [provider]  amLODipine (NORVASC) 5 MG tablet Take 5 mg by mouth daily. 05/01/21  Yes [provider]  budesonide (PULMICORT) 0.25 MG/2ML nebulizer solution Inhale 2 mLs into the lungs 2 (two) times daily as needed for wheezing or shortness of breath. 09/17/20  Yes [provider]  DULoxetine (CYMBALTA) 60 MG capsule Take 60 mg by mouth  daily. 10/18/14  Yes [provider]  metoprolol succinate (TOPROL-XL) 25 MG 24 hr tablet Take 25 mg by mouth daily. 11/02/14 05/14/21 Yes [provider]  montelukast (SINGULAIR) 10 MG tablet Take 10 mg by mouth daily. 10/18/14  Yes [provider]  omeprazole (PRILOSEC) 40 MG capsule Take 40 mg by mouth daily. 10/18/14  Yes [provider]  torsemide (DEMADEX) 20 MG tablet Take 20 mg by mouth daily. 07/06/14  Yes [provider]  Acetaminophen 500 MG coapsule Take 500 mg by mouth 2 (two) times daily. Patient not taking: Reported on 05/14/2021    [provider]  cyanocobalamin (,VITAMIN B-12,) 1000 MCG/ML injection Inject 1,000 mcg into the muscle daily. 05/19/16   [provider]  dicyclomine (BENTYL) 10 MG  capsule Take 10 mg by mouth 2 (two) times daily as needed. 01/24/20   [provider]  guaiFENesin (MUCINEX) 600 MG 12 hr tablet Take 600 mg by mouth 2 times daily at 12 noon and 4 pm.    [provider]  PROAIR HFA 108 (90 Base) MCG/ACT inhaler USE 2 PUFFS EVERY 4 HOURS  AS NEEDED 11/03/18   Erin Fulling, MD  rOPINIRole (REQUIP) 0.5 MG tablet Take 0.5 mg by mouth 3 (three) times daily. 04/30/21   [provider]  Vitamin D, Ergocalciferol, (DRISDOL) 50000 UNITS CAPS capsule Take 1 capsule by mouth once a week. 12/20/14   [provider]    Allergies Aspirin, Cefuroxime axetil, Moxifloxacin hcl in nacl, Oxycodone, Sulfamethoxazole-trimethoprim, and Tramadol    Social History Social History   Tobacco Use   Smoking status: Former    Packs/day: 2.00    Years: 25.00    Pack years: 50.00    Types: Cigarettes    Quit date: 12/16/1963    Years since quitting: 57.4   Smokeless tobacco: Never  Substance Use Topics   Alcohol use: No    Alcohol/week: 0.0 standard drinks   Drug use: No    Review of Systems Patient denies headaches, rhinorrhea, blurry vision, numbness, shortness of breath, chest pain, edema, cough, abdominal pain, nausea, vomiting, diarrhea, dysuria, fevers, rashes or hallucinations unless otherwise stated above in HPI. ____________________________________________   PHYSICAL EXAM:  VITAL SIGNS: Vitals:   05/14/21 1729 05/14/21 1919  BP: (!) 168/84 (!) 182/101  Pulse: 78 98  Resp: 20 17  Temp:    SpO2: 100% 100%    Constitutional: Alert and oriented.  Eyes: Conjunctivae are normal.  Head: Atraumatic. Nose: No congestion/rhinnorhea. Mouth/Throat: Mucous membranes are moist.   Neck: No stridor. Painless ROM.  Cardiovascular: Normal rate, regular rhythm. Grossly normal heart sounds.  Good peripheral circulation. Respiratory: Normal respiratory effort.  No retractions. Lungs with coarse bs throughout Gastrointestinal: Soft and  nontender. No distention. No abdominal bruits. No CVA tenderness. Genitourinary:  Musculoskeletal: pain with log roll of both lue,  ttp of left knee, trace effusion. No warmth, or overlying erythema.  No joint effusions. Neurologic:  Normal speech and language. No gross focal neurologic deficits are appreciated. No facial droop Skin:  Skin is warm, dry and intact. No rash noted. Psychiatric: Mood and affect are normal. Speech and behavior are normal.  ____________________________________________   LABS (all labs ordered are listed, but only abnormal results are displayed)  Results for orders placed or performed during the hospital encounter of 05/14/21 (from the past 24 hour(s))  CBC     Status: Abnormal   Collection Time: 05/14/21 12:13 PM  Result Value Ref Range  WBC 7.0 4.0 - 10.5 K/uL   RBC 5.02 3.87 - 5.11 MIL/uL   Hemoglobin 12.3 12.0 - 15.0 g/dL   HCT 16.9 67.8 - 93.8 %   MCV 78.3 (L) 80.0 - 100.0 fL   MCH 24.5 (L) 26.0 - 34.0 pg   MCHC 31.3 30.0 - 36.0 g/dL   RDW 10.1 75.1 - 02.5 %   Platelets 309 150 - 400 K/uL   nRBC 0.0 0.0 - 0.2 %  Basic metabolic panel     Status: Abnormal   Collection Time: 05/14/21 12:13 PM  Result Value Ref Range   Sodium 139 135 - 145 mmol/L   Potassium 3.7 3.5 - 5.1 mmol/L   Chloride 100 98 - 111 mmol/L   CO2 29 22 - 32 mmol/L   Glucose, Bld 120 (H) 70 - 99 mg/dL   BUN 20 8 - 23 mg/dL   Creatinine, Ser 8.52 (H) 0.44 - 1.00 mg/dL   Calcium 8.8 (L) 8.9 - 10.3 mg/dL   GFR, Estimated 52 (L) >60 mL/min   Anion gap 10 5 - 15  Resp Panel by RT-PCR (Flu A&B, Covid) Nasopharyngeal Swab     Status: None   Collection Time: 05/14/21  4:59 PM   Specimen: Nasopharyngeal Swab; Nasopharyngeal(NP) swabs in vial transport medium  Result Value Ref Range   SARS Coronavirus 2 by RT PCR NEGATIVE NEGATIVE   Influenza A by PCR NEGATIVE NEGATIVE   Influenza B by PCR NEGATIVE NEGATIVE  Troponin I (High Sensitivity)     Status: None   Collection Time: 05/14/21   7:05 PM  Result Value Ref Range   Troponin I (High Sensitivity) 7 <18 ng/L   ____________________________________________  EKG My review and personal interpretation at Time: 15:55   Indication: fall  Rate: 80  Rhythm: sinus Axis: normal Other: normal intervals, no stemi ____________________________________________  RADIOLOGY  I personally reviewed all radiographic images ordered to evaluate for the above acute complaints and reviewed radiology reports and findings.  These findings were personally discussed with the patient.  Please see medical record for radiology report.  ____________________________________________   PROCEDURES  Procedure(s) performed:  Procedures    Critical Care performed: no ____________________________________________   INITIAL IMPRESSION / ASSESSMENT AND PLAN / ED COURSE  Pertinent labs & imaging results that were available during my care of the patient were reviewed by me and considered in my medical decision making (see chart for details).   DDX: fracture, contusion, sepsis, uti, dislocation, oa  ANDRIENNE HAVENER is a 87 y.o. who presents to the ED with presentation as described above.  Patient frail appearing no acute distress.  Mildly hypertensive but complains of mild pain.  Radiographs ordered for above differential without evidence of clear fracture but given her clinical presentation CT imaging was ordered.  Patient still having moderate pain blood work otherwise reassuring.  Has not provided urine sample at this point but if she is not able to walk and is coming from home alone typically ambulatory with a walker do believe she will require hospitalization for PT OT pain control and evaluation.     The patient was evaluated in Emergency Department today for the symptoms described in the history of present illness. He/she was evaluated in the context of the global COVID-19 pandemic, which necessitated consideration that the patient might be at  risk for infection with the SARS-CoV-2 virus that causes COVID-19. Institutional protocols and algorithms that pertain to the evaluation of patients at risk for COVID-19 are in  a state of rapid change based on information released by regulatory bodies including the CDC and federal and state organizations. These policies and algorithms were followed during the patient's care in the ED.  As part of my medical decision making, I reviewed the following data within the electronic MEDICAL RECORD NUMBER Nursing notes reviewed and incorporated, Labs reviewed, notes from prior ED visits and [place] Controlled Substance Database   ____________________________________________   FINAL CLINICAL IMPRESSION(S) / ED DIAGNOSES  Final diagnoses:  Fall, initial encounter  Bilateral hip pain  Acute pain of left knee      NEW MEDICATIONS STARTED DURING THIS VISIT:  New Prescriptions   No medications on file     Note:  This document was prepared using Dragon voice recognition software and may include unintentional dictation errors.    Willy Eddy, MD 05/14/21 2000

## 2021-05-14 NOTE — ED Notes (Signed)
Pt assisted to restroom and brief changed.  

## 2021-05-14 NOTE — ED Triage Notes (Signed)
Pt to ED [facility] from [facility] for fall in bathroom last night. C/o left knee pain. States has had knee problems, knee felt like giving out while in bathroom Pt on 2 L [place] chronic Denies hitting head or LOC.   When asked, pt also c/o of overall not feeling well

## 2021-05-14 NOTE — ED Notes (Signed)
Pt;s family c/o [name] updated on pt's status , made aware pt is admitted

## 2021-05-14 NOTE — H&P (Signed)
[place]   PATIENT NAME: Brittany Munoz    MR#:  [id number]  DATE OF BIRTH:  11-06-1927  DATE OF ADMISSION:  05/14/2021  PRIMARY CARE PHYSICIAN: Mick Sell, MD   Patient is coming from: [facility] REQUESTING/REFERRING PHYSICIAN: Willy Eddy, MD  CHIEF COMPLAINT:   Chief Complaint  Patient presents with   Fall    HISTORY OF PRESENT ILLNESS:  Brittany Munoz is a 87 y.o. Caucasian female with medical history significant for asthma, anxiety, COPD, cardiomyopathy, type 2 diabetes mellitus, hypertension and dyslipidemia, who presented to the emergency room with acute onset of mechanical fall that she had last night while she was in the bathroom.  She stated that her left knee felt like it was giving out.  She has since been feeling generally weak and complaining of bilateral hip pain as well as left knee pain.  She has not been able to ambulate with her walker due to pain.  She denies any head injuries.  No paresthesias or focal muscle weakness.  No dizziness or blurred vision, presyncope or syncope.  No chest pain or palpitations.  No nausea or vomiting or abdominal pain.    ED Course: Upon presentation to the emergency room, blood pressure was 152/97 and later 190/93 with otherwise normal vital signs.  Labs revealed unremarkable CBC and BMP.  High-sensitivity troponin I was 7.  Influenza antigens and COVID-19 PCR came back negative. EKG as reviewed by me : EKG showed sinus arrhythmia with a rate of 78 Imaging: Hip x-ray revealed no acute displaced fracture or dislocation both hips or pelvic fracture.  It showed right hip severe degenerative changes.  Knee x-ray showed advanced osteoarthritis changes in the left knees greatest at the lateral compartment with no acute osseous abnormalities.  CT of the pelvis without contrast revealed no acute displaced fracture of the bilateral hips and pelvis.  Showed severe degenerative changes of the right hip with diffusely  decreased bone density and scattered colonic diverticulosis with out diverticulitis.  Left knee CT showed severe 3 compartment osteoarthritis greatest in the lateral and patellofemoral compartments with small joint effusion, severe osteopenia and no acute displaced fracture.  She was given 25 mcg of IV fentanyl and 6 and 50 mg p.o. Tylenol.  The patient has not been able to use her walker to ambulate since her fall.  Will be admitted to a medical bed for further evaluation and management. PAST MEDICAL HISTORY:   Past Medical History:  Diagnosis Date   Anxiety    Asthma    Cardiomyopathy (HCC)    Cataract    COPD (chronic obstructive pulmonary disease) (HCC)    Degenerative arthritis    Depression    Diabetes (HCC)    Eczema    Gout    Hemorrhoids    Hyperlipidemia    Hypertension    Nocturnal hypoxia    Osteoporosis    Pulmonary fibrosis (HCC)    Seasonal allergies    Shingles     PAST SURGICAL HISTORY:   Past Surgical History:  Procedure Laterality Date   BLADDER REPAIR     BREAST CYST REMOVAL     BROKEN ANKLE     CARDIAC CATHETERIZATION     CHOLECYSTECTOMY     JOINT REPLACEMENT     KNEE ARTHROSCOPY     MOUTH SURGERY     OPEN REDUCTION INTERNAL FIXATION (ORIF) DISTAL RADIAL FRACTURE     REPAIR ROTATOR CUFF TEAR  SKIN CANCER REMOVED     RT KNEE AND FOREHEAD   TONSILLECTOMY AND ADENOIDECTOMY     WISDOM TOOTH REMOVAL      SOCIAL HISTORY:   Social History   Tobacco Use   Smoking status: Former    Packs/day: 2.00    Years: 25.00    Pack years: 50.00    Types: Cigarettes    Quit date: 12/16/1963    Years since quitting: 57.4   Smokeless tobacco: Never  Substance Use Topics   Alcohol use: No    Alcohol/week: 0.0 standard drinks    FAMILY HISTORY:   Family History  Problem Relation Age of Onset   Hypertension Mother    Asthma Mother    COPD Mother    Arthritis Mother    Colon cancer Father    Arthritis Sister    Prostate cancer Neg Hx    Bladder  Cancer Neg Hx    Kidney cancer Neg Hx     DRUG ALLERGIES:   Allergies  Allergen Reactions   Aspirin Anaphylaxis   Cefuroxime Axetil Swelling   Moxifloxacin Hcl In Nacl     Other reaction(s): Unknown   Oxycodone Nausea Only   Sulfamethoxazole-Trimethoprim     Other reaction(s): Unknown   Tramadol     Other reaction(s): Unknown    REVIEW OF SYSTEMS:   ROS As per history of present illness. All pertinent systems were reviewed above. Constitutional, HEENT, cardiovascular, respiratory, GI, GU, musculoskeletal, neuro, psychiatric, endocrine, integumentary and hematologic systems were reviewed and are otherwise negative/unremarkable except for positive findings mentioned above in the HPI.   MEDICATIONS AT HOME:   Prior to Admission medications   Medication Sig Start Date End Date Taking? Authorizing Provider  acetaminophen (TYLENOL) 650 MG CR tablet Take 1,300 mg by mouth 2 (two) times daily.   Yes [provider]  amLODipine (NORVASC) 5 MG tablet Take 5 mg by mouth daily. 05/01/21  Yes [provider]  budesonide (PULMICORT) 0.25 MG/2ML nebulizer solution Inhale 2 mLs into the lungs 2 (two) times daily as needed for wheezing or shortness of breath. 09/17/20  Yes [provider]  DULoxetine (CYMBALTA) 60 MG capsule Take 60 mg by mouth daily. 10/18/14  Yes [provider]  metoprolol succinate (TOPROL-XL) 25 MG 24 hr tablet Take 25 mg by mouth daily. 11/02/14 05/14/21 Yes [provider]  montelukast (SINGULAIR) 10 MG tablet Take 10 mg by mouth daily. 10/18/14  Yes [provider]  omeprazole (PRILOSEC) 40 MG capsule Take 40 mg by mouth daily. 10/18/14  Yes [provider]  torsemide (DEMADEX) 20 MG tablet Take 20 mg by mouth daily. 07/06/14  Yes [provider]  Acetaminophen 500 MG coapsule Take 500 mg by mouth 2 (two) times daily. Patient not taking: Reported on 05/14/2021    [provider]  cyanocobalamin  (,VITAMIN B-12,) 1000 MCG/ML injection Inject 1,000 mcg into the muscle daily. 05/19/16   [provider]  dicyclomine (BENTYL) 10 MG capsule Take 10 mg by mouth 2 (two) times daily as needed. 01/24/20   [provider]  guaiFENesin (MUCINEX) 600 MG 12 hr tablet Take 600 mg by mouth 2 times daily at 12 noon and 4 pm.    [provider]  PROAIR HFA 108 (90 Base) MCG/ACT inhaler USE 2 PUFFS EVERY 4 HOURS  AS NEEDED 11/03/18   Erin Fulling, MD  rOPINIRole (REQUIP) 0.5 MG tablet Take 0.5 mg by mouth 3 (three) times daily. 04/30/21   [provider]  Vitamin D, Ergocalciferol, (DRISDOL) 50000 UNITS CAPS capsule Take 1 capsule by mouth once a week. 12/20/14   [provider]      VITAL SIGNS:  Blood pressure (!) 175/97, pulse 94, temperature 98 F (36.7 C), temperature source Oral, resp. rate 17, height 5\' 8"  (1.727 m), weight 76.7 kg, SpO2 100 %.  PHYSICAL EXAMINATION:  Physical Exam  GENERAL:  87 y.o.-year-old Caucasian female patient lying in the bed with no acute distress.  EYES: Pupils equal, round, reactive to light and accommodation. No scleral icterus. Extraocular muscles intact.  HEENT: Head atraumatic, normocephalic. Oropharynx and nasopharynx clear.  NECK:  Supple, no jugular venous distention. No thyroid enlargement, no tenderness.  LUNGS: Normal breath sounds bilaterally, no wheezing, rales,rhonchi or crepitation. No use of accessory muscles of respiration.  CARDIOVASCULAR: Regular rate and rhythm, S1, S2 normal. No murmurs, rubs, or gallops.  ABDOMEN: Soft, nondistended, nontender. Bowel sounds present. No organomegaly or mass.  EXTREMITIES: No pedal edema, cyanosis, or clubbing. Musculoskeletal: Bilateral hip tenderness on palpation.  Left knee swelling and tenderness with diminished range of motion due to pain. NEUROLOGIC: Cranial nerves II through XII are intact. Muscle strength 5/5 in all extremities. Sensation intact. Gait not checked.   PSYCHIATRIC: The patient is alert and oriented x 3.  Normal affect and good eye contact. SKIN: No obvious rash, lesion, or ulcer.   LABORATORY PANEL:   CBC Recent Labs  Lab 05/14/21 1213  WBC 7.0  HGB 12.3  HCT 39.3  PLT 309   ------------------------------------------------------------------------------------------------------------------  Chemistries  Recent Labs  Lab 05/14/21 1213  NA 139  K 3.7  CL 100  CO2 29  GLUCOSE 120*  BUN 20  CREATININE 1.02*  CALCIUM 8.8*   ------------------------------------------------------------------------------------------------------------------  Cardiac Enzymes No results for input(s): TROPONINI in the last 168 hours. ------------------------------------------------------------------------------------------------------------------  RADIOLOGY:  DG Knee 1-2 Views Left  Result Date: 05/14/2021 CLINICAL DATA:  07/14/2021 in bathroom last night, LEFT knee pain EXAM: LEFT KNEE - 1-2 VIEW COMPARISON:  None FINDINGS: Osseous demineralization. Advanced degenerative changes LEFT knee with joint space narrowing and spur formation, greatest at lateral compartment where bone-on-bone appearance is seen. No acute fracture, dislocation, or bone destruction. No joint effusion. IMPRESSION: Osseous demineralization with advanced osteoarthritic changes LEFT knee greatest at lateral compartment. No acute osseous abnormalities. Electronically Signed   By: Larey Seat M.D.   On: 05/14/2021 13:26   CT PELVIS WO CONTRAST  Result Date: 05/14/2021 CLINICAL DATA:  Pelvic trauma.  Status post fall. EXAM: CT PELVIS WITHOUT CONTRAST TECHNIQUE: Multidetector CT imaging of the pelvis was performed following the standard protocol without intravenous contrast. COMPARISON:  CT abdomen pelvis 09/09/2016 FINDINGS: Urinary Tract:  No abnormality visualized. Bowel: Scattered colonic diverticulosis unremarkable visualized pelvic bowel loops. Vascular/Lymphatic: Atherosclerotic  plaque. No pathologically enlarged lymph nodes. No significant vascular abnormality seen. Reproductive:  No mass or other significant abnormality Other: No intraperitoneal free fluid. No intraperitoneal free gas. No organized fluid collection. Musculoskeletal: Diffusely decreased bone density. No suspicious bone lesions identified. No acute displaced fracture. Severe degenerative changes of the right femoroacetabular joint. Mild degenerative changes of the left femoroacetabular joint. Likely old healed fracture of the S2/S1 level. IMPRESSION: 1. No acute displaced fracture of the bilateral hips and pelvis. No pelvic diastasis. 2. Severe degenerative changes of the right hip in a patient with diffusely decreased bone density. 3. Scattered colonic diverticulosis with no acute diverticulitis. Electronically Signed   By: 11/07/2016 M.D.   On: 05/14/2021 17:40  CT Knee Left Wo Contrast  Result Date: 05/14/2021 CLINICAL DATA:  Larey Seat yesterday, left knee pain, inability to straighten left knee EXAM: CT OF THE LEFT KNEE WITHOUT CONTRAST TECHNIQUE: Multidetector CT imaging of the LEFT knee was performed according to the standard protocol. Multiplanar CT image reconstructions were also generated. COMPARISON:  05/14/2021 FINDINGS: Bones/Joint/Cartilage The left knee is held in flexion during imaging. The bones are diffusely osteopenic. There are no acute displaced fractures. There is severe 3 compartmental osteoarthritis with bone on bone contact in the lateral compartment. There is severe joint space narrowing within the lateral compartment of the patellofemoral compartment. There is a trace suprapatellar scratch at there is a small suprapatellar effusion, likely reactive. Ligaments Suboptimally assessed by CT. Muscles and Tendons Diffuse muscular atrophy.  No gross abnormalities. Soft tissues Unremarkable. Reconstructed images demonstrate no additional findings. IMPRESSION: 1. Severe 3 compartmental osteoarthritis  greatest in the lateral and patellofemoral compartments as above. 2. No acute displaced fracture. 3. Small joint effusion. 4. Severe osteopenia. Electronically Signed   By: Sharlet Salina M.D.   On: 05/14/2021 17:39   DG Hip Unilat W or Wo Pelvis 2-3 Views Left  Result Date: 05/14/2021 CLINICAL DATA:  Fall. EXAM: DG HIP (WITH OR WITHOUT PELVIS) 2-3V LEFT; DG HIP (WITH OR WITHOUT PELVIS) 2-3V RIGHT COMPARISON:  CT abdomen pelvis 08/10/2012 FINDINGS: Severe degenerative changes of the right femoroacetabular joint. At least mild to moderate degenerative changes of the left acetabulofemoral joint. There is no evidence of hip fracture or dislocation. No aggressive appearing focal bone abnormality. Atherosclerotic plaque. IMPRESSION: 1. No acute displaced fracture or dislocation of bilateral hips. 2. No acute displaced fracture or diastasis of the bones of the pelvis. 3. Severe degenerative changes of the right hip. Electronically Signed   By: Tish Frederickson M.D.   On: 05/14/2021 15:56   DG Hip Unilat W or Wo Pelvis 2-3 Views Right  Result Date: 05/14/2021 CLINICAL DATA:  Fall. EXAM: DG HIP (WITH OR WITHOUT PELVIS) 2-3V LEFT; DG HIP (WITH OR WITHOUT PELVIS) 2-3V RIGHT COMPARISON:  CT abdomen pelvis 08/10/2012 FINDINGS: Severe degenerative changes of the right femoroacetabular joint. At least mild to moderate degenerative changes of the left acetabulofemoral joint. There is no evidence of hip fracture or dislocation. No aggressive appearing focal bone abnormality. Atherosclerotic plaque. IMPRESSION: 1. No acute displaced fracture or dislocation of bilateral hips. 2. No acute displaced fracture or diastasis of the bones of the pelvis. 3. Severe degenerative changes of the right hip. Electronically Signed   By: Tish Frederickson M.D.   On: 05/14/2021 15:56      IMPRESSION AND PLAN:  Active Problems:   Bilateral hip pain  1.  Bilateral hip pain and left knee pain status post mechanical fall with subsequent  inability to ambulate and persistent pain. - The patient will be admitted to a medical bed. - Pain management will be provided. - We will obtain a physical therapy consultation. - Social worker consult will be obtained to assess the need for further rehabilitation postdischarge.  2.  Essential hypertension. - We will continue amlodipine and Toprol-XL.  3.  COPD. - We will continue Pulmicort and her albuterol.  4.  Restless leg syndrome. - We will continue her Requip.  5.  Vitamin B12 deficiency and vitamin D deficiency. - Vitamin B12 and vitamin D will be resumed.  DVT prophylaxis: Lovenox.  Code Status: full code.  Family Communication:  The plan of care was discussed in details with the patient (and family).  I answered all questions. The patient agreed to proceed with the above mentioned plan. Further management will depend upon hospital course. Disposition Plan: Back to previous home environment Consults called: none.  All the records are reviewed and case discussed with ED provider.  Status is: Inpatient  Remains inpatient appropriate because:Ongoing active pain requiring inpatient pain management, Ongoing diagnostic testing needed not appropriate for outpatient work up, Unsafe d/c plan, IV treatments appropriate due to intensity of illness or inability to take PO, and Inpatient level of care appropriate due to severity of illness  Dispo: The patient is from: Home              Anticipated d/c is to: SNF              Patient currently is not medically stable to d/c.   Difficult to place patient No    TOTAL TIME TAKING CARE OF THIS PATIENT: 50 minutes.    Hannah Beat M.D on 05/14/2021 at 9:07 PM  [facility]   From 7 PM-7 AM, contact night-coverage [email or web address]  CC: Primary care physician; Mick Sell, MD

## 2021-05-14 NOTE — ED Notes (Signed)
Pure wick in place 

## 2021-05-15 ENCOUNTER — Encounter: Payer: Self-pay | Admitting: Family Medicine

## 2021-05-15 DIAGNOSIS — M25552 Pain in left hip: Secondary | ICD-10-CM | POA: Diagnosis not present

## 2021-05-15 DIAGNOSIS — M25551 Pain in right hip: Secondary | ICD-10-CM | POA: Diagnosis not present

## 2021-05-15 LAB — GLUCOSE, CAPILLARY
Glucose-Capillary: 99 mg/dL (ref 70–99)
Glucose-Capillary: 93 mg/dL (ref 70–99)
Glucose-Capillary: 163 mg/dL — ABNORMAL HIGH (ref 70–99)
Glucose-Capillary: 96 mg/dL (ref 70–99)
Glucose-Capillary: 120 mg/dL — ABNORMAL HIGH (ref 70–99)

## 2021-05-15 LAB — HEMOGLOBIN A1C
Hgb A1c MFr Bld: 6 % — ABNORMAL HIGH (ref 4.8–5.6)
Mean Plasma Glucose: 126 mg/dL

## 2021-05-15 MED ORDER — INSULIN ASPART 100 UNIT/ML IJ SOLN
0.0000 [IU] | Freq: Three times a day (TID) | INTRAMUSCULAR | Status: DC
  Administered 2021-05-15: 2 [IU] via SUBCUTANEOUS
  Administered 2021-05-16: 1 [IU] via SUBCUTANEOUS
  Filled 2021-05-15 (×2): qty 1

## 2021-05-15 MED ORDER — HYDRALAZINE HCL 25 MG PO TABS: 25.0000 mg | ORAL_TABLET | Freq: Four times a day (QID) | ORAL | Status: DC | PRN

## 2021-05-15 MED ORDER — FENTANYL CITRATE PF 50 MCG/ML IJ SOSY: 12.5000 ug | PREFILLED_SYRINGE | INTRAMUSCULAR | Status: DC | PRN

## 2021-05-15 MED ORDER — OXYCODONE HCL 5 MG PO TABS
2.5000 mg | ORAL_TABLET | Freq: Four times a day (QID) | ORAL | Status: DC | PRN
  Administered 2021-05-15 (×2): 2.5 mg via ORAL
  Filled 2021-05-15 (×2): qty 1

## 2021-05-15 NOTE — Evaluation (Signed)
Physical Therapy Evaluation Patient Details Name: Brittany Munoz MRN: [id number] DOB: 26-Mar-1928 Today's Date: 05/15/2021  History of Present Illness  Brittany Munoz is a 87 y.o. Caucasian female with medical history significant for asthma, anxiety, COPD, cardiomyopathy, type 2 diabetes mellitus, hypertension and dyslipidemia, who presented to the emergency room with acute onset of mechanical fall that she had last night while she was in the bathroom. Reporting B Hip pain and L knee pain. Imaging negative.  Clinical Impression  Patient received in recliner. Agrees to PT assessment. Reports "I don't know if I can walk, I'm so shaky." Patient requires min assist for sit to stand from recliner. Min guard for ambulation of 15 feet with RW. She has left knee pain with mobility with is limiting as well as fatigue and sob. Patient will continue to benefit from skilled PT while here to improve functional mobility and independence to return to ILF at discharge if possible.          Recommendations for follow up therapy are one component of a multi-disciplinary discharge planning process, led by the attending physician.  Recommendations may be updated based on patient status, additional functional criteria and insurance authorization.  Follow Up Recommendations Home health PT;Supervision - Intermittent    Equipment Recommendations  None recommended by PT    Recommendations for Other Services       Precautions / Restrictions Precautions Precautions: Fall Restrictions Weight Bearing Restrictions: No      Mobility  Bed Mobility Overal bed mobility: Needs Assistance Bed Mobility: Sit to Supine     Supine to sit: Mod assist Sit to supine: Min assist   General bed mobility comments: min assist to bring legs back up into bed, +2 assist to scoot up to head of bed once in supine    Transfers Overall transfer level: Needs assistance Equipment used: Rolling walker (2 wheeled) Transfers:  Sit to/from Stand Sit to Stand: Min assist Stand pivot transfers: Min guard          Ambulation/Gait Ambulation/Gait assistance: Min guard Gait Distance (Feet): 15 Feet Assistive device: Rolling walker (2 wheeled) Gait Pattern/deviations: Step-through pattern;Decreased step length - right;Decreased step length - left;Decreased weight shift to left;Shuffle;Trunk flexed Gait velocity: decr   General Gait Details: patient ambulates with slow, shuffle steps.Steady with RW. Has left knee pain with mobility.  Stairs            Wheelchair Mobility    Modified Rankin (Stroke Patients Only)       Balance Overall balance assessment: Modified Independent Sitting-balance support: Feet supported Sitting balance-Leahy Scale: Good     Standing balance support: Bilateral upper extremity supported;During functional activity Standing balance-Leahy Scale: Fair Standing balance comment: needs B UE support                             Pertinent Vitals/Pain Pain Assessment: Faces Faces Pain Scale: Hurts little more Pain Location: L knee Pain Descriptors / Indicators: Dull;Discomfort;Grimacing Pain Intervention(s): Monitored during session;Limited activity within patient's tolerance;Repositioned    Home Living Family/patient expects to be discharged to:: Assisted living               Home Equipment: Walker - 4 wheels;Bedside commode;Shower seat;Grab bars - toilet;Grab bars - tub/shower;Electric scooter Additional Comments: Pt lives at [facility]    Prior Function Level of Independence: Independent with assistive device(s)         Comments: assist for meals/housekeeping.  MOD I using [id number] for mobility and ADLs. No falls in last 3 months other than admitting fall. Reports she does not walk much, has scooter as well.     Hand Dominance   Dominant Hand: Right    Extremity/Trunk Assessment   Upper Extremity Assessment Upper Extremity Assessment: Defer to  OT evaluation    Lower Extremity Assessment Lower Extremity Assessment: Generalized weakness;LLE deficits/detail LLE Deficits / Details: pain in left knee LLE Coordination: decreased gross motor    Cervical / Trunk Assessment Cervical / Trunk Assessment: Kyphotic  Communication   Communication: HOH  Cognition Arousal/Alertness: Awake/alert Behavior During Therapy: WFL for tasks assessed/performed Overall Cognitive Status: Within Functional Limits for tasks assessed                                 General Comments: A&O x4, very pleasant and good safety awareness      General Comments General comments (skin integrity, edema, etc.): Setaed: BP 154/78, MAP 101, HR 99, SpO2 99% on 2L [place]    Exercises Other Exercises Other Exercises: Pt educated re: OT role, DME recs, d/c recs, falls prevention, ECS, toileting schedule Other Exercises: grooming, sup>sit, sit<>stand, SPT, sitting/standing balance/tolerance   Assessment/Plan    PT Assessment Patient needs continued PT services  PT Problem List Decreased strength;Decreased mobility;Decreased activity tolerance;Pain;Decreased range of motion;Cardiopulmonary status limiting activity       PT Treatment Interventions Therapeutic activities;Gait training;Therapeutic exercise;Functional mobility training;Patient/family education    PT Goals (Current goals can be found in the Care Plan section)  Acute Rehab PT Goals Patient Stated Goal: to return to walking, decrease pain PT Goal Formulation: With patient Time For Goal Achievement: 05/22/21 Potential to Achieve Goals: Fair    Frequency Min 2X/week   Barriers to discharge Decreased caregiver support lives in ILF    Co-evaluation               AM-PAC PT "6 Clicks" Mobility  Outcome Measure Help needed turning from your back to your side while in a flat bed without using bedrails?: A Little Help needed moving from lying on your back to sitting on the side of a  flat bed without using bedrails?: A Little Help needed moving to and from a bed to a chair (including a wheelchair)?: A Little Help needed standing up from a chair using your arms (e.g., wheelchair or bedside chair)?: A Little Help needed to walk in hospital room?: A Little Help needed climbing 3-5 steps with a railing? : A Lot 6 Click Score: 17    End of Session Equipment Utilized During Treatment: Gait belt;Oxygen Activity Tolerance: Patient limited by pain;Patient limited by fatigue Patient left: in bed;with bed alarm set;with call bell/phone within reach Nurse Communication: Mobility status PT Visit Diagnosis: Unsteadiness on feet (R26.81);Muscle weakness (generalized) (M62.81);Difficulty in walking, not elsewhere classified (R26.2);History of falling (Z91.81);Pain Pain - Right/Left: Left Pain - part of body: Knee    Time: 1035-1059 PT Time Calculation (min) (ACUTE ONLY): 24 min   Charges:   PT Evaluation $PT Eval Moderate Complexity: 1 Mod PT Treatments $Gait Training: 8-22 mins        [employer], PT, GCS 05/15/21,11:09 AM

## 2021-05-15 NOTE — TOC Progression Note (Addendum)
Transition of Care [facility]) - Progression Note    Patient Details  Name: Brittany Munoz MRN: [id number] Date of Birth: 11-10-1927  Transition of Care [facility]) CM/SW Alligator, RN Phone Number: 05/15/2021, 1:39 PM  Clinical Narrative:     Met with the patient and the daughter in law in the room, I explained that [facility] agreed to have PT and OT work with the patient 5 days per week, I will fax orders to them, They are both in agreement that the patient will return to [facility], her daughter in law will go to the ILF today and clean out the apartment to anticipate the return of the patient maybe tomorrow, faxed the [facility] orders to [phone]       Expected Discharge Plan and Services                                                 Social Determinants of Health (SDOH) Interventions    Readmission Risk Interventions No flowsheet data found.

## 2021-05-15 NOTE — [place] FL2 (Signed)
MEDICAID FL2 LEVEL OF CARE SCREENING TOOL     IDENTIFICATION  Patient Name: Brittany Munoz Birthdate: 22-Mar-1928 Sex: female Admission Date (Current Location): 05/14/2021  [facility] and [place] Number:  Chiropodist and Address:  [facility], [address], [place], [place] [address]      Provider Number: [id number]  Attending Physician Name and Address:  [place], Eric J, DO  Relative Name and Phone Number:  [name] [phone]    Current Level of Care: Hospital Recommended Level of Care: Skilled Nursing Facility Prior Approval Number:    Date Approved/Denied:   PASRR Number: [id number]  Discharge Plan: SNF    Current Diagnoses: Patient Active Problem List   Diagnosis Date Noted   Bilateral hip pain 05/14/2021   Hyperlipidemia, unspecified 12/24/2016   Osteoporosis 12/24/2016   Pulmonary fibrosis (HCC) 12/24/2016   Wrist fracture, left 12/24/2016   Tachy-brady syndrome (HCC) 11/18/2016   B12 deficiency 11/03/2016   Chronic constipation 08/29/2016   Dyspnea 03/14/2016   Abdominal pain, RLQ (right lower quadrant) 10/08/2015   Chronic hyperglycemia 10/08/2015   COPD, moderate (HCC) 01/10/2015   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) 01/10/2015   Anemia, unspecified 10/18/2013   Chronic obstructive airway disease with asthma (HCC) 10/18/2013   Benign essential hypertension 10/18/2013   Osteoarthrosis involving lower leg 10/18/2013   Vitamin D deficiency, unspecified 10/18/2013    Orientation RESPIRATION BLADDER Height & Weight     Self, Time, Situation, Place  O2 (2 liters chronic) External catheter, Continent Weight: 76.7 kg Height:  5\' 8"  (172.7 cm)  BEHAVIORAL SYMPTOMS/MOOD NEUROLOGICAL BOWEL NUTRITION STATUS      Continent Diet (carb modified)  AMBULATORY STATUS COMMUNICATION OF NEEDS Skin   Extensive Assist Verbally Normal                       Personal Care Assistance Level of  Assistance  Bathing, Feeding, Dressing Bathing Assistance: Limited assistance Feeding assistance: Independent Dressing Assistance: Limited assistance     Functional Limitations Info             SPECIAL CARE FACTORS FREQUENCY  PT (By licensed PT), OT (By licensed OT)     PT Frequency: 5 times per week OT Frequency: 5 times per week            Contractures Contractures Info: Not present    Additional Factors Info  Code Status, Allergies Code Status Info: Full code Allergies Info: Aspirin, Cefuroxime Axetil, Moxifloxacin Hcl In Nacl, Oxycodone, Sulfamethoxazole-trimethoprim, Tramadol           Current Medications (05/15/2021):  This is the current hospital active medication list Current Facility-Administered Medications  Medication Dose Route Frequency Provider Last Rate Last Admin   0.9 %  sodium chloride infusion  250 mL Intravenous PRN Mansy, Jan A, MD       acetaminophen (TYLENOL) tablet 650 mg  650 mg Oral Q6H PRN Mansy, Jan A, MD       Or   acetaminophen (TYLENOL) suppository 650 mg  650 mg Rectal Q6H PRN Mansy, Jan A, MD       albuterol (PROVENTIL) (2.5 MG/3ML) 0.083% nebulizer solution 3 mL  3 mL Nebulization Q4H PRN Mansy, Jan A, MD       amLODipine (NORVASC) tablet 5 mg  5 mg Oral QHS Mansy, Jan A, MD   5 mg at 05/14/21 2318   budesonide (PULMICORT) nebulizer solution 0.25 mg  2 mL Inhalation BID  PRN Mansy, Vernetta Honey, MD       [START ON 06/04/2021] cyanocobalamin ((VITAMIN B-12)) injection 1,000 mcg  1,000 mcg Intramuscular Q30 days Mansy, Jan A, MD       dicyclomine (BENTYL) capsule 10 mg  10 mg Oral BID PRN Mansy, Jan A, MD       DULoxetine (CYMBALTA) DR capsule 60 mg  60 mg Oral Daily Mansy, Jan A, MD       enoxaparin (LOVENOX) injection 40 mg  40 mg Subcutaneous QHS Mansy, Jan A, MD   40 mg at 05/14/21 2317   fentaNYL (SUBLIMAZE) injection 12.5 mcg  12.5 mcg Intravenous Q4H PRN Mansy, Jan A, MD   12.5 mcg at 05/15/21 0523   guaiFENesin (MUCINEX) 12 hr tablet 600  mg  600 mg Oral BID PRN Mansy, Jan A, MD       insulin aspart (novoLOG) injection 0-9 Units  0-9 Units Subcutaneous TID AC & HS Mansy, Jan A, MD       magnesium hydroxide (MILK OF MAGNESIA) suspension 30 mL  30 mL Oral Daily PRN Mansy, Jan A, MD       metoprolol succinate (TOPROL-XL) 24 hr tablet 25 mg  25 mg Oral Daily Mansy, Jan A, MD   25 mg at 05/14/21 2322   montelukast (SINGULAIR) tablet 10 mg  10 mg Oral QHS Mansy, Jan A, MD   10 mg at 05/14/21 2317   ondansetron (ZOFRAN) tablet 4 mg  4 mg Oral Q6H PRN Mansy, Jan A, MD       Or   ondansetron [facility]) injection 4 mg  4 mg Intravenous Q6H PRN Mansy, Jan A, MD       pantoprazole (PROTONIX) EC tablet 40 mg  40 mg Oral Daily Mansy, Jan A, MD       rOPINIRole (REQUIP) tablet 0.5 mg  0.5 mg Oral TID Mansy, Jan A, MD       sodium chloride flush (NS) 0.9 % injection 3 mL  3 mL Intravenous Q12H Mansy, Jan A, MD   3 mL at 05/14/21 2323   sodium chloride flush (NS) 0.9 % injection 3 mL  3 mL Intravenous PRN Mansy, Jan A, MD       torsemide [facility]) tablet 20 mg  20 mg Oral Daily Mansy, Jan A, MD   20 mg at 05/14/21 2323   traZODone (DESYREL) tablet 25 mg  25 mg Oral QHS PRN Mansy, Vernetta Honey, MD       [START ON 05/17/2021] Vitamin D (Ergocalciferol) (DRISDOL) capsule 50,000 Units  50,000 Units Oral Weekly Mansy, Vernetta Honey, MD         Discharge Medications: Please see discharge summary for a list of discharge medications.  Relevant Imaging Results:  Relevant Lab Results:   Additional Information SS# [id number]  Marlowe Sax, RN

## 2021-05-15 NOTE — Plan of Care (Signed)
Patient sleeping between care. PRN pain meds given for left knee and b/l hip. No urine output overnight. Bladder showed . Straight cath'd pt for 900 ml. Plan of care reviewed with patient. Call bell within reach.  PLAN OF CARE ONGOING Problem: Education: Goal: Knowledge of General Education information will improve Description: Including pain rating scale, medication(s)/side effects and non-pharmacologic comfort measures Outcome: Progressing   Problem: Health Behavior/Discharge Planning: Goal: Ability to manage health-related needs will improve Outcome: Progressing   Problem: Clinical Measurements: Goal: Ability to maintain clinical measurements within normal limits will improve Outcome: Progressing Goal: Will remain free from infection Outcome: Progressing Goal: Diagnostic test results will improve Outcome: Progressing Goal: Respiratory complications will improve Outcome: Progressing Goal: Cardiovascular complication will be avoided Outcome: Progressing   Problem: Activity: Goal: Risk for activity intolerance will decrease Outcome: Progressing   Problem: Nutrition: Goal: Adequate nutrition will be maintained Outcome: Progressing   Problem: Coping: Goal: Level of anxiety will decrease Outcome: Progressing   Problem: Elimination: Goal: Will not experience complications related to bowel motility Outcome: Progressing Goal: Will not experience complications related to urinary retention Outcome: Progressing   Problem: Pain Managment: Goal: General experience of comfort will improve Outcome: Progressing   Problem: Safety: Goal: Ability to remain free from injury will improve Outcome: Progressing   Problem: Skin Integrity: Goal: Risk for impaired skin integrity will decrease Outcome: Progressing

## 2021-05-15 NOTE — TOC Progression Note (Addendum)
Transition of Care [facility]) - Progression Note    Patient Details  Name: Brittany Munoz MRN: [id number] Date of Birth: [birth year]  Transition of Care [facility]) CM/SW Briny [place], RN Phone Number: 05/15/2021, 9:00 AM  Clinical Narrative:   Met with the patient in the room to discuss DC plan and needs She lives at [place], she wears oxygen 2 liters chronically, she walks normally with a walker, She is not wanting to go to [facility] SNF, she stated that she does not like going to new places, She will discuss with her daughter in law Mariann Laster when she arrives today, She is agreeable to a bed search to see what facilities has a bed,  Passr obtained, FL2 completed, Bed search sent  Update, Spoke to [place] at [facility] and she confirms that with PT/ OT orders they will work with her daily and can assist in getting out of bed, she can return to [place] at [employer] and [place] [facility] Interventions    Readmission Risk Interventions No flowsheet data found.

## 2021-05-15 NOTE — Evaluation (Signed)
Occupational Therapy Evaluation Patient Details Name: Brittany Munoz MRN: [id number] DOB: [birth year] Today's Date: 05/15/2021   History of Present Illness Brittany Munoz is a 87 y.o. Caucasian female with medical history significant for asthma, anxiety, COPD, cardiomyopathy, type 2 diabetes mellitus, hypertension and dyslipidemia, who presented to the emergency room with acute onset of mechanical fall that she had last night while she was in the bathroom.   Clinical Impression   Ms Barbier was seen for OT evaluation this date. Prior to hospital admission, pt was MOD I for mobility and ADLs using 4WW. Pt lives at [facility] IL[facility]. Pt presents to acute OT demonstrating impaired ADL performance and functional mobility 2/2 decreased activity tolerance and decreased LB access. Pt currently requires MOD A exit R sid eof bed.  SETUP + SUPERVISION grooming seated EOB. MAX A for LB access at bed level. MIN A + RW for simulated BSC t/f - assist for eccentric control. Pt would benefit from skilled OT to address noted impairments and functional limitations (see below for any additional details) in order to maximize safety and independence while minimizing falls risk and caregiver burden. Upon hospital discharge, recommend HHOT to maximize pt safety and return to functional independence during meaningful occupations of daily life.       Recommendations for follow up therapy are one component of a multi-disciplinary discharge planning process, led by the attending physician.  Recommendations may be updated based on patient status, additional functional criteria and insurance authorization.   Follow Up Recommendations  Home health OT;Supervision/Assistance - 24 hour (initial 24/7)    Equipment Recommendations  3 in 1 bedside commode    Recommendations for Other Services       Precautions / Restrictions Precautions Precautions: Fall Restrictions Weight Bearing Restrictions: No      Mobility Bed  Mobility Overal bed mobility: Needs Assistance Bed Mobility: Supine to Sit     Supine to sit: Mod assist          Transfers Overall transfer level: Needs assistance Equipment used: Rolling walker (2 wheeled) Transfers: Sit to/from [employer] Transfers Sit to Stand: Min assist;From elevated surface Stand pivot transfers: Min guard            Balance Overall balance assessment: Needs assistance Sitting-balance support: No upper extremity supported;Feet supported Sitting balance-Leahy Scale: Good     Standing balance support: Bilateral upper extremity supported Standing balance-Leahy Scale: Fair                             ADL either performed or assessed with clinical judgement   ADL Overall ADL's : Needs assistance/impaired                                       General ADL Comments: SETUP + SUPERVISION grooming seated EOB. MAX A for LB access at bed level. MIN A + RW for simulated BSC t/f - assist for eccentric control.     Vision         Perception     Praxis      Pertinent Vitals/Pain Pain Assessment: Faces Faces Pain Scale: Hurts little more Pain Location: L knee Pain Descriptors / Indicators: Dull;Discomfort;Grimacing Pain Intervention(s): Limited activity within patient's tolerance;Premedicated before session;Repositioned     Hand Dominance Right   Extremity/Trunk Assessment Upper Extremity Assessment Upper Extremity Assessment: Overall WFL for  tasks assessed   Lower Extremity Assessment Lower Extremity Assessment: Generalized weakness       Communication Communication Communication: HOH   Cognition Arousal/Alertness: Awake/alert Behavior During Therapy: WFL for tasks assessed/performed Overall Cognitive Status: Within Functional Limits for tasks assessed                                 General Comments: A&O x4, very pleasant and good safety awareness   General Comments  Setaed: BP  154/78, MAP 101, HR 99, SpO2 99% on 2L [place]    Exercises Exercises: Other exercises Other Exercises Other Exercises: Pt educated re: OT role, DME recs, d/c recs, falls prevention, ECS, toileting schedule Other Exercises: grooming, sup>sit, sit<>stand, SPT, sitting/standing balance/tolerance   Shoulder Instructions      Home Living Family/patient expects to be discharged to:: Assisted living                             Home Equipment: Walker - 4 wheels;Bedside commode;Shower seat;Grab bars - toilet;Grab bars - tub/shower;Electric scooter   Additional Comments: Pt lives at [facility] IL[facility]      Prior Functioning/Environment Level of Independence: Independent with assistive device(s)        Comments: assist for meals/housekeeping. MOD I using [id number] for mobility and ADLs. No falls in last 3 months other than admitting fall        OT Problem List: Decreased strength;Decreased range of motion;Decreased activity tolerance;Impaired balance (sitting and/or standing);Decreased safety awareness      OT Treatment/Interventions: Self-care/ADL training;Therapeutic exercise;Energy conservation;DME and/or AE instruction;Therapeutic activities;Patient/family education;Balance training    OT Goals(Current goals can be found in the care plan section) Acute Rehab OT Goals Patient Stated Goal: to return to walking OT Goal Formulation: With patient Time For Goal Achievement: 05/29/21 Potential to Achieve Goals: Good ADL Goals Pt Will Perform Grooming: with modified independence;standing (c LRAD PRN) Pt Will Perform Lower Body Dressing: with min assist;with adaptive equipment;sit to/from stand (c LRAD PRN) Pt Will Transfer to Toilet: with modified independence;ambulating;bedside commode (c LRAD PRN) Pt Will Perform Toileting - Clothing Manipulation and hygiene: with modified independence;sitting/lateral leans  OT Frequency: Min 2X/week   Barriers to D/C:            Co-evaluation               AM-PAC OT "6 Clicks" Daily Activity     Outcome Measure Help from another person eating meals?: None Help from another person taking care of personal grooming?: None Help from another person toileting, which includes using toliet, bedpan, or urinal?: A Little Help from another person bathing (including washing, rinsing, drying)?: A Little Help from another person to put on and taking off regular upper body clothing?: None Help from another person to put on and taking off regular lower body clothing?: A Lot 6 Click Score: 20   End of Session Equipment Utilized During Treatment: Rolling walker;Gait belt Nurse Communication: Mobility status  Activity Tolerance: Patient tolerated treatment well Patient left: in chair;with call bell/phone within reach;with chair alarm set  OT Visit Diagnosis: Other abnormalities of gait and mobility (R26.89);Muscle weakness (generalized) (M62.81)                Time: 7829-5621 OT Time Calculation (min): 29 min Charges:  OT General Charges $OT Visit: 1 Visit OT Evaluation $OT Eval Low Complexity: 1 Low OT Treatments $Self  Care/Home Management : 8-22 mins  Kathie Dike, M.S. OTR/L  05/15/21, 11:24 AM  ascom [phone]

## 2021-05-15 NOTE — Progress Notes (Signed)
PROGRESS NOTE    Brittany Munoz  STM:[id number] DOB: [birth year] DOA: 05/14/2021 PCP: Mick Sell, MD    Brief Narrative:  Brittany Munoz is a 87 year old female with past medical history significant for asthma/COPD, anxiety, type 2 diabetes mellitus, essential hypertension, dyslipidemia, chronic combined systolic/diastolic congestive heart failure, who presented to [facility] ED on 10/11 with hip pain following mechanical fall at home.  Patient reports she was in the bathroom in which she reports her left knee felt like it was going to give out with associated generalized weakness with fall on her left side.  Patient complaining of bilateral hip pain and left knee pain.  Unable to ambulate due to pain with her walker at home.  Denies syncopal episode or loss of consciousness.  Denies dizziness, no blurred vision, no chest pain/palpitations, no nausea/vomiting/diarrhea or abdominal pain.  In the ED, temperature 98.0 F, HR 99, RR 20, BP 152/97, SPO2 96% on 2 L nasal cannula which is her baseline.  Sodium 139, potassium 3.7, chloride 100, CO2 29, glucose 120, BUN 20, creatinine 1.02.  WBC 7.0, hemoglobin 12.3, platelets 309.  COVID-19 PCR negative.  Influenza A/B PCR negative.  Urinalysis unrevealing.  EKG with sinus arrhythmia, rate 78, no concerning dynamic changes.  X-ray hip with no acute displaced fracture or dislocation with severe degenerative changes right hip.  Left knee x-ray with advanced osteoarthritic changes with greatest at the lateral compartment with no acute osseous normality.  CT pelvis without contrast with no acute displaced fracture of the bilateral hip/pelvis; but does note degenerative changes of the right hip with diffusely decreased bone density and scattered colonic diverticulosis without diverticulitis.  Left knee CT with severe 3 compartment osteoarthritis greatest in the lateral and patellofemoral compartments with small joint effusion, severe osteopenia and no acute  displaced fracture.  Patient was given 25 mcg of IV fentanyl, 650 mg p.o. Tylenol.  Given that she was unable to ambulate with her walker since the fall EDP consulted [facility] for further evaluation and management.   Assessment & Plan:   Active Problems:   Bilateral hip pain   Bilateral hip pain, left knee pain s/p mechanical fall Ambulatory dysfunction, POA Patient presenting to the ED via EMS after mechanical fall at home without LOC.  Patient is unable to ambulate with her home walker after fall.  Imaging studies with no acute osseous abnormality of the bilateral hips/pelvis or left knee.  Patiently currently resides at [facility] independent living. --PT/OT evaluation: Pending --Tylenol as needed for mild pain --Oxycodone 2.5 mg p.o. every 6 hours prn moderate pain --Fentanyl 12.5 mg IV every 4 hours as needed severe pain  Essential hypertension Chronic systolic and diastolic congestive heart failure, compensated Last TTE in EMR for review 04/21/2018 with LVEF 30%, RV systolic function normal, mild MR, grade 1 diastolic dysfunction.  Follows with cardiology outpatient, Dr. Lady Gary. --Metoprolol succinate 25 mg p.o. daily --Amlodipine 5 mg p.o. daily --Torsemide 20 mg p.o. daily --Strict I's and O's Daily weights  COPD on home oxygen --Continue home Pulmicort nebs twice daily --Albuterol MDI as needed wheezing/shortness of breath --On 2 L nasal cannula which is her baseline with SPO2 100% at rest  Anxiety/depression: Cymbalta 60 mg p.o. daily  Restless leg syndrome --Ropinirole 0.5mg  PO TID   DVT prophylaxis: enoxaparin (LOVENOX) injection 40 mg Start: 05/14/21 2200   Code Status: Full Code Family Communication: No family present at bedside this morning; updated patient's family, [name] via telephone this afternoon  Disposition Plan:  Level of care: Med-Surg Status is: Inpatient  Remains inpatient appropriate because:Ongoing active pain requiring inpatient pain management,  Unsafe d/c plan, IV treatments appropriate due to intensity of illness or inability to take PO, and Inpatient level of care appropriate due to severity of illness  Dispo: The patient is from:  [facility] independent living              Anticipated d/c is to:   [facility] independent living              Patient currently is not medically stable to d/c.   Difficult to place patient No   Consultants:  None  Procedures:  None  Antimicrobials:  None   Subjective: Patient seen examined bedside, resting comfortably.  No specific complaints this morning.  Pain currently controlled.  Has not ambulated since arrival to the hospital.  Awaiting PT/OT evaluation.  No family present at bedside this morning.  No other questions or concerns at this time.  Denies headache, no fever/chills/night sweats, no nausea/vomiting/diarrhea, no chest pain, no palpitations, no shortness of breath, no abdominal pain, no weakness, no fatigue, no paresthesias.  No acute events overnight per nurse staff.  Objective: Vitals:   05/15/21 0507 05/15/21 0737 05/15/21 1100 05/15/21 1121  BP: (!) 174/82 (!) 162/71  126/79  Pulse: 99 67  81  Resp: 20 16  16   Temp: 98 F (36.7 C) 98.2 F (36.8 C)  98 F (36.7 C)  TempSrc:      SpO2: 97% 100% 98% 98%  Weight:      Height:        Intake/Output Summary (Last 24 hours) at 05/15/2021 1516 Last data filed at 05/15/2021 1414 Gross per 24 hour  Intake 583 ml  Output 900 ml  Net -317 ml   Filed Weights   05/14/21 1211  Weight: 76.7 kg    Examination:  General exam: Appears calm and comfortable, elderly in appearance Respiratory system: Clear to auscultation. Respiratory effort normal.  On 2 L nasal cannula which is her baseline Cardiovascular system: S1 & S2 heard, RRR. No JVD, murmurs, rubs, gallops or clicks. No pedal edema. Gastrointestinal system: Abdomen is nondistended, soft and nontender. No organomegaly or masses felt. Normal bowel sounds  heard. Central nervous system: Alert and oriented. No focal neurological deficits. Extremities: Symmetric 5 x 5 power. Skin: No rashes, lesions or ulcers Psychiatry: Judgement and insight appear normal. Mood & affect appropriate.     Data Reviewed: I have personally reviewed following labs and imaging studies  CBC: Recent Labs  Lab 05/14/21 1213  WBC 7.0  HGB 12.3  HCT 39.3  MCV 78.3*  PLT 309   Basic Metabolic Panel: Recent Labs  Lab 05/14/21 1213  NA 139  K 3.7  CL 100  CO2 29  GLUCOSE 120*  BUN 20  CREATININE 1.02*  CALCIUM 8.8*   GFR: Estimated Creatinine Clearance: 39.1 mL/min (A) (by C-G formula based on SCr of 1.02 mg/dL (H)). Liver Function Tests: No results for input(s): AST, ALT, ALKPHOS, BILITOT, PROT, ALBUMIN in the last 168 hours. No results for input(s): LIPASE, AMYLASE in the last 168 hours. No results for input(s): AMMONIA in the last 168 hours. Coagulation Profile: No results for input(s): INR, PROTIME in the last 168 hours. Cardiac Enzymes: No results for input(s): CKTOTAL, CKMB, CKMBINDEX, TROPONINI in the last 168 hours. BNP (last 3 results) No results for input(s): PROBNP in the last 8760 hours. HbA1C: No results for input(s): HGBA1C in  the last 72 hours. CBG: Recent Labs  Lab 05/15/21 0059 05/15/21 0741 05/15/21 1144  GLUCAP 99 93 163*   Lipid Profile: No results for input(s): CHOL, HDL, LDLCALC, TRIG, CHOLHDL, LDLDIRECT in the last 72 hours. Thyroid Function Tests: No results for input(s): TSH, T4TOTAL, FREET4, T3FREE, THYROIDAB in the last 72 hours. Anemia Panel: No results for input(s): VITAMINB12, FOLATE, FERRITIN, TIBC, IRON, RETICCTPCT in the last 72 hours. Sepsis Labs: No results for input(s): PROCALCITON, LATICACIDVEN in the last 168 hours.  Recent Results (from the past 240 hour(s))  Resp Panel by RT-PCR (Flu A&B, Covid) Nasopharyngeal Swab     Status: None   Collection Time: 05/14/21  4:59 PM   Specimen: Nasopharyngeal  Swab; Nasopharyngeal(NP) swabs in vial transport medium  Result Value Ref Range Status   SARS Coronavirus 2 by RT PCR NEGATIVE NEGATIVE Final    Comment: (NOTE) SARS-CoV-2 target nucleic acids are NOT DETECTED.  The SARS-CoV-2 RNA is generally detectable in upper respiratory specimens during the acute phase of infection. The lowest concentration of SARS-CoV-2 viral copies this assay can detect is 138 copies/mL. A negative result does not preclude SARS-Cov-2 infection and should not be used as the sole basis for treatment or other patient management decisions. A negative result may occur with  improper specimen collection/handling, submission of specimen other than nasopharyngeal swab, presence of viral mutation(s) within the areas targeted by this assay, and inadequate number of viral copies(<138 copies/mL). A negative result must be combined with clinical observations, patient history, and epidemiological information. The expected result is Negative.  Fact Sheet for Patients:  BloggerCourse.com  Fact Sheet for Healthcare Providers:  SeriousBroker.it  This test is no t yet approved or cleared by the [place] FDA and  has been authorized for detection and/or diagnosis of SARS-CoV-2 by FDA under an Emergency Use Authorization (EUA). This EUA will remain  in effect (meaning this test can be used) for the duration of the COVID-19 declaration under Section 564(b)(1) of the Act, 21 U.S.C.section 360bbb-3(b)(1), unless the authorization is terminated  or revoked sooner.       Influenza A by PCR NEGATIVE NEGATIVE Final   Influenza B by PCR NEGATIVE NEGATIVE Final    Comment: (NOTE) The Xpert Xpress SARS-CoV-2/FLU/RSV plus assay is intended as an aid in the diagnosis of influenza from Nasopharyngeal swab specimens and should not be used as a sole basis for treatment. Nasal washings and aspirates are unacceptable for Xpert Xpress  SARS-CoV-2/FLU/RSV testing.  Fact Sheet for Patients: BloggerCourse.com  Fact Sheet for Healthcare Providers: SeriousBroker.it  This test is not yet approved or cleared by the [place] FDA and has been authorized for detection and/or diagnosis of SARS-CoV-2 by FDA under an Emergency Use Authorization (EUA). This EUA will remain in effect (meaning this test can be used) for the duration of the COVID-19 declaration under Section 564(b)(1) of the Act, 21 U.S.C. section 360bbb-3(b)(1), unless the authorization is terminated or revoked.  Performed at [facility], [address] [place], [place] [address]          Radiology Studies: DG Knee 1-2 Views Left  Result Date: 05/14/2021 CLINICAL DATA:  Larey Seat in bathroom last night, LEFT knee pain EXAM: LEFT KNEE - 1-2 VIEW COMPARISON:  None FINDINGS: Osseous demineralization. Advanced degenerative changes LEFT knee with joint space narrowing and spur formation, greatest at lateral compartment where bone-on-bone appearance is seen. No acute fracture, dislocation, or bone destruction. No joint effusion. IMPRESSION: Osseous demineralization with advanced osteoarthritic changes  LEFT knee greatest at lateral compartment. No acute osseous abnormalities. Electronically Signed   By: Ulyses Southward M.D.   On: 05/14/2021 13:26   CT PELVIS WO CONTRAST  Result Date: 05/14/2021 CLINICAL DATA:  Pelvic trauma.  Status post fall. EXAM: CT PELVIS WITHOUT CONTRAST TECHNIQUE: Multidetector CT imaging of the pelvis was performed following the standard protocol without intravenous contrast. COMPARISON:  CT abdomen pelvis 09/09/2016 FINDINGS: Urinary Tract:  No abnormality visualized. Bowel: Scattered colonic diverticulosis unremarkable visualized pelvic bowel loops. Vascular/Lymphatic: Atherosclerotic plaque. No pathologically enlarged lymph nodes. No significant vascular abnormality seen. Reproductive:   No mass or other significant abnormality Other: No intraperitoneal free fluid. No intraperitoneal free gas. No organized fluid collection. Musculoskeletal: Diffusely decreased bone density. No suspicious bone lesions identified. No acute displaced fracture. Severe degenerative changes of the right femoroacetabular joint. Mild degenerative changes of the left femoroacetabular joint. Likely old healed fracture of the S2/S1 level. IMPRESSION: 1. No acute displaced fracture of the bilateral hips and pelvis. No pelvic diastasis. 2. Severe degenerative changes of the right hip in a patient with diffusely decreased bone density. 3. Scattered colonic diverticulosis with no acute diverticulitis. Electronically Signed   By: Tish Frederickson M.D.   On: 05/14/2021 17:40   CT Knee Left Wo Contrast  Result Date: 05/14/2021 CLINICAL DATA:  Larey Seat yesterday, left knee pain, inability to straighten left knee EXAM: CT OF THE LEFT KNEE WITHOUT CONTRAST TECHNIQUE: Multidetector CT imaging of the LEFT knee was performed according to the standard protocol. Multiplanar CT image reconstructions were also generated. COMPARISON:  05/14/2021 FINDINGS: Bones/Joint/Cartilage The left knee is held in flexion during imaging. The bones are diffusely osteopenic. There are no acute displaced fractures. There is severe 3 compartmental osteoarthritis with bone on bone contact in the lateral compartment. There is severe joint space narrowing within the lateral compartment of the patellofemoral compartment. There is a trace suprapatellar scratch at there is a small suprapatellar effusion, likely reactive. Ligaments Suboptimally assessed by CT. Muscles and Tendons Diffuse muscular atrophy.  No gross abnormalities. Soft tissues Unremarkable. Reconstructed images demonstrate no additional findings. IMPRESSION: 1. Severe 3 compartmental osteoarthritis greatest in the lateral and patellofemoral compartments as above. 2. No acute displaced fracture. 3.  Small joint effusion. 4. Severe osteopenia. Electronically Signed   By: Sharlet Salina M.D.   On: 05/14/2021 17:39   DG Hip Unilat W or Wo Pelvis 2-3 Views Left  Result Date: 05/14/2021 CLINICAL DATA:  Fall. EXAM: DG HIP (WITH OR WITHOUT PELVIS) 2-3V LEFT; DG HIP (WITH OR WITHOUT PELVIS) 2-3V RIGHT COMPARISON:  CT abdomen pelvis 08/10/2012 FINDINGS: Severe degenerative changes of the right femoroacetabular joint. At least mild to moderate degenerative changes of the left acetabulofemoral joint. There is no evidence of hip fracture or dislocation. No aggressive appearing focal bone abnormality. Atherosclerotic plaque. IMPRESSION: 1. No acute displaced fracture or dislocation of bilateral hips. 2. No acute displaced fracture or diastasis of the bones of the pelvis. 3. Severe degenerative changes of the right hip. Electronically Signed   By: Tish Frederickson M.D.   On: 05/14/2021 15:56   DG Hip Unilat W or Wo Pelvis 2-3 Views Right  Result Date: 05/14/2021 CLINICAL DATA:  Fall. EXAM: DG HIP (WITH OR WITHOUT PELVIS) 2-3V LEFT; DG HIP (WITH OR WITHOUT PELVIS) 2-3V RIGHT COMPARISON:  CT abdomen pelvis 08/10/2012 FINDINGS: Severe degenerative changes of the right femoroacetabular joint. At least mild to moderate degenerative changes of the left acetabulofemoral joint. There is no evidence of hip fracture or  dislocation. No aggressive appearing focal bone abnormality. Atherosclerotic plaque. IMPRESSION: 1. No acute displaced fracture or dislocation of bilateral hips. 2. No acute displaced fracture or diastasis of the bones of the pelvis. 3. Severe degenerative changes of the right hip. Electronically Signed   By: Tish Frederickson M.D.   On: 05/14/2021 15:56        Scheduled Meds:  amLODipine  5 mg Oral QHS   [START ON 06/04/2021] cyanocobalamin  1,000 mcg Intramuscular Q30 days   DULoxetine  60 mg Oral Daily   enoxaparin (LOVENOX) injection  40 mg Subcutaneous QHS   insulin aspart  0-9 Units Subcutaneous  TID AC & HS   metoprolol succinate  25 mg Oral Daily   montelukast  10 mg Oral QHS   pantoprazole  40 mg Oral Daily   rOPINIRole  0.5 mg Oral TID   sodium chloride flush  3 mL Intravenous Q12H   torsemide  20 mg Oral Daily   [START ON 05/17/2021] Vitamin D (Ergocalciferol)  50,000 Units Oral Weekly   Continuous Infusions:  sodium chloride       LOS: 1 day    Time spent: 39 minutes spent on chart review, discussion with nursing staff, consultants, updating family and interview/physical exam; more than 50% of that time was spent in counseling and/or coordination of care.    Alvira Philips [place], DO [facility] Available via Epic secure chat 7am-7pm After these hours, please refer to coverage provider listed on [email or web address] 05/15/2021, 3:16 PM

## 2021-05-16 DIAGNOSIS — M25552 Pain in left hip: Secondary | ICD-10-CM | POA: Diagnosis not present

## 2021-05-16 DIAGNOSIS — M25551 Pain in right hip: Secondary | ICD-10-CM | POA: Diagnosis not present

## 2021-05-16 LAB — GLUCOSE, CAPILLARY
Glucose-Capillary: 100 mg/dL — ABNORMAL HIGH (ref 70–99)
Glucose-Capillary: 145 mg/dL — ABNORMAL HIGH (ref 70–99)

## 2021-05-16 MED ORDER — OXYCODONE HCL 5 MG PO TABS: 2.5000 mg | ORAL_TABLET | Freq: Four times a day (QID) | ORAL | 0 refills | Status: AC | PRN

## 2021-05-16 NOTE — Progress Notes (Signed)
Pt discharged back to [facility].  IV removed without complication.  AVS placed in packet to go with pt.  All belongings at bedside taken with pt.  Pt transported by EMS.

## 2021-05-16 NOTE — Progress Notes (Signed)
Occupational Therapy Treatment Patient Details Name: Brittany Munoz MRN: [id number] DOB: 1927-10-26 Today's Date: 05/16/2021   History of present illness Brittany Munoz is a 87 y.o. Caucasian female with medical history significant for asthma, anxiety, COPD, cardiomyopathy, type 2 diabetes mellitus, hypertension and dyslipidemia, who presented to the emergency room with acute onset of mechanical fall that she had last night while she was in the bathroom. Reporting B Hip pain and L knee pain. Imaging negative.   OT comments  [name] was seen for OT treatment on this date. Upon arrival to room pt reclined in bed, reports plan to d/c home today. Pt greatly improved with bed mobility this date, requires increased time and Vcs for log roll technique and encouragement. SBA + RW for BSC t/f x2 - cues for hand placement and RW technique (pt uses 4WW at baseline). MOD I perihygiene with lateral leans. MOD I don/doff socks and SUPERVISION + RW don mesh underwear - SUP for standing portion. MOD I sitting grooming tasks. Pt making good progress toward goals. Pt continues to benefit from skilled OT services to maximize return to PLOF and minimize risk of future falls, injury, caregiver burden, and readmission. Will continue to follow POC. Discharge recommendation remains appropriate.     Recommendations for follow up therapy are one component of a multi-disciplinary discharge planning process, led by the attending physician.  Recommendations may be updated based on patient status, additional functional criteria and insurance authorization.    Follow Up Recommendations  Home health OT;Supervision - Intermittent    Equipment Recommendations  Other (comment) (2WW)    Recommendations for Other Services      Precautions / Restrictions Precautions Precautions: Fall Restrictions Weight Bearing Restrictions: No       Mobility Bed Mobility Overal bed mobility: Needs Assistance Bed Mobility: Sit to  Supine     Supine to sit: Supervision     General bed mobility comments: increased time    Transfers Overall transfer level: Needs assistance Equipment used: Rolling walker (2 wheeled) Transfers: Sit to/from [employer] Transfers Sit to Stand: Supervision;From elevated surface Stand pivot transfers: Supervision       General transfer comment: SBA standing from elevated bed to simulate home bed height. SBA + VCs + RW standing from [facility].    Balance Overall balance assessment: Needs assistance Sitting-balance support: No upper extremity supported;Feet supported Sitting balance-Leahy Scale: Good     Standing balance support: No upper extremity supported;During functional activity Standing balance-Leahy Scale: Fair                             ADL either performed or assessed with clinical judgement   ADL Overall ADL's : Needs assistance/impaired                                       General ADL Comments: SBA + RW for BSC t/f x2 - cues for hand placement and RW technique (pt uses 4WW at baseline). MOD I perihygiene with lateral leans. MOD I don/doff socks and SUPERVISION + RW don mesh underwear - SUP for standing portion. MOD I sitting grooming tasks.      Cognition Arousal/Alertness: Awake/alert Behavior During Therapy: WFL for tasks assessed/performed Overall Cognitive Status: Within Functional Limits for tasks assessed  General Comments: A&O x4, very pleasant and good safety awareness        Exercises Exercises: Other exercises Other Exercises Other Exercises: Pt educated re: OT role, DME recs, d/c recs, falls prevention, ECS, toileting schedule Other Exercises: LBD, toileitng, grooming, sup>sit, sit<>stand, SPT, sitting/standing balance/tolerance      General Comments WFL on 2L [place]    Pertinent Vitals/ Pain       Pain Assessment: Faces Faces Pain Scale: Hurts a little bit Pain  Location: L knee Pain Descriptors / Indicators: Dull;Discomfort;Grimacing Pain Intervention(s): Limited activity within patient's tolerance;Repositioned   Frequency  Min 2X/week        Progress Toward Goals  OT Goals(current goals can now be found in the care plan section)  Progress towards OT goals: Progressing toward goals  Acute Rehab OT Goals Patient Stated Goal: to return to walking, decrease pain OT Goal Formulation: With patient Time For Goal Achievement: 05/29/21 Potential to Achieve Goals: Good ADL Goals Pt Will Perform Grooming: with modified independence;standing Pt Will Perform Lower Body Dressing: with min assist;with adaptive equipment;sit to/from stand Pt Will Transfer to Toilet: with modified independence;ambulating;bedside commode Pt Will Perform Toileting - Clothing Manipulation and hygiene: with modified independence;sitting/lateral leans  Plan Discharge plan remains appropriate;Frequency remains appropriate    Co-evaluation                 AM-PAC OT "6 Clicks" Daily Activity     Outcome Measure   Help from another person eating meals?: None Help from another person taking care of personal grooming?: None Help from another person toileting, which includes using toliet, bedpan, or urinal?: A Little Help from another person bathing (including washing, rinsing, drying)?: A Little Help from another person to put on and taking off regular upper body clothing?: None Help from another person to put on and taking off regular lower body clothing?: A Little 6 Click Score: 21    End of Session Equipment Utilized During Treatment: Rolling walker;Gait belt  OT Visit Diagnosis: Other abnormalities of gait and mobility (R26.89);Muscle weakness (generalized) (M62.81)   Activity Tolerance Patient tolerated treatment well   Patient Left in chair;with call bell/phone within reach;with chair alarm set   Nurse Communication Mobility status        Time:  1245-8099 OT Time Calculation (min): 38 min  Charges: OT General Charges $OT Visit: 1 Visit OT Treatments $Self Care/Home Management : 38-52 mins  Kathie Dike, M.S. OTR/L  05/16/21, 9:56 AM  ascom [phone]

## 2021-05-16 NOTE — Discharge Summary (Signed)
Physician Discharge Summary  Brittany Munoz KYH:[id number] DOB: 10-11-1927 DOA: 05/14/2021  PCP: Mick Sell, MD  Admit date: 05/14/2021 Discharge date: 05/16/2021  [facility]: [name] Disposition: [name] with home health  Recommendations for Outpatient Follow-up:  Follow up with PCP in 1-2 weeks May end up needing higher level of care at home from independent to assisted living  Home Health: PT/OT/RN/aide/social work Equipment/Devices: 3 and 1 bedside commode  Discharge Condition: Stable CODE STATUS: Full code Diet recommendation: Heart healthy/consistent carbohydrate diet  History of present illness:  Brittany Munoz is a 87 year old female with past medical history significant for asthma/COPD, anxiety, type 2 diabetes mellitus, essential hypertension, dyslipidemia, chronic combined systolic/diastolic congestive heart failure, who presented to [facility] ED on 10/11 with hip pain following mechanical fall at home.  Patient reports she was in the bathroom in which she reports her left knee felt like it was going to give out with associated generalized weakness with fall on her left side.  Patient complaining of bilateral hip pain and left knee pain.  Unable to ambulate due to pain with her walker at home.  Denies syncopal episode or loss of consciousness.  Denies dizziness, no blurred vision, no chest pain/palpitations, no nausea/vomiting/diarrhea or abdominal pain.   In the ED, temperature 98.0 F, HR 99, RR 20, BP 152/97, SPO2 96% on 2 L nasal cannula which is her baseline.  Sodium 139, potassium 3.7, chloride 100, CO2 29, glucose 120, BUN 20, creatinine 1.02.  WBC 7.0, hemoglobin 12.3, platelets 309.  COVID-19 PCR negative.  Influenza A/B PCR negative.  Urinalysis unrevealing.  EKG with sinus arrhythmia, rate 78, no concerning dynamic changes.  X-ray hip with no acute displaced fracture or dislocation with severe degenerative changes  right hip.  Left knee x-ray with advanced osteoarthritic changes with greatest at the lateral compartment with no acute osseous normality.  CT pelvis without contrast with no acute displaced fracture of the bilateral hip/pelvis; but does note degenerative changes of the right hip with diffusely decreased bone density and scattered colonic diverticulosis without diverticulitis.  Left knee CT with severe 3 compartment osteoarthritis greatest in the lateral and patellofemoral compartments with small joint effusion, severe osteopenia and no acute displaced fracture.  Patient was given 25 mcg of IV fentanyl, 650 mg p.o. Tylenol.  Given that she was unable to ambulate with her walker since the fall EDP consulted [facility] for further evaluation and management.  Hospital course:  Bilateral hip pain, left knee pain s/p mechanical fall Ambulatory dysfunction, POA Patient presenting to the ED via EMS after mechanical fall at home without LOC.  Patient is unable to ambulate with her home walker after fall.  Imaging studies with no acute osseous abnormality of the bilateral hips/pelvis or left knee.  Patiently currently resides at [name] independent living.  Patient was evaluated by PT and OT during hospitalization with recommendation of home health.  Continue Tylenol as needed for mild pain and oxycodone 2.5 mg p.o. every 6 hours as needed moderate pain.  Outpatient follow-up with PCP.  May end up needing higher level of care at home with increased services with assisted living facility.   Essential hypertension Chronic systolic and diastolic congestive heart failure, compensated Last TTE in EMR for review 04/21/2018 with LVEF 30%, RV systolic function normal, mild MR, grade 1 diastolic dysfunction.  Follows with cardiology outpatient, Dr. Lady Gary.  Continue metoprolol succinate 25 mg p.o. daily, Amlodipine 5 mg p.o. daily, Torsemide 20  mg p.o. daily   COPD on home oxygen Continue home Pulmicort nebs twice daily,  montelukast, albuterol MDI as needed wheezing/shortness of breath. On 2 L nasal cannula which is her baseline with SPO2 100% at rest   Anxiety/depression: Cymbalta 60 mg p.o. daily   Restless leg syndrome Recently started on ropinirole 0.5mg  PO TID  Discharge Diagnoses:  Active Problems:   Bilateral hip pain    Discharge Instructions  Discharge Instructions     Call MD for:  difficulty breathing, headache or visual disturbances   Complete by: As directed    Call MD for:  extreme fatigue   Complete by: As directed    Call MD for:  persistant dizziness or light-headedness   Complete by: As directed    Call MD for:  persistant nausea and vomiting   Complete by: As directed    Call MD for:  severe uncontrolled pain   Complete by: As directed    Call MD for:  temperature >100.4   Complete by: As directed    Diet - low sodium heart healthy   Complete by: As directed    Increase activity slowly   Complete by: As directed       Allergies as of 05/16/2021       Reactions   Aspirin Anaphylaxis   Cefuroxime Axetil Swelling   Moxifloxacin Hcl In Nacl    Other reaction(s): Unknown   Oxycodone Nausea Only   Sulfamethoxazole-trimethoprim    Other reaction(s): Unknown   Tramadol    Other reaction(s): Unknown        Medication List     TAKE these medications    acetaminophen 650 MG CR tablet Commonly known as: TYLENOL Take 1,300 mg by mouth 2 (two) times daily. What changed: Another medication with the same name was removed. Continue taking this medication, and follow the directions you see here.   amLODipine 5 MG tablet Commonly known as: NORVASC Take 5 mg by mouth daily.   budesonide 0.25 MG/2ML nebulizer solution Commonly known as: PULMICORT Inhale 2 mLs into the lungs 2 (two) times daily as needed for wheezing or shortness of breath.   cyanocobalamin 1000 MCG/ML injection Commonly known as: (VITAMIN B-12) Inject 1,000 mcg into the muscle every 30 (thirty)  days.   dicyclomine 10 MG capsule Commonly known as: BENTYL Take 10 mg by mouth 2 (two) times daily as needed.   DULoxetine 60 MG capsule Commonly known as: CYMBALTA Take 60 mg by mouth daily.   guaiFENesin 600 MG 12 hr tablet Commonly known as: MUCINEX Take 600 mg by mouth 2 times daily at 12 noon and 4 pm.   metoprolol succinate 25 MG 24 hr tablet Commonly known as: TOPROL-XL Take 25 mg by mouth daily.   montelukast 10 MG tablet Commonly known as: SINGULAIR Take 10 mg by mouth daily.   omeprazole 40 MG capsule Commonly known as: PRILOSEC Take 40 mg by mouth daily.   oxyCODONE 5 MG immediate release tablet Commonly known as: Oxy IR/ROXICODONE Take 0.5 tablets (2.5 mg total) by mouth every 6 (six) hours as needed for up to 5 days for moderate pain.   ProAir HFA 108 (90 Base) MCG/ACT inhaler Generic drug: albuterol USE 2 PUFFS EVERY 4 HOURS  AS NEEDED   rOPINIRole 0.5 MG tablet Commonly known as: REQUIP Take 0.5 mg by mouth 3 (three) times daily.   torsemide 20 MG tablet Commonly known as: DEMADEX Take 20 mg by mouth daily.   Vitamin D (Ergocalciferol)  1.25 MG (50000 UNIT) Caps capsule Commonly known as: DRISDOL Take 1 capsule by mouth once a week.               Durable Medical Equipment  (From admission, onward)           Start     Ordered   05/15/21 1134  For home use only DME 3 n 1  Once        05/15/21 1133            Follow-up Information     Mick Sell, MD. Schedule an appointment as soon as possible for a visit in 1 week(s).   Specialty: Infectious Diseases Contact information: [address] [place] [address] [phone]                Allergies  Allergen Reactions   Aspirin Anaphylaxis   Cefuroxime Axetil Swelling   Moxifloxacin Hcl In Nacl     Other reaction(s): Unknown   Oxycodone Nausea Only   Sulfamethoxazole-Trimethoprim     Other reaction(s): Unknown   Tramadol     Other reaction(s):  Unknown    Consultations: None   Procedures/Studies: DG Knee 1-2 Views Left  Result Date: 05/14/2021 CLINICAL DATA:  Larey Seat in bathroom last night, LEFT knee pain EXAM: LEFT KNEE - 1-2 VIEW COMPARISON:  None FINDINGS: Osseous demineralization. Advanced degenerative changes LEFT knee with joint space narrowing and spur formation, greatest at lateral compartment where bone-on-bone appearance is seen. No acute fracture, dislocation, or bone destruction. No joint effusion. IMPRESSION: Osseous demineralization with advanced osteoarthritic changes LEFT knee greatest at lateral compartment. No acute osseous abnormalities. Electronically Signed   By: Ulyses Southward M.D.   On: 05/14/2021 13:26   CT PELVIS WO CONTRAST  Result Date: 05/14/2021 CLINICAL DATA:  Pelvic trauma.  Status post fall. EXAM: CT PELVIS WITHOUT CONTRAST TECHNIQUE: Multidetector CT imaging of the pelvis was performed following the standard protocol without intravenous contrast. COMPARISON:  CT abdomen pelvis 09/09/2016 FINDINGS: Urinary Tract:  No abnormality visualized. Bowel: Scattered colonic diverticulosis unremarkable visualized pelvic bowel loops. Vascular/Lymphatic: Atherosclerotic plaque. No pathologically enlarged lymph nodes. No significant vascular abnormality seen. Reproductive:  No mass or other significant abnormality Other: No intraperitoneal free fluid. No intraperitoneal free gas. No organized fluid collection. Musculoskeletal: Diffusely decreased bone density. No suspicious bone lesions identified. No acute displaced fracture. Severe degenerative changes of the right femoroacetabular joint. Mild degenerative changes of the left femoroacetabular joint. Likely old healed fracture of the S2/S1 level. IMPRESSION: 1. No acute displaced fracture of the bilateral hips and pelvis. No pelvic diastasis. 2. Severe degenerative changes of the right hip in a patient with diffusely decreased bone density. 3. Scattered colonic diverticulosis  with no acute diverticulitis. Electronically Signed   By: Tish Frederickson M.D.   On: 05/14/2021 17:40   CT Knee Left Wo Contrast  Result Date: 05/14/2021 CLINICAL DATA:  Larey Seat yesterday, left knee pain, inability to straighten left knee EXAM: CT OF THE LEFT KNEE WITHOUT CONTRAST TECHNIQUE: Multidetector CT imaging of the LEFT knee was performed according to the standard protocol. Multiplanar CT image reconstructions were also generated. COMPARISON:  05/14/2021 FINDINGS: Bones/Joint/Cartilage The left knee is held in flexion during imaging. The bones are diffusely osteopenic. There are no acute displaced fractures. There is severe 3 compartmental osteoarthritis with bone on bone contact in the lateral compartment. There is severe joint space narrowing within the lateral compartment of the patellofemoral compartment. There is a trace suprapatellar scratch at  there is a small suprapatellar effusion, likely reactive. Ligaments Suboptimally assessed by CT. Muscles and Tendons Diffuse muscular atrophy.  No gross abnormalities. Soft tissues Unremarkable. Reconstructed images demonstrate no additional findings. IMPRESSION: 1. Severe 3 compartmental osteoarthritis greatest in the lateral and patellofemoral compartments as above. 2. No acute displaced fracture. 3. Small joint effusion. 4. Severe osteopenia. Electronically Signed   By: Sharlet Salina M.D.   On: 05/14/2021 17:39   DG Hip Unilat W or Wo Pelvis 2-3 Views Left  Result Date: 05/14/2021 CLINICAL DATA:  Fall. EXAM: DG HIP (WITH OR WITHOUT PELVIS) 2-3V LEFT; DG HIP (WITH OR WITHOUT PELVIS) 2-3V RIGHT COMPARISON:  CT abdomen pelvis 08/10/2012 FINDINGS: Severe degenerative changes of the right femoroacetabular joint. At least mild to moderate degenerative changes of the left acetabulofemoral joint. There is no evidence of hip fracture or dislocation. No aggressive appearing focal bone abnormality. Atherosclerotic plaque. IMPRESSION: 1. No acute displaced  fracture or dislocation of bilateral hips. 2. No acute displaced fracture or diastasis of the bones of the pelvis. 3. Severe degenerative changes of the right hip. Electronically Signed   By: Tish Frederickson M.D.   On: 05/14/2021 15:56   DG Hip Unilat W or Wo Pelvis 2-3 Views Right  Result Date: 05/14/2021 CLINICAL DATA:  Fall. EXAM: DG HIP (WITH OR WITHOUT PELVIS) 2-3V LEFT; DG HIP (WITH OR WITHOUT PELVIS) 2-3V RIGHT COMPARISON:  CT abdomen pelvis 08/10/2012 FINDINGS: Severe degenerative changes of the right femoroacetabular joint. At least mild to moderate degenerative changes of the left acetabulofemoral joint. There is no evidence of hip fracture or dislocation. No aggressive appearing focal bone abnormality. Atherosclerotic plaque. IMPRESSION: 1. No acute displaced fracture or dislocation of bilateral hips. 2. No acute displaced fracture or diastasis of the bones of the pelvis. 3. Severe degenerative changes of the right hip. Electronically Signed   By: Tish Frederickson M.D.   On: 05/14/2021 15:56     Subjective: Patient seen examined bedside, resting comfortably.  No specific complaints this morning.  Slightly worried about returning home.  Discussed we will set up home health services to include PT, RN, aide, OT, and social work.  Did discuss may end up needing to increase her level of care to assisted living at some point.  No other questions or concerns at this time.  Denies headache, no chest pain, no shortness of breath, no abdominal pain, no fever/chills/night sweats, no nausea/vomiting/diarrhea.  No acute events overnight per nursing staff.  Discharge Exam: Vitals:   05/16/21 0455 05/16/21 0828  BP: (!) 140/53 122/67  Pulse: 76 (!) 101  Resp: 18 16  Temp: 98 F (36.7 C) 97.8 F (36.6 C)  SpO2: 97% 97%   Vitals:   05/15/21 2326 05/16/21 0455 05/16/21 0500 05/16/21 0828  BP: (!) 142/67 (!) 140/53  122/67  Pulse: 75 76  (!) 101  Resp: Temp: 98 F (36.7 C) 98 F (36.7  C)  97.8 F (36.6 C)  TempSrc:      SpO2: 94% 97%  97%  Weight:   72.6 kg   Height:        General: Pt is alert, awake, not in acute distress, elderly in appearance Cardiovascular: RRR, S1/S2 +, no rubs, no gallops Respiratory: CTA bilaterally, no wheezing, no rhonchi, on 2 L nasal cannula which is her baseline Abdominal: Soft, NT, ND, bowel sounds + Extremities: no edema, no cyanosis    The results of significant diagnostics from this hospitalization (including imaging,  microbiology, ancillary and laboratory) are listed below for reference.     Microbiology: Recent Results (from the past 240 hour(s))  Resp Panel by RT-PCR (Flu A&B, Covid) Nasopharyngeal Swab     Status: None   Collection Time: 05/14/21  4:59 PM   Specimen: Nasopharyngeal Swab; Nasopharyngeal(NP) swabs in vial transport medium  Result Value Ref Range Status   SARS Coronavirus 2 by RT PCR NEGATIVE NEGATIVE Final    Comment: (NOTE) SARS-CoV-2 target nucleic acids are NOT DETECTED.  The SARS-CoV-2 RNA is generally detectable in upper respiratory specimens during the acute phase of infection. The lowest concentration of SARS-CoV-2 viral copies this assay can detect is 138 copies/mL. A negative result does not preclude SARS-Cov-2 infection and should not be used as the sole basis for treatment or other patient management decisions. A negative result may occur with  improper specimen collection/handling, submission of specimen other than nasopharyngeal swab, presence of viral mutation(s) within the areas targeted by this assay, and inadequate number of viral copies(<138 copies/mL). A negative result must be combined with clinical observations, patient history, and epidemiological information. The expected result is Negative.  Fact Sheet for Patients:  BloggerCourse.com  Fact Sheet for Healthcare Providers:  SeriousBroker.it  This test is no t yet approved or  cleared by the [place] FDA and  has been authorized for detection and/or diagnosis of SARS-CoV-2 by FDA under an Emergency Use Authorization (EUA). This EUA will remain  in effect (meaning this test can be used) for the duration of the COVID-19 declaration under Section 564(b)(1) of the Act, 21 U.S.C.section 360bbb-3(b)(1), unless the authorization is terminated  or revoked sooner.       Influenza A by PCR NEGATIVE NEGATIVE Final   Influenza B by PCR NEGATIVE NEGATIVE Final    Comment: (NOTE) The Xpert Xpress SARS-CoV-2/FLU/RSV plus assay is intended as an aid in the diagnosis of influenza from Nasopharyngeal swab specimens and should not be used as a sole basis for treatment. Nasal washings and aspirates are unacceptable for Xpert Xpress SARS-CoV-2/FLU/RSV testing.  Fact Sheet for Patients: BloggerCourse.com  Fact Sheet for Healthcare Providers: SeriousBroker.it  This test is not yet approved or cleared by the [place] FDA and has been authorized for detection and/or diagnosis of SARS-CoV-2 by FDA under an Emergency Use Authorization (EUA). This EUA will remain in effect (meaning this test can be used) for the duration of the COVID-19 declaration under Section 564(b)(1) of the Act, 21 U.S.C. section 360bbb-3(b)(1), unless the authorization is terminated or revoked.  Performed at [facility], [address] [place], [place] [address]      Labs: BNP (last 3 results) No results for input(s): BNP in the last 8760 hours. Basic Metabolic Panel: Recent Labs  Lab 05/14/21 1213  NA 139  K 3.7  CL 100  CO2 29  GLUCOSE 120*  BUN 20  CREATININE 1.02*  CALCIUM 8.8*   Liver Function Tests: No results for input(s): AST, ALT, ALKPHOS, BILITOT, PROT, ALBUMIN in the last 168 hours. No results for input(s): LIPASE, AMYLASE in the last 168 hours. No results for input(s): AMMONIA in the last 168  hours. CBC: Recent Labs  Lab 05/14/21 1213  WBC 7.0  HGB 12.3  HCT 39.3  MCV 78.3*  PLT 309   Cardiac Enzymes: No results for input(s): CKTOTAL, CKMB, CKMBINDEX, TROPONINI in the last 168 hours. BNP: Invalid input(s): POCBNP CBG: Recent Labs  Lab 05/15/21 0741 05/15/21 1144 05/15/21 1622 05/15/21 2053 05/16/21 0829  GLUCAP  93 163* 96 120* 100*   D-Dimer No results for input(s): DDIMER in the last 72 hours. Hgb A1c Recent Labs    05/14/21 1213  HGBA1C 6.0*   Lipid Profile No results for input(s): CHOL, HDL, LDLCALC, TRIG, CHOLHDL, LDLDIRECT in the last 72 hours. Thyroid function studies No results for input(s): TSH, T4TOTAL, T3FREE, THYROIDAB in the last 72 hours.  Invalid input(s): FREET3 Anemia work up No results for input(s): VITAMINB12, FOLATE, FERRITIN, TIBC, IRON, RETICCTPCT in the last 72 hours. Urinalysis    Component Value Date/Time   COLORURINE YELLOW (A) 05/14/2021 2037   APPEARANCEUR CLEAR (A) 05/14/2021 2037   APPEARANCEUR Clear 08/10/2012 2024   LABSPEC 1.016 05/14/2021 2037   LABSPEC 1.002 08/10/2012 2024   PHURINE 6.0 05/14/2021 2037   GLUCOSEU NEGATIVE 05/14/2021 2037   GLUCOSEU Negative 08/10/2012 2024   HGBUR SMALL (A) 05/14/2021 2037   BILIRUBINUR NEGATIVE 05/14/2021 2037   BILIRUBINUR Negative 08/10/2012 2024   KETONESUR 5 (A) 05/14/2021 2037   PROTEINUR NEGATIVE 05/14/2021 2037   NITRITE NEGATIVE 05/14/2021 2037   LEUKOCYTESUR NEGATIVE 05/14/2021 2037   LEUKOCYTESUR Trace 08/10/2012 2024   Sepsis Labs Invalid input(s): PROCALCITONIN,  WBC,  LACTICIDVEN Microbiology Recent Results (from the past 240 hour(s))  Resp Panel by RT-PCR (Flu A&B, Covid) Nasopharyngeal Swab     Status: None   Collection Time: 05/14/21  4:59 PM   Specimen: Nasopharyngeal Swab; Nasopharyngeal(NP) swabs in vial transport medium  Result Value Ref Range Status   SARS Coronavirus 2 by RT PCR NEGATIVE NEGATIVE Final    Comment: (NOTE) SARS-CoV-2 target  nucleic acids are NOT DETECTED.  The SARS-CoV-2 RNA is generally detectable in upper respiratory specimens during the acute phase of infection. The lowest concentration of SARS-CoV-2 viral copies this assay can detect is 138 copies/mL. A negative result does not preclude SARS-Cov-2 infection and should not be used as the sole basis for treatment or other patient management decisions. A negative result may occur with  improper specimen collection/handling, submission of specimen other than nasopharyngeal swab, presence of viral mutation(s) within the areas targeted by this assay, and inadequate number of viral copies(<138 copies/mL). A negative result must be combined with clinical observations, patient history, and epidemiological information. The expected result is Negative.  Fact Sheet for Patients:  BloggerCourse.com  Fact Sheet for Healthcare Providers:  SeriousBroker.it  This test is no t yet approved or cleared by the [place] FDA and  has been authorized for detection and/or diagnosis of SARS-CoV-2 by FDA under an Emergency Use Authorization (EUA). This EUA will remain  in effect (meaning this test can be used) for the duration of the COVID-19 declaration under Section 564(b)(1) of the Act, 21 U.S.C.section 360bbb-3(b)(1), unless the authorization is terminated  or revoked sooner.       Influenza A by PCR NEGATIVE NEGATIVE Final   Influenza B by PCR NEGATIVE NEGATIVE Final    Comment: (NOTE) The Xpert Xpress SARS-CoV-2/FLU/RSV plus assay is intended as an aid in the diagnosis of influenza from Nasopharyngeal swab specimens and should not be used as a sole basis for treatment. Nasal washings and aspirates are unacceptable for Xpert Xpress SARS-CoV-2/FLU/RSV testing.  Fact Sheet for Patients: BloggerCourse.com  Fact Sheet for Healthcare  Providers: SeriousBroker.it  This test is not yet approved or cleared by the [place] FDA and has been authorized for detection and/or diagnosis of SARS-CoV-2 by FDA under an Emergency Use Authorization (EUA). This EUA will remain in effect (meaning this test can be  used) for the duration of the COVID-19 declaration under Section 564(b)(1) of the Act, 21 U.S.C. section 360bbb-3(b)(1), unless the authorization is terminated or revoked.  Performed at [facility], [address], [place], [place] [address]      Time coordinating discharge: Over 30 minutes  SIGNED:   Alvira Philips [place], DO  [facility] 05/16/2021, 9:39 AM

## 2021-05-16 NOTE — TOC Progression Note (Addendum)
Transition of Care [facility]) - Progression Note    Patient Details  Name: Brittany Munoz MRN: [id number] Date of Birth: 1928/01/17  Transition of Care [facility]) CM/SW Contact  Marlowe Sax, RN Phone Number: 05/16/2021, 11:04 AM  Clinical Narrative:   Received a call from [facility] at [facility], she tells me that She has spoken with [name] and let her know that the patient is being transported with EMS today back to her apartment at 1 PM, she stated that [name] continues to say that she thinks the patient needs to go to SNF, She stated that she reiterated that the patient will be returning to her apartment  today, I explained that the patient has had OT in with her this morning and the plan remains the same to go back to ILF with [facility] for PT and OT, Rene Kocher agrees and stated that they will keep an eye on the patient and will work with her for [facility] services, I explained that the patient will get a new RW and that if it was not available to come back with her then it would be delivered to her apartment drop shipped I called [name]  the patient's ex daughter in law and explained to her the patient will return today to [facility], she stated that it was a year ago that her mom died and she is anxious about the patient going home, I explained that if she wants the patient to go to a higher level of care that the patient will need a payer source, she stated that she does not have that kind of money, I explained that Medicare does not cover long term care, she became hysterically crying and stated that she is afraid the patient will go home and die because her mom died, I provided sympathy and support, once the Daughter in law was able to calm down  I explained that the patient is doing well, she has worked with Therapy and is doing very well, I thanked her for the help that she provides for the patient and explained that it is difficult to lose a parent however her mom's situation and the patient's situation are not the  same, she agreed, I encouraged her to make sure to take time for herself so that she can have the ability to help other the way she wants, , She stated that she went to the patient's apartment and cleaned it spotless and moved things around to make more room, she stated that the patient doe snot appreciate anything she does for her but yet she keeps doing it.  I expressed thanks for her help that she provides for the patient. She stated that right now is just a really bad time for her personally and thanked me for me help and stated that she needed to get off the phone,         Expected Discharge Plan and Services           Expected Discharge Date: 05/16/21                                     Social Determinants of Health (SDOH) Interventions    Readmission Risk Interventions No flowsheet data found.

## 2021-05-16 NOTE — TOC Progression Note (Addendum)
Transition of Care [facility]) - Progression Note    Patient Details  Name: Brittany Munoz MRN: [id number] Date of Birth: March 07, 1928  Transition of Care [facility]) CM/SW Contact  Marlowe Sax, RN Phone Number: 05/16/2021, 9:18 AM  Clinical Narrative:   Spoke with Rene Kocher at [facility], she stated that the patient will need EMS transport,  and pick up around 1 PM, Regina at [facility] spoke with the Daughter in law [name] to make sure everyone is on the same page that the patient will return to [facility] today,She confirmed that they did receive the [facility] orders and everything is set up, they will have some one there to assist the patient back into her apartment, she will have a rolling walker delivered by [employer] to go back with her         Expected Discharge Plan and Services                                                 Social Determinants of Health (SDOH) Interventions    Readmission Risk Interventions No flowsheet data found.

## 2021-06-03 ENCOUNTER — Emergency Department: Payer: Medicare Other

## 2021-06-03 ENCOUNTER — Other Ambulatory Visit: Payer: Self-pay

## 2021-06-03 DIAGNOSIS — Z825 Family history of asthma and other chronic lower respiratory diseases: Secondary | ICD-10-CM

## 2021-06-03 DIAGNOSIS — Z79899 Other long term (current) drug therapy: Secondary | ICD-10-CM

## 2021-06-03 DIAGNOSIS — E785 Hyperlipidemia, unspecified: Secondary | ICD-10-CM | POA: Diagnosis present

## 2021-06-03 DIAGNOSIS — J449 Chronic obstructive pulmonary disease, unspecified: Secondary | ICD-10-CM | POA: Diagnosis present

## 2021-06-03 DIAGNOSIS — J8410 Pulmonary fibrosis, unspecified: Secondary | ICD-10-CM | POA: Diagnosis present

## 2021-06-03 DIAGNOSIS — Z20822 Contact with and (suspected) exposure to covid-19: Secondary | ICD-10-CM | POA: Diagnosis present

## 2021-06-03 DIAGNOSIS — Z87891 Personal history of nicotine dependence: Secondary | ICD-10-CM

## 2021-06-03 DIAGNOSIS — S72002A Fracture of unspecified part of neck of left femur, initial encounter for closed fracture: Secondary | ICD-10-CM | POA: Diagnosis not present

## 2021-06-03 DIAGNOSIS — E1122 Type 2 diabetes mellitus with diabetic chronic kidney disease: Secondary | ICD-10-CM | POA: Diagnosis present

## 2021-06-03 DIAGNOSIS — F0394 Unspecified dementia, unspecified severity, with anxiety: Secondary | ICD-10-CM | POA: Diagnosis present

## 2021-06-03 DIAGNOSIS — E875 Hyperkalemia: Secondary | ICD-10-CM | POA: Diagnosis not present

## 2021-06-03 DIAGNOSIS — Z9049 Acquired absence of other specified parts of digestive tract: Secondary | ICD-10-CM

## 2021-06-03 DIAGNOSIS — T40605A Adverse effect of unspecified narcotics, initial encounter: Secondary | ICD-10-CM | POA: Diagnosis not present

## 2021-06-03 DIAGNOSIS — M810 Age-related osteoporosis without current pathological fracture: Secondary | ICD-10-CM | POA: Diagnosis present

## 2021-06-03 DIAGNOSIS — I129 Hypertensive chronic kidney disease with stage 1 through stage 4 chronic kidney disease, or unspecified chronic kidney disease: Secondary | ICD-10-CM | POA: Diagnosis present

## 2021-06-03 DIAGNOSIS — F32A Depression, unspecified: Secondary | ICD-10-CM | POA: Diagnosis present

## 2021-06-03 DIAGNOSIS — Z8261 Family history of arthritis: Secondary | ICD-10-CM

## 2021-06-03 DIAGNOSIS — Z9981 Dependence on supplemental oxygen: Secondary | ICD-10-CM

## 2021-06-03 DIAGNOSIS — E860 Dehydration: Secondary | ICD-10-CM | POA: Diagnosis present

## 2021-06-03 DIAGNOSIS — F0393 Unspecified dementia, unspecified severity, with mood disturbance: Secondary | ICD-10-CM | POA: Diagnosis present

## 2021-06-03 DIAGNOSIS — Z881 Allergy status to other antibiotic agents status: Secondary | ICD-10-CM

## 2021-06-03 DIAGNOSIS — J9611 Chronic respiratory failure with hypoxia: Secondary | ICD-10-CM | POA: Diagnosis present

## 2021-06-03 DIAGNOSIS — N1831 Chronic kidney disease, stage 3a: Secondary | ICD-10-CM | POA: Diagnosis present

## 2021-06-03 DIAGNOSIS — E876 Hypokalemia: Secondary | ICD-10-CM | POA: Diagnosis not present

## 2021-06-03 DIAGNOSIS — D72829 Elevated white blood cell count, unspecified: Secondary | ICD-10-CM | POA: Diagnosis not present

## 2021-06-03 DIAGNOSIS — W19XXXA Unspecified fall, initial encounter: Secondary | ICD-10-CM | POA: Diagnosis present

## 2021-06-03 DIAGNOSIS — Z8249 Family history of ischemic heart disease and other diseases of the circulatory system: Secondary | ICD-10-CM

## 2021-06-03 DIAGNOSIS — Y92239 Unspecified place in hospital as the place of occurrence of the external cause: Secondary | ICD-10-CM | POA: Diagnosis not present

## 2021-06-03 DIAGNOSIS — Z885 Allergy status to narcotic agent status: Secondary | ICD-10-CM

## 2021-06-03 DIAGNOSIS — D62 Acute posthemorrhagic anemia: Secondary | ICD-10-CM | POA: Diagnosis not present

## 2021-06-03 DIAGNOSIS — Z800 Family history of malignant neoplasm of digestive organs: Secondary | ICD-10-CM

## 2021-06-03 DIAGNOSIS — R000 Tachycardia, unspecified: Secondary | ICD-10-CM | POA: Diagnosis present

## 2021-06-03 DIAGNOSIS — G9341 Metabolic encephalopathy: Secondary | ICD-10-CM | POA: Diagnosis not present

## 2021-06-03 DIAGNOSIS — Z7951 Long term (current) use of inhaled steroids: Secondary | ICD-10-CM

## 2021-06-03 DIAGNOSIS — Z85828 Personal history of other malignant neoplasm of skin: Secondary | ICD-10-CM

## 2021-06-03 DIAGNOSIS — Z886 Allergy status to analgesic agent status: Secondary | ICD-10-CM

## 2021-06-03 MED ORDER — ACETAMINOPHEN 325 MG PO TABS: 650.0000 mg | ORAL_TABLET | Freq: Once | ORAL | Status: AC

## 2021-06-03 MED ORDER — ACETAMINOPHEN 325 MG PO TABS
ORAL_TABLET | ORAL | Status: AC
  Administered 2021-06-03: 650 mg via ORAL
  Filled 2021-06-03: qty 2

## 2021-06-03 NOTE — ED Triage Notes (Addendum)
Pt was seen here on the 11th of oct for a fall and left knee pain, pt reports that she is having left leg pain that radiates from the left knee up into her left hip, pt is able to stand per ems and put pressure on the leg but is unable to walk without assistance  Pt is from [facility]

## 2021-06-03 NOTE — ED Provider Notes (Signed)
Emergency Medicine Provider Triage Evaluation Note  Brittany Munoz , a 87 y.o. female  was evaluated in triage.  Pt complains of left hip pain.  Patient sustained a fall roughly 2 weeks ago.  Patient states that she injured her left hip during the fall.  She had negative imaging initially this had pain ongoing and has been unable to ambulate.  She states that before she was able to ambulate with a walker, she is having difficulty ambulating at all at this time.  She cannot stand but cannot walk.  No other injuries or complaints..  Review of Systems  Positive: Left hip pain, decreased ambulation after fall 2 weeks ago Negative: Patient did not hit her head, has had no headache, neck pain, abdominal complaints.  Physical Exam  Pulse 89   Temp 98.9 F (37.2 C) (Oral)   Resp 16   Ht 5\' 8"  (1.727 m)   Wt 74.8 kg   SpO2 96%   BMI 25.09 kg/m  Gen:   Awake, no distress   Resp:  Normal effort  MSK:   Moves extremities without difficulty patient is mildly diffusely tender to palpation over the left femur region.  No palpable abnormality.  Pulses sensation intact distally. Other:    Medical Decision Making  Medically screening exam initiated at 3:06 PM.  Appropriate orders placed.  MANJU KULKARNI was informed that the remainder of the evaluation will be completed by another provider, this initial triage assessment does not replace that evaluation, and the importance of remaining in the ED until their evaluation is complete.  Patient with mechanical fall 2 weeks ago, ongoing left hip, femur pain.  We will image the lumbar spine, left hip, left knee at this time.   [name], PA-C 06/03/21 1506    06/05/21, MD 06/04/21 [phone]

## 2021-06-04 ENCOUNTER — Emergency Department: Payer: Medicare Other

## 2021-06-04 ENCOUNTER — Inpatient Hospital Stay: Payer: Medicare Other | Admitting: Certified Registered Nurse Anesthetist

## 2021-06-04 ENCOUNTER — Encounter
Admission: EM | Disposition: A | Discharge status: Skilled Nursing Facility | Payer: Self-pay | Source: Home / Self Care | Attending: Internal Medicine

## 2021-06-04 ENCOUNTER — Inpatient Hospital Stay
Admission: EM | Admit: 2021-06-04 | Discharge: 2021-06-07 | DRG: 521 | Disposition: A | Discharge status: Skilled Nursing Facility | Payer: Medicare Other | Attending: Internal Medicine | Admitting: Internal Medicine

## 2021-06-04 ENCOUNTER — Encounter: Payer: Self-pay | Admitting: Internal Medicine

## 2021-06-04 ENCOUNTER — Inpatient Hospital Stay: Payer: Medicare Other

## 2021-06-04 DIAGNOSIS — R000 Tachycardia, unspecified: Secondary | ICD-10-CM | POA: Diagnosis not present

## 2021-06-04 DIAGNOSIS — Z800 Family history of malignant neoplasm of digestive organs: Secondary | ICD-10-CM | POA: Diagnosis not present

## 2021-06-04 DIAGNOSIS — S72002A Fracture of unspecified part of neck of left femur, initial encounter for closed fracture: Secondary | ICD-10-CM | POA: Diagnosis not present

## 2021-06-04 DIAGNOSIS — G9341 Metabolic encephalopathy: Secondary | ICD-10-CM | POA: Diagnosis not present

## 2021-06-04 DIAGNOSIS — I10 Essential (primary) hypertension: Secondary | ICD-10-CM

## 2021-06-04 DIAGNOSIS — I129 Hypertensive chronic kidney disease with stage 1 through stage 4 chronic kidney disease, or unspecified chronic kidney disease: Secondary | ICD-10-CM | POA: Diagnosis present

## 2021-06-04 DIAGNOSIS — J8410 Pulmonary fibrosis, unspecified: Secondary | ICD-10-CM

## 2021-06-04 DIAGNOSIS — J449 Chronic obstructive pulmonary disease, unspecified: Secondary | ICD-10-CM | POA: Diagnosis not present

## 2021-06-04 DIAGNOSIS — F0393 Unspecified dementia, unspecified severity, with mood disturbance: Secondary | ICD-10-CM | POA: Diagnosis present

## 2021-06-04 DIAGNOSIS — W19XXXA Unspecified fall, initial encounter: Secondary | ICD-10-CM | POA: Diagnosis present

## 2021-06-04 DIAGNOSIS — F32A Depression, unspecified: Secondary | ICD-10-CM | POA: Diagnosis present

## 2021-06-04 DIAGNOSIS — Z825 Family history of asthma and other chronic lower respiratory diseases: Secondary | ICD-10-CM | POA: Diagnosis not present

## 2021-06-04 DIAGNOSIS — F0394 Unspecified dementia, unspecified severity, with anxiety: Secondary | ICD-10-CM | POA: Diagnosis present

## 2021-06-04 DIAGNOSIS — J9611 Chronic respiratory failure with hypoxia: Secondary | ICD-10-CM | POA: Insufficient documentation

## 2021-06-04 DIAGNOSIS — Z8249 Family history of ischemic heart disease and other diseases of the circulatory system: Secondary | ICD-10-CM | POA: Diagnosis not present

## 2021-06-04 DIAGNOSIS — E119 Type 2 diabetes mellitus without complications: Secondary | ICD-10-CM

## 2021-06-04 DIAGNOSIS — N1831 Chronic kidney disease, stage 3a: Secondary | ICD-10-CM

## 2021-06-04 DIAGNOSIS — Z87891 Personal history of nicotine dependence: Secondary | ICD-10-CM | POA: Diagnosis not present

## 2021-06-04 DIAGNOSIS — Z96649 Presence of unspecified artificial hip joint: Secondary | ICD-10-CM

## 2021-06-04 DIAGNOSIS — D72829 Elevated white blood cell count, unspecified: Secondary | ICD-10-CM | POA: Diagnosis not present

## 2021-06-04 DIAGNOSIS — Z8261 Family history of arthritis: Secondary | ICD-10-CM | POA: Diagnosis not present

## 2021-06-04 DIAGNOSIS — Y92239 Unspecified place in hospital as the place of occurrence of the external cause: Secondary | ICD-10-CM | POA: Diagnosis not present

## 2021-06-04 DIAGNOSIS — Z9981 Dependence on supplemental oxygen: Secondary | ICD-10-CM | POA: Diagnosis not present

## 2021-06-04 DIAGNOSIS — E860 Dehydration: Secondary | ICD-10-CM | POA: Diagnosis present

## 2021-06-04 DIAGNOSIS — D62 Acute posthemorrhagic anemia: Secondary | ICD-10-CM | POA: Diagnosis not present

## 2021-06-04 DIAGNOSIS — Z9049 Acquired absence of other specified parts of digestive tract: Secondary | ICD-10-CM | POA: Diagnosis not present

## 2021-06-04 DIAGNOSIS — Z85828 Personal history of other malignant neoplasm of skin: Secondary | ICD-10-CM | POA: Diagnosis not present

## 2021-06-04 DIAGNOSIS — Z20822 Contact with and (suspected) exposure to covid-19: Secondary | ICD-10-CM | POA: Diagnosis present

## 2021-06-04 DIAGNOSIS — Z886 Allergy status to analgesic agent status: Secondary | ICD-10-CM | POA: Diagnosis not present

## 2021-06-04 DIAGNOSIS — E876 Hypokalemia: Secondary | ICD-10-CM | POA: Diagnosis not present

## 2021-06-04 DIAGNOSIS — E1122 Type 2 diabetes mellitus with diabetic chronic kidney disease: Secondary | ICD-10-CM | POA: Diagnosis present

## 2021-06-04 DIAGNOSIS — E875 Hyperkalemia: Secondary | ICD-10-CM | POA: Diagnosis not present

## 2021-06-04 HISTORY — PX: HIP ARTHROPLASTY: SHX981

## 2021-06-04 LAB — COMPREHENSIVE METABOLIC PANEL
ALT: 11 U/L (ref 0–44)
AST: 18 U/L (ref 15–41)
Albumin: 3.9 g/dL (ref 3.5–5.0)
Alkaline Phosphatase: 76 U/L (ref 38–126)
Anion gap: 12 (ref 5–15)
BUN: 27 mg/dL — ABNORMAL HIGH (ref 8–23)
CO2: 28 mmol/L (ref 22–32)
Calcium: 9.5 mg/dL (ref 8.9–10.3)
Chloride: 98 mmol/L (ref 98–111)
Creatinine, Ser: 1.21 mg/dL — ABNORMAL HIGH (ref 0.44–1.00)
GFR, Estimated: 42 mL/min — ABNORMAL LOW (ref >60)
Glucose, Bld: 128 mg/dL — ABNORMAL HIGH (ref 70–99)
Potassium: 3.4 mmol/L — ABNORMAL LOW (ref 3.5–5.1)
Sodium: 138 mmol/L (ref 135–145)
Total Bilirubin: 0.9 mg/dL (ref 0.3–1.2)
Total Protein: 7.7 g/dL (ref 6.5–8.1)

## 2021-06-04 LAB — CBC
HCT: 40.1 % (ref 36.0–46.0)
Hemoglobin: 12.2 g/dL (ref 12.0–15.0)
MCH: 23.9 pg — ABNORMAL LOW (ref 26.0–34.0)
MCHC: 30.4 g/dL (ref 30.0–36.0)
MCV: 78.5 fL — ABNORMAL LOW (ref 80.0–100.0)
Platelets: 397 10*3/uL (ref 150–400)
RBC: 5.11 MIL/uL (ref 3.87–5.11)
RDW: 15.2 % (ref 11.5–15.5)
WBC: 10.2 10*3/uL (ref 4.0–10.5)
nRBC: 0 % (ref 0.0–0.2)

## 2021-06-04 LAB — TYPE AND SCREEN
ABO/RH(D): O POS
Antibody Screen: NEGATIVE

## 2021-06-04 LAB — CBG MONITORING, ED: Glucose-Capillary: 112 mg/dL — ABNORMAL HIGH (ref 70–99)

## 2021-06-04 LAB — RESP PANEL BY RT-PCR (FLU A&B, COVID) ARPGX2
Influenza A by PCR: NEGATIVE
Influenza B by PCR: NEGATIVE
SARS Coronavirus 2 by RT PCR: NEGATIVE

## 2021-06-04 LAB — GLUCOSE, CAPILLARY: Glucose-Capillary: 128 mg/dL — ABNORMAL HIGH (ref 70–99)

## 2021-06-04 LAB — SURGICAL PCR SCREEN
MRSA, PCR: NEGATIVE
Staphylococcus aureus: NEGATIVE

## 2021-06-04 LAB — ABO/RH: ABO/RH(D): O POS

## 2021-06-04 SURGERY — HEMIARTHROPLASTY, HIP, DIRECT ANTERIOR APPROACH, FOR FRACTURE
Anesthesia: Spinal | Site: Hip | Laterality: Left

## 2021-06-04 MED ORDER — SODIUM CHLORIDE FLUSH 0.9 % IV SOLN
INTRAVENOUS | Status: AC
  Filled 2021-06-04: qty 40

## 2021-06-04 MED ORDER — TORSEMIDE 20 MG PO TABS
20.0000 mg | ORAL_TABLET | Freq: Every day | ORAL | Status: DC
  Administered 2021-06-05 – 2021-06-07 (×3): 20 mg via ORAL
  Filled 2021-06-04 (×3): qty 1

## 2021-06-04 MED ORDER — CLINDAMYCIN PHOSPHATE 900 MG/50ML IV SOLN
INTRAVENOUS | Status: AC
  Filled 2021-06-04: qty 50

## 2021-06-04 MED ORDER — PANTOPRAZOLE SODIUM 40 MG PO TBEC
40.0000 mg | DELAYED_RELEASE_TABLET | Freq: Every day | ORAL | Status: DC
  Administered 2021-06-05 – 2021-06-07 (×3): 40 mg via ORAL
  Filled 2021-06-04 (×3): qty 1

## 2021-06-04 MED ORDER — TRANEXAMIC ACID 1000 MG/10ML IV SOLN
INTRAVENOUS | Status: AC
  Filled 2021-06-04: qty 10

## 2021-06-04 MED ORDER — BISACODYL 10 MG RE SUPP
10.0000 mg | Freq: Every day | RECTAL | Status: DC | PRN
  Filled 2021-06-04: qty 1

## 2021-06-04 MED ORDER — SODIUM CHLORIDE 0.9 % IV SOLN: INTRAVENOUS | Status: DC

## 2021-06-04 MED ORDER — GUAIFENESIN ER 600 MG PO TB12
600.0000 mg | ORAL_TABLET | Freq: Two times a day (BID) | ORAL | Status: DC
  Administered 2021-06-04 – 2021-06-07 (×5): 600 mg via ORAL
  Filled 2021-06-04 (×5): qty 1

## 2021-06-04 MED ORDER — FENTANYL CITRATE (PF) 100 MCG/2ML IJ SOLN: 25.0000 ug | INTRAMUSCULAR | Status: DC | PRN

## 2021-06-04 MED ORDER — METOCLOPRAMIDE HCL 10 MG PO TABS: 5.0000 mg | ORAL_TABLET | Freq: Three times a day (TID) | ORAL | Status: DC | PRN

## 2021-06-04 MED ORDER — DICYCLOMINE HCL 10 MG PO CAPS
10.0000 mg | ORAL_CAPSULE | Freq: Two times a day (BID) | ORAL | Status: DC | PRN
  Filled 2021-06-04: qty 1

## 2021-06-04 MED ORDER — ACETAMINOPHEN 10 MG/ML IV SOLN: 1000.0000 mg | Freq: Once | INTRAVENOUS | Status: DC | PRN

## 2021-06-04 MED ORDER — BUPIVACAINE LIPOSOME 1.3 % IJ SUSP
INTRAMUSCULAR | Status: DC | PRN
  Administered 2021-06-04: 20 mL

## 2021-06-04 MED ORDER — AMLODIPINE BESYLATE 5 MG PO TABS
5.0000 mg | ORAL_TABLET | Freq: Every day | ORAL | Status: DC
  Administered 2021-06-06 – 2021-06-07 (×2): 5 mg via ORAL
  Filled 2021-06-04 (×2): qty 1

## 2021-06-04 MED ORDER — METOCLOPRAMIDE HCL 5 MG/ML IJ SOLN: 5.0000 mg | Freq: Three times a day (TID) | INTRAMUSCULAR | Status: DC | PRN

## 2021-06-04 MED ORDER — ONDANSETRON HCL 4 MG/2ML IJ SOLN: 4.0000 mg | Freq: Once | INTRAMUSCULAR | Status: DC | PRN

## 2021-06-04 MED ORDER — ENOXAPARIN SODIUM 40 MG/0.4ML IJ SOSY
40.0000 mg | PREFILLED_SYRINGE | INTRAMUSCULAR | Status: DC
  Administered 2021-06-05 – 2021-06-06 (×2): 40 mg via SUBCUTANEOUS
  Filled 2021-06-04 (×2): qty 0.4

## 2021-06-04 MED ORDER — DOCUSATE SODIUM 100 MG PO CAPS
100.0000 mg | ORAL_CAPSULE | Freq: Two times a day (BID) | ORAL | Status: DC
  Administered 2021-06-04 – 2021-06-07 (×5): 100 mg via ORAL
  Filled 2021-06-04 (×6): qty 1

## 2021-06-04 MED ORDER — METOPROLOL SUCCINATE ER 25 MG PO TB24
25.0000 mg | ORAL_TABLET | Freq: Every day | ORAL | Status: DC
  Administered 2021-06-04 – 2021-06-07 (×4): 25 mg via ORAL
  Filled 2021-06-04 (×4): qty 1

## 2021-06-04 MED ORDER — FENTANYL CITRATE (PF) 100 MCG/2ML IJ SOLN
INTRAMUSCULAR | Status: AC
  Filled 2021-06-04: qty 2

## 2021-06-04 MED ORDER — TRANEXAMIC ACID 1000 MG/10ML IV SOLN
INTRAVENOUS | Status: DC | PRN
  Administered 2021-06-04: 1000 mg via INTRAVENOUS

## 2021-06-04 MED ORDER — FENTANYL CITRATE PF 50 MCG/ML IJ SOSY
50.0000 ug | PREFILLED_SYRINGE | Freq: Once | INTRAMUSCULAR | Status: AC
  Administered 2021-06-04: 50 ug via INTRAVENOUS
  Filled 2021-06-04: qty 1

## 2021-06-04 MED ORDER — SODIUM CHLORIDE (PF) 0.9 % IJ SOLN
INTRAMUSCULAR | Status: DC | PRN
  Administered 2021-06-04: 40 mL via INTRAVENOUS

## 2021-06-04 MED ORDER — ACETAMINOPHEN 500 MG PO TABS
1000.0000 mg | ORAL_TABLET | Freq: Four times a day (QID) | ORAL | Status: AC
  Administered 2021-06-04 – 2021-06-05 (×4): 1000 mg via ORAL
  Filled 2021-06-04 (×4): qty 2

## 2021-06-04 MED ORDER — MORPHINE SULFATE (PF) 2 MG/ML IV SOLN
2.0000 mg | INTRAVENOUS | Status: DC | PRN
Start: 2021-06-04 — End: 2021-06-07
  Administered 2021-06-04 (×2): 2 mg via INTRAVENOUS
  Filled 2021-06-04 (×2): qty 1

## 2021-06-04 MED ORDER — ACETAMINOPHEN 10 MG/ML IV SOLN
INTRAVENOUS | Status: DC | PRN
  Administered 2021-06-04: 1000 mg via INTRAVENOUS

## 2021-06-04 MED ORDER — OXYCODONE HCL 5 MG PO TABS
2.5000 mg | ORAL_TABLET | Freq: Four times a day (QID) | ORAL | Status: DC | PRN
  Administered 2021-06-04: 2.5 mg via ORAL
  Filled 2021-06-04 (×2): qty 1

## 2021-06-04 MED ORDER — CEFAZOLIN SODIUM-DEXTROSE 2-3 GM-%(50ML) IV SOLR
INTRAVENOUS | Status: DC | PRN
  Administered 2021-06-04: 2 g via INTRAVENOUS

## 2021-06-04 MED ORDER — ACETAMINOPHEN 10 MG/ML IV SOLN
INTRAVENOUS | Status: AC
  Filled 2021-06-04: qty 100

## 2021-06-04 MED ORDER — FENTANYL CITRATE PF 50 MCG/ML IJ SOSY
25.0000 ug | PREFILLED_SYRINGE | Freq: Once | INTRAMUSCULAR | Status: AC
  Administered 2021-06-04: 25 ug via INTRAVENOUS
  Filled 2021-06-04: qty 1

## 2021-06-04 MED ORDER — PROPOFOL 500 MG/50ML IV EMUL
INTRAVENOUS | Status: DC | PRN
  Administered 2021-06-04: 50 ug/kg/min via INTRAVENOUS

## 2021-06-04 MED ORDER — BUDESONIDE 0.25 MG/2ML IN SUSP
2.0000 mL | Freq: Two times a day (BID) | RESPIRATORY_TRACT | Status: DC
  Administered 2021-06-05 – 2021-06-07 (×5): 0.25 mg via RESPIRATORY_TRACT
  Filled 2021-06-04 (×5): qty 2

## 2021-06-04 MED ORDER — PHENYLEPHRINE HCL-NACL 20-0.9 MG/250ML-% IV SOLN
INTRAVENOUS | Status: AC
  Filled 2021-06-04: qty 250

## 2021-06-04 MED ORDER — FENTANYL CITRATE (PF) 100 MCG/2ML IJ SOLN
INTRAMUSCULAR | Status: DC | PRN
  Administered 2021-06-04 (×2): 25 ug via INTRAVENOUS

## 2021-06-04 MED ORDER — BUPIVACAINE HCL (PF) 0.5 % IJ SOLN: INTRAMUSCULAR | Status: DC | PRN

## 2021-06-04 MED ORDER — DIPHENHYDRAMINE HCL 12.5 MG/5ML PO ELIX: 12.5000 mg | ORAL_SOLUTION | ORAL | Status: DC | PRN

## 2021-06-04 MED ORDER — BUPIVACAINE-EPINEPHRINE (PF) 0.5% -1:200000 IJ SOLN
INTRAMUSCULAR | Status: DC | PRN
  Administered 2021-06-04: 30 mL via PERINEURAL

## 2021-06-04 MED ORDER — BUPIVACAINE-EPINEPHRINE (PF) 0.5% -1:200000 IJ SOLN
INTRAMUSCULAR | Status: AC
  Filled 2021-06-04: qty 30

## 2021-06-04 MED ORDER — PHENYLEPHRINE HCL-NACL 20-0.9 MG/250ML-% IV SOLN
INTRAVENOUS | Status: DC | PRN
  Administered 2021-06-04: 40 ug/min via INTRAVENOUS

## 2021-06-04 MED ORDER — CLINDAMYCIN PHOSPHATE 900 MG/50ML IV SOLN: 900.0000 mg | Freq: Once | INTRAVENOUS | Status: DC

## 2021-06-04 MED ORDER — ROPINIROLE HCL 1 MG PO TABS
0.5000 mg | ORAL_TABLET | Freq: Three times a day (TID) | ORAL | Status: DC
  Administered 2021-06-04 – 2021-06-07 (×8): 0.5 mg via ORAL
  Filled 2021-06-04 (×6): qty 1
  Filled 2021-06-04 (×2): qty 2
  Filled 2021-06-04 (×2): qty 1

## 2021-06-04 MED ORDER — PROPOFOL 10 MG/ML IV BOLUS
INTRAVENOUS | Status: DC | PRN
  Administered 2021-06-04 (×5): 10 mg via INTRAVENOUS

## 2021-06-04 MED ORDER — PHENYLEPHRINE HCL (PRESSORS) 10 MG/ML IV SOLN
INTRAVENOUS | Status: DC | PRN
  Administered 2021-06-04: 200 ug via INTRAVENOUS
  Administered 2021-06-04 (×2): 100 ug via INTRAVENOUS

## 2021-06-04 MED ORDER — ONDANSETRON HCL 4 MG/2ML IJ SOLN: 4.0000 mg | Freq: Four times a day (QID) | INTRAMUSCULAR | Status: DC | PRN

## 2021-06-04 MED ORDER — PROPOFOL 10 MG/ML IV BOLUS
INTRAVENOUS | Status: AC
  Filled 2021-06-04: qty 20

## 2021-06-04 MED ORDER — BUPIVACAINE LIPOSOME 1.3 % IJ SUSP
INTRAMUSCULAR | Status: AC
  Filled 2021-06-04: qty 20

## 2021-06-04 MED ORDER — OXYCODONE HCL 5 MG PO TABS
10.0000 mg | ORAL_TABLET | ORAL | Status: DC | PRN
  Administered 2021-06-05 – 2021-06-06 (×4): 10 mg via ORAL
  Filled 2021-06-04 (×4): qty 2

## 2021-06-04 MED ORDER — MAGNESIUM HYDROXIDE 400 MG/5ML PO SUSP
30.0000 mL | Freq: Every day | ORAL | Status: DC | PRN
  Administered 2021-06-05: 30 mL via ORAL
  Filled 2021-06-04: qty 30

## 2021-06-04 MED ORDER — CLINDAMYCIN PHOSPHATE 600 MG/50ML IV SOLN
600.0000 mg | Freq: Four times a day (QID) | INTRAVENOUS | Status: AC
  Administered 2021-06-04 – 2021-06-05 (×3): 600 mg via INTRAVENOUS
  Filled 2021-06-04 (×3): qty 50

## 2021-06-04 MED ORDER — BUPIVACAINE HCL (PF) 0.5 % IJ SOLN
INTRAMUSCULAR | Status: DC | PRN
  Administered 2021-06-04: 2.5 mL via INTRATHECAL

## 2021-06-04 MED ORDER — PHENYLEPHRINE HCL (PRESSORS) 10 MG/ML IV SOLN
INTRAVENOUS | Status: AC
  Filled 2021-06-04: qty 1

## 2021-06-04 MED ORDER — SODIUM CHLORIDE 0.9 % IV BOLUS
500.0000 mL | Freq: Once | INTRAVENOUS | Status: AC
  Administered 2021-06-04: 500 mL via INTRAVENOUS

## 2021-06-04 MED ORDER — FLEET ENEMA 7-19 GM/118ML RE ENEM: 1.0000 | ENEMA | Freq: Once | RECTAL | Status: DC | PRN

## 2021-06-04 MED ORDER — MONTELUKAST SODIUM 10 MG PO TABS
10.0000 mg | ORAL_TABLET | Freq: Every day | ORAL | Status: DC
  Administered 2021-06-04 – 2021-06-05 (×2): 10 mg via ORAL
  Filled 2021-06-04 (×3): qty 1

## 2021-06-04 MED ORDER — ACETAMINOPHEN 325 MG PO TABS
325.0000 mg | ORAL_TABLET | Freq: Four times a day (QID) | ORAL | Status: DC | PRN
  Administered 2021-06-06: 650 mg via ORAL
  Filled 2021-06-04 (×2): qty 2

## 2021-06-04 MED ORDER — ALBUTEROL SULFATE (2.5 MG/3ML) 0.083% IN NEBU
3.0000 mL | INHALATION_SOLUTION | Freq: Four times a day (QID) | RESPIRATORY_TRACT | Status: DC | PRN
  Administered 2021-06-06: 3 mL via RESPIRATORY_TRACT
  Filled 2021-06-04: qty 3

## 2021-06-04 MED ORDER — ONDANSETRON HCL 4 MG PO TABS
4.0000 mg | ORAL_TABLET | Freq: Four times a day (QID) | ORAL | Status: DC | PRN
  Administered 2021-06-05: 4 mg via ORAL
  Filled 2021-06-04: qty 1

## 2021-06-04 MED ORDER — SODIUM CHLORIDE 0.9 % IV SOLN
INTRAVENOUS | Status: DC | PRN
  Administered 2021-06-04: 100.2 mL via TOPICAL

## 2021-06-04 MED ORDER — IPRATROPIUM-ALBUTEROL 0.5-2.5 (3) MG/3ML IN SOLN
3.0000 mL | Freq: Once | RESPIRATORY_TRACT | Status: AC
  Administered 2021-06-04: 3 mL via RESPIRATORY_TRACT

## 2021-06-04 MED ORDER — DULOXETINE HCL 60 MG PO CPEP
60.0000 mg | ORAL_CAPSULE | Freq: Every day | ORAL | Status: DC
  Administered 2021-06-04 – 2021-06-07 (×4): 60 mg via ORAL
  Filled 2021-06-04 (×4): qty 1

## 2021-06-04 SURGICAL SUPPLY — 74 items
BAG DECANTER FOR FLEXI CONT (MISCELLANEOUS) ×2 IMPLANT
BLADE SAGITTAL WIDE XTHICK NO (BLADE) ×2 IMPLANT
BLADE SAW SAG 25.4X90 (BLADE) ×2 IMPLANT
BLADE SURG SZ20 CARB STEEL (BLADE) ×2 IMPLANT
BNDG COHESIVE 6X5 TAN ST LF (GAUZE/BANDAGES/DRESSINGS) ×2 IMPLANT
BOWL CEMENT MIXING ADV NOZZLE (MISCELLANEOUS) ×2 IMPLANT
CEMENT BONE 40GM (Cement) ×4 IMPLANT
CEMENT RESTRICTOR DEPUY SZ 4 (Cement) ×2 IMPLANT
CENTRALIZER STM 11XPSTNR STRL (Orthopedic Implant) ×1 IMPLANT
CHLORAPREP W/TINT 26 (MISCELLANEOUS) ×4 IMPLANT
CNTRLZR STM 11XPSTNR STRL (Orthopedic Implant) ×1 IMPLANT
DECANTER SPIKE VIAL GLASS SM (MISCELLANEOUS) ×4 IMPLANT
DISTAL CENTRALIZER 11MM (Orthopedic Implant) ×1 IMPLANT
DRAPE 3/4 80X56 (DRAPES) ×2 IMPLANT
DRAPE IMP U-DRAPE 54X76 (DRAPES) ×6 IMPLANT
DRAPE INCISE IOBAN 66X60 STRL (DRAPES) ×2 IMPLANT
DRAPE SURG 17X11 SM STRL (DRAPES) ×2 IMPLANT
DRAPE SURG 17X23 STRL (DRAPES) ×2 IMPLANT
DRSG OPSITE POSTOP 4X12 (GAUZE/BANDAGES/DRESSINGS) IMPLANT
DRSG OPSITE POSTOP 4X14 (GAUZE/BANDAGES/DRESSINGS) IMPLANT
DRSG OPSITE POSTOP 4X8 (GAUZE/BANDAGES/DRESSINGS) ×2 IMPLANT
ELECT CAUTERY BLADE 6.4 (BLADE) ×2 IMPLANT
ELECT REM PT RETURN 9FT ADLT (ELECTROSURGICAL) ×2
ELECTRODE REM PT RTRN 9FT ADLT (ELECTROSURGICAL) ×1 IMPLANT
GAUZE 4X4 16PLY ~~LOC~~+RFID DBL (SPONGE) ×2 IMPLANT
GAUZE PACK 2X3YD (PACKING) ×2 IMPLANT
GAUZE XEROFORM 1X8 LF (GAUZE/BANDAGES/DRESSINGS) ×2 IMPLANT
GLOVE SRG 8 PF TXTR STRL LF DI (GLOVE) IMPLANT
GLOVE SURG ENC MOIS LTX SZ8 (GLOVE) ×6 IMPLANT
GLOVE SURG ENC TEXT LTX SZ7.5 (GLOVE) IMPLANT
GLOVE SURG PR MICRO ENCORE 7.5 (GLOVE) ×2 IMPLANT
GLOVE SURG UNDER LTX SZ8 (GLOVE) ×2 IMPLANT
GLOVE SURG UNDER POLY LF SZ8 (GLOVE)
GOWN STRL REUS W/ TWL LRG LVL3 (GOWN DISPOSABLE) ×2 IMPLANT
GOWN STRL REUS W/ TWL XL LVL3 (GOWN DISPOSABLE) ×1 IMPLANT
GOWN STRL REUS W/TWL LRG LVL3 (GOWN DISPOSABLE) ×2
GOWN STRL REUS W/TWL XL LVL3 (GOWN DISPOSABLE) ×1
HEAD MOD COCR 28MM HD -3MM NK (Orthopedic Implant) ×2 IMPLANT
HOOD PEEL AWAY FLYTE STAYCOOL (MISCELLANEOUS) ×2 IMPLANT
IV NS 100ML SINGLE PACK (IV SOLUTION) IMPLANT
IV NS IRRIG 3000ML ARTHROMATIC (IV SOLUTION) ×4 IMPLANT
LABEL OR SOLS (LABEL) ×2 IMPLANT
MANIFOLD NEPTUNE II (INSTRUMENTS) ×2 IMPLANT
NDL SAFETY ECLIPSE 18X1.5 (NEEDLE) ×1 IMPLANT
NEEDLE FILTER BLUNT 18X 1/2SAF (NEEDLE) ×1
NEEDLE FILTER BLUNT 18X1 1/2 (NEEDLE) ×1 IMPLANT
NEEDLE HYPO 18GX1.5 SHARP (NEEDLE) ×1
NEEDLE SPNL 20GX3.5 QUINCKE YW (NEEDLE) ×2 IMPLANT
NS IRRIG 1000ML POUR BTL (IV SOLUTION) ×2 IMPLANT
PACK HIP PROSTHESIS (MISCELLANEOUS) ×2 IMPLANT
PENCIL SMOKE EVACUATOR (MISCELLANEOUS) ×2 IMPLANT
PRESSURIZER FEM CANAL M (MISCELLANEOUS) ×2 IMPLANT
PULSAVAC PLUS IRRIG FAN TIP (DISPOSABLE) ×2
RINGBLOC BI POLAR 28X48MM (Orthopedic Implant) ×2 IMPLANT
SHELL RINGBLOC BI POLR 28X48MM (Orthopedic Implant) ×1 IMPLANT
SPONGE T-LAP 18X18 ~~LOC~~+RFID (SPONGE) ×8 IMPLANT
STAPLER SKIN PROX 35W (STAPLE) ×2 IMPLANT
STEM HIP FEM 11X140 TAPER (Stem) ×2 IMPLANT
STRAP SAFETY 5IN WIDE (MISCELLANEOUS) ×2 IMPLANT
SUT ETHIBOND 2 V 37 (SUTURE) ×2 IMPLANT
SUT VIC AB 1 CT1 36 (SUTURE) IMPLANT
SUT VIC AB 2-0 CT1 (SUTURE) ×4 IMPLANT
SUT VIC AB 2-0 CT1 27 (SUTURE)
SUT VIC AB 2-0 CT1 TAPERPNT 27 (SUTURE) IMPLANT
SUT VICRYL 1-0 27IN ABS (SUTURE) ×4
SUTURE VICRYL 1-0 27IN ABS (SUTURE) ×2 IMPLANT
SYR 10ML LL (SYRINGE) ×4 IMPLANT
SYR 30ML LL (SYRINGE) ×6 IMPLANT
SYR TB 1ML 27GX1/2 LL (SYRINGE) IMPLANT
TAPE TRANSPORE STRL 2 31045 (GAUZE/BANDAGES/DRESSINGS) ×2 IMPLANT
TIP BRUSH PULSAVAC PLUS 24.33 (MISCELLANEOUS) ×2 IMPLANT
TIP FAN IRRIG PULSAVAC PLUS (DISPOSABLE) ×1 IMPLANT
TRAP FLUID SMOKE EVACUATOR (MISCELLANEOUS) ×2 IMPLANT
WATER STERILE IRR 500ML POUR (IV SOLUTION) ×2 IMPLANT

## 2021-06-04 NOTE — ED Provider Notes (Signed)
[facility] Emergency Department Provider Note   ____________________________________________    I have reviewed the triage vital signs and the nursing notes.   HISTORY  Chief Complaint Leg Pain  History somewhat limited by dementia   HPI Brittany Munoz is a 87 y.o. female who presents with complaints of left hip pain that is severe.  Patient reports pain is worse with movement of her left leg, the pain radiates from her left hip to her left thigh.  She reports it started 2 weeks ago, reportedly had negative imaging but not clear to me if that is the case.  No fevers or erythema.  Typically ambulates with a walker but has been unable to do that  Past Medical History:  Diagnosis Date   Anxiety    Asthma    Cardiomyopathy (HCC)    Cataract    COPD (chronic obstructive pulmonary disease) (HCC)    Degenerative arthritis    Depression    Diabetes (HCC)    Eczema    Gout    Hemorrhoids    Hyperlipidemia    Hypertension    Nocturnal hypoxia    Osteoporosis    Pulmonary fibrosis (HCC)    Seasonal allergies    Shingles     Patient Active Problem List   Diagnosis Date Noted   Bilateral hip pain 05/14/2021   Hyperlipidemia, unspecified 12/24/2016   Osteoporosis 12/24/2016   Pulmonary fibrosis (HCC) 12/24/2016   Wrist fracture, left 12/24/2016   Tachy-brady syndrome (HCC) 11/18/2016   B12 deficiency 11/03/2016   Chronic constipation 08/29/2016   Dyspnea 03/14/2016   Abdominal pain, RLQ (right lower quadrant) 10/08/2015   Chronic hyperglycemia 10/08/2015   COPD, moderate (HCC) 01/10/2015   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) 01/10/2015   Anemia, unspecified 10/18/2013   Chronic obstructive airway disease with asthma (HCC) 10/18/2013   Benign essential hypertension 10/18/2013   Osteoarthrosis involving lower leg 10/18/2013   Vitamin D deficiency, unspecified 10/18/2013    Past Surgical History:  Procedure  Laterality Date   BLADDER REPAIR     BREAST CYST REMOVAL     BROKEN ANKLE     CARDIAC CATHETERIZATION     CHOLECYSTECTOMY     JOINT REPLACEMENT     KNEE ARTHROSCOPY     MOUTH SURGERY     OPEN REDUCTION INTERNAL FIXATION (ORIF) DISTAL RADIAL FRACTURE     REPAIR ROTATOR CUFF TEAR     SKIN CANCER REMOVED     RT KNEE AND FOREHEAD   TONSILLECTOMY AND ADENOIDECTOMY     WISDOM TOOTH REMOVAL      Prior to Admission medications   Medication Sig Start Date End Date Taking? Authorizing Provider  acetaminophen (TYLENOL) 650 MG CR tablet Take 1,300 mg by mouth 2 (two) times daily.    [provider]  amLODipine (NORVASC) 5 MG tablet Take 5 mg by mouth daily. 05/01/21   [provider]  budesonide (PULMICORT) 0.25 MG/2ML nebulizer solution Inhale 2 mLs into the lungs 2 (two) times daily as needed for wheezing or shortness of breath. 09/17/20   [provider]  cyanocobalamin (,VITAMIN B-12,) 1000 MCG/ML injection Inject 1,000 mcg into the muscle every 30 (thirty) days. 05/19/16   [provider]  dicyclomine (BENTYL) 10 MG capsule Take 10 mg by mouth 2 (two) times daily as needed. 01/24/20   [provider]  DULoxetine (CYMBALTA) 60 MG capsule Take 60 mg by mouth daily. 10/18/14   [provider]  guaiFENesin (MUCINEX) 600 MG 12 hr tablet Take 600 mg by mouth 2 times daily at 12 noon and 4 pm.    [provider]  metoprolol succinate (TOPROL-XL) 25 MG 24 hr tablet Take 25 mg by mouth daily. 11/02/14 05/14/21  [provider]  montelukast (SINGULAIR) 10 MG tablet Take 10 mg by mouth daily. 10/18/14   [provider]  omeprazole (PRILOSEC) 40 MG capsule Take 40 mg by mouth daily. 10/18/14   [provider]  PROAIR HFA 108 (90 Base) MCG/ACT inhaler USE 2 PUFFS EVERY 4 HOURS  AS NEEDED 11/03/18   Erin Fulling, MD  rOPINIRole (REQUIP) 0.5 MG tablet Take 0.5 mg by mouth 3 (three) times daily. 04/30/21   [provider]   torsemide (DEMADEX) 20 MG tablet Take 20 mg by mouth daily. 07/06/14   [provider]  Vitamin D, Ergocalciferol, (DRISDOL) 50000 UNITS CAPS capsule Take 1 capsule by mouth once a week. 12/20/14   [provider]     Allergies Aspirin, Cefuroxime axetil, Moxifloxacin hcl in nacl, Oxycodone, Sulfamethoxazole-trimethoprim, and Tramadol  Family History  Problem Relation Age of Onset   Hypertension Mother    Asthma Mother    COPD Mother    Arthritis Mother    Colon cancer Father    Arthritis Sister    Prostate cancer Neg Hx    Bladder Cancer Neg Hx    Kidney cancer Neg Hx     Social History Social History   Tobacco Use   Smoking status: Former    Packs/day: 2.00    Years: 25.00    Pack years: 50.00    Types: Cigarettes    Quit date: 12/16/1963    Years since quitting: 57.5   Smokeless tobacco: Never  Substance Use Topics   Alcohol use: No    Alcohol/week: 0.0 standard drinks   Drug use: No    Review of Systems  Constitutional: No fever/chills Eyes: No visual changes.  ENT: No sore throat. Cardiovascular: Denies chest pain. Respiratory: Denies shortness of breath. Gastrointestinal: No abdominal pain.  No vomiting Genitourinary: Negative for dysuria.  No rash Musculoskeletal: As above Skin: Negative for rash. Neurological: Negative for headaches or weakness   ____________________________________________   PHYSICAL EXAM:  VITAL SIGNS: ED Triage Vitals  Enc Vitals Group     BP 06/03/21 1940 (!) 158/132     Pulse Rate 06/03/21 1458 89     Resp 06/03/21 1458 16     Temp 06/03/21 1458 98.9 F (37.2 C)     Temp Source 06/03/21 1458 Oral     SpO2 06/03/21 1458 96 %     Weight 06/03/21 1459 74.8 kg (165 lb)     Height 06/03/21 1459 1.727 m (5\' 8" )     Head Circumference --      Peak Flow --      Pain Score 06/03/21 1459 8     Pain Loc --      Pain Edu? --      Excl. in GC? --     Constitutional: Alert No acute distress.  Eyes:  Conjunctivae are normal.  Head: Atraumatic. Nose: No congestion/rhinnorhea. Mouth/Throat: Mucous membranes are moist.   Neck:  Painless ROM Cardiovascular: Normal rate, regular rhythm. Grossly normal heart sounds.  Good peripheral circulation. Respiratory: Normal respiratory effort.  No retractions. Lungs CTAB. Gastrointestinal: Soft and nontender. No distention.  No CVA tenderness.  Musculoskeletal: Painful range of motion of the left hip, pain with axial  load on the left hip.  Pain with flexing the left hip.  No redness or swelling or bruising perineum is normal Neurologic:  Normal speech and language. No gross focal neurologic deficits are appreciated.  Skin:  Skin is warm, dry and intact. No rash noted. Psychiatric: Mood and affect are normal. Speech and behavior are normal.  ____________________________________________   LABS (all labs ordered are listed, but only abnormal results are displayed)  Labs Reviewed  CBC - Abnormal; Notable for the following components:      Result Value   MCV 78.5 (*)    MCH 23.9 (*)    All other components within normal limits  COMPREHENSIVE METABOLIC PANEL - Abnormal; Notable for the following components:   Potassium 3.4 (*)    Glucose, Bld 128 (*)    BUN 27 (*)    Creatinine, Ser 1.21 (*)    GFR, Estimated 42 (*)    All other components within normal limits   ____________________________________________  EKG  None ____________________________________________  RADIOLOGY  Pelvis x-ray reviewed by me, no acute fracture ____________________________________________   PROCEDURES  Procedure(s) performed: No  Procedures   Critical Care performed: No ____________________________________________   INITIAL IMPRESSION / ASSESSMENT AND PLAN / ED COURSE  Pertinent labs & imaging results that were available during my care of the patient were reviewed by me and considered in my medical decision making (see chart for details).  patient  with clear pain in the hip with any movement of the left leg, x-rays were overall reassuring, question fracture given her pain level.  We will give IV fentanyl, sent for CT  CT demonstrates femoral neck fracture.  Have consulted dr. Joice Lofts of orthopedics  Will admit to the hospitalist service    ____________________________________________   FINAL CLINICAL IMPRESSION(S) / ED DIAGNOSES  Final diagnoses:  Closed fracture of left hip, initial encounter [facility])        Note:  This document was prepared using Dragon voice recognition software and may include unintentional dictation errors.    Jene Every, MD 06/04/21 1006

## 2021-06-04 NOTE — H&P (Signed)
[facility] NAME: Brittany Munoz    MR#:  [id number]  DATE OF BIRTH:  [birth year]  DATE OF ADMISSION:  06/04/2021  PRIMARY CARE PHYSICIAN: Leonel Ramsay, MD   REQUESTING/REFERRING PHYSICIAN: Dr. Ellwood Dense  CHIEF COMPLAINT:   Chief Complaint  Patient presents with   Leg Pain    HISTORY OF PRESENT ILLNESS:  Brittany Munoz  is a 87 y.o. female with a known history of COPD on home oxygen, hypertension, pulmonary fibrosis.  Patient states that she has had a week of pain on her left hip and the last few days not been able to get around very well.  Pain worse when she is trying to go to the bathroom.  She does not recall having a fall.  In the ER she is in a lot of pain.  Given couple doses of fentanyl.  ER physician did a CT scan of the left hip which showed a displaced left hip fracture.  Hospitalist services contacted for admission.  PAST MEDICAL HISTORY:   Past Medical History:  Diagnosis Date   Anxiety    Asthma    Cardiomyopathy ([place])    Cataract    COPD (chronic obstructive pulmonary disease) (HCC)    Degenerative arthritis    Depression    Diabetes ([place])    Eczema    Gout    Hemorrhoids    Hyperlipidemia    Hypertension    Nocturnal hypoxia    Osteoporosis    Pulmonary fibrosis (HCC)    Seasonal allergies    Shingles     PAST SURGICAL HISTORY:   Past Surgical History:  Procedure Laterality Date   BLADDER REPAIR     BREAST CYST REMOVAL     BROKEN ANKLE     CARDIAC CATHETERIZATION     CHOLECYSTECTOMY     JOINT REPLACEMENT     KNEE ARTHROSCOPY     MOUTH SURGERY     OPEN REDUCTION INTERNAL FIXATION (ORIF) DISTAL RADIAL FRACTURE     REPAIR ROTATOR CUFF TEAR     SKIN CANCER REMOVED     RT KNEE AND FOREHEAD   TONSILLECTOMY AND ADENOIDECTOMY     WISDOM TOOTH REMOVAL      SOCIAL HISTORY:   Social History   Tobacco Use   Smoking status: Former    Packs/day: 2.00    Years: 25.00    Pack years:  50.00    Types: Cigarettes    Quit date: 12/16/1963    Years since quitting: 57.5   Smokeless tobacco: Never  Substance Use Topics   Alcohol use: No    Alcohol/week: 0.0 standard drinks    FAMILY HISTORY:   Family History  Problem Relation Age of Onset   Hypertension Mother    Asthma Mother    COPD Mother    Arthritis Mother    Colon cancer Father    Arthritis Sister    Prostate cancer Neg Hx    Bladder Cancer Neg Hx    Kidney cancer Neg Hx     DRUG ALLERGIES:   Allergies  Allergen Reactions   Aspirin Anaphylaxis   Cefuroxime Axetil Swelling   Moxifloxacin Hcl In Nacl     Other reaction(s): Unknown   Oxycodone Nausea Only   Sulfamethoxazole-Trimethoprim     Other reaction(s): Unknown   Tramadol     Other reaction(s): Unknown    REVIEW OF SYSTEMS:  CONSTITUTIONAL: No fever, feels hot.  Positive pain left hip.  EYES: No blurred or double vision.  EARS, NOSE, AND THROAT: No tinnitus or ear pain.  Some sore throat RESPIRATORY: Always has some cough, shortness of breath, and wheezing.  No hemoptysis.  CARDIOVASCULAR: No chest pain, orthopnea, edema.  GASTROINTESTINAL: Some nausea.  No vomiting, diarrhea or abdominal pain. No blood in bowel movements GENITOURINARY: No dysuria, hematuria.  ENDOCRINE: No polyuria, nocturia,  HEMATOLOGY: No anemia, easy bruising or bleeding SKIN: No rash or lesion. MUSCULOSKELETAL: No joint pain or arthritis.   NEUROLOGIC: No tingling, numbness, weakness.  PSYCHIATRY: No anxiety or depression.   MEDICATIONS AT HOME:   Prior to Admission medications   Medication Sig Start Date End Date Taking? Authorizing Provider  acetaminophen (TYLENOL) 650 MG CR tablet Take 1,300 mg by mouth 2 (two) times daily.    [provider]  amLODipine (NORVASC) 5 MG tablet Take 5 mg by mouth daily. 05/01/21   [provider]  budesonide (PULMICORT) 0.25 MG/2ML nebulizer solution Inhale 2 mLs into the lungs 2 (two) times daily as needed for  wheezing or shortness of breath. 09/17/20   [provider]  cyanocobalamin (,VITAMIN B-12,) 1000 MCG/ML injection Inject 1,000 mcg into the muscle every 30 (thirty) days. 05/19/16   [provider]  dicyclomine (BENTYL) 10 MG capsule Take 10 mg by mouth 2 (two) times daily as needed. 01/24/20   [provider]  DULoxetine (CYMBALTA) 60 MG capsule Take 60 mg by mouth daily. 10/18/14   [provider]  guaiFENesin (MUCINEX) 600 MG 12 hr tablet Take 600 mg by mouth 2 times daily at 12 noon and 4 pm.    [provider]  metoprolol succinate (TOPROL-XL) 25 MG 24 hr tablet Take 25 mg by mouth daily. 11/02/14 05/14/21  [provider]  montelukast (SINGULAIR) 10 MG tablet Take 10 mg by mouth daily. 10/18/14   [provider]  omeprazole (PRILOSEC) 40 MG capsule Take 40 mg by mouth daily. 10/18/14   [provider]  oxyCODONE (OXY IR/ROXICODONE) 5 MG immediate release tablet Take 2.5 mg by mouth every 6 (six) hours as needed for pain. 05/29/21   [provider]  PROAIR HFA 108 (90 Base) MCG/ACT inhaler USE 2 PUFFS EVERY 4 HOURS  AS NEEDED 11/03/18   Erin Fulling, MD  rOPINIRole (REQUIP) 0.5 MG tablet Take 0.5 mg by mouth 3 (three) times daily. 04/30/21   [provider]  torsemide (DEMADEX) 20 MG tablet Take 20 mg by mouth daily. 07/06/14   [provider]  Vitamin D, Ergocalciferol, (DRISDOL) 50000 UNITS CAPS capsule Take 1 capsule by mouth once a week. 12/20/14   [provider]      VITAL SIGNS:  Blood pressure (!) 185/91, pulse (!) 108, temperature 97.8 F (36.6 C), temperature source Oral, resp. rate 20, height 5\' 8"  (1.727 m), weight 74.8 kg, SpO2 98 %.  PHYSICAL EXAMINATION:  GENERAL:  87 y.o.-year-old patient lying in the bed with no acute distress.  EYES: Pupils equal, round, reactive to light and accommodation. No scleral icterus.   HEENT: Head atraumatic, normocephalic. Oropharynx and  nasopharynx clear.  NECK:  Supple, no jugular venous distention. No thyroid enlargement, no tenderness.  LUNGS: Decreased breath sounds bilateral bases, no wheezing, rales,rhonchi or crepitation. No use of accessory muscles of respiration.  CARDIOVASCULAR: S1, S2 tachycardic. No murmurs, rubs, or gallops.  ABDOMEN: Soft, nontender, nondistended. Bowel sounds present. No organomegaly or mass.  EXTREMITIES: No pedal edema, cyanosis, or clubbing.  Left leg shortened and externally rotated  NEUROLOGIC: Cranial nerves II through XII are intact.  Able to flex and extend at the left ankle. Sensation intact. Gait not checked.  PSYCHIATRIC: The patient is alert and all questions appropriately.  SKIN: No rash, lesion, or ulcer.   LABORATORY PANEL:   CBC Recent Labs  Lab 06/04/21 0856  WBC 10.2  HGB 12.2  HCT 40.1  PLT 397   ------------------------------------------------------------------------------------------------------------------  Chemistries  Recent Labs  Lab 06/04/21 0856  NA 138  K 3.4*  CL 98  CO2 28  GLUCOSE 128*  BUN 27*  CREATININE 1.21*  CALCIUM 9.5  AST 18  ALT 11  ALKPHOS 76  BILITOT 0.9   ------------------------------------------------------------------------------------------------------------------   RADIOLOGY:  DG Lumbar Spine 2-3 Views  Result Date: 06/03/2021 CLINICAL DATA:  Trauma, fall, pain EXAM: LUMBAR SPINE - 2-3 VIEW COMPARISON:  MRI lumbar spine done on 01/27/2017 FINDINGS: No recent fracture is seen. There is first-degree anterolisthesis at L4-L5 level. Degenerative changes are noted with marked disc space narrowing, bony spurs and facet hypertrophy at L5-S1 level. Osteopenia is seen in bony structures. There are scattered arterial calcifications. Surgical clips are seen in gallbladder fossa. Marked degenerative changes are noted in right hip. IMPRESSION: No recent fracture is seen. Osteopenia. There is first-degree anterolisthesis at L4-L5 level.  Degenerative changes are noted in lumbar spine, particularly severe at L5-S1 level. Degenerative changes are noted in right hip. Electronically Signed   By: Elmer Picker M.D.   On: 06/03/2021 16:17   DG Pelvis 1-2 Views  Result Date: 06/03/2021 CLINICAL DATA:  Left leg pain after fall. EXAM: PELVIS - 1-2 VIEW COMPARISON:  None. FINDINGS: There is no evidence of pelvic fracture or diastasis. No pelvic bone lesions are seen. Severe degenerative changes seen involving the right hip joint. Mild degenerative changes are seen involving the left hip. IMPRESSION: No acute abnormality seen. Electronically Signed   By: Marijo Conception M.D.   On: 06/03/2021 16:14   CT Hip Left Wo Contrast  Result Date: 06/04/2021 CLINICAL DATA:  Hip pain, stress fracture suspected, neg xray EXAM: CT OF THE LEFT HIP WITHOUT CONTRAST TECHNIQUE: Multidetector CT imaging of the left hip was performed according to the standard protocol. Multiplanar CT image reconstructions were also generated. COMPARISON:  Same day radiograph, CT 05/14/2021 FINDINGS: Bones/Joint/Cartilage There is an acute, mild displaced and impacted left transcervical/basicervical left femoral neck fracture with varus angulation. There is moderate left hip osteoarthritis. Small joint effusion. Ligaments Suboptimally assessed by CT. Muscles and Tendons Mild generalized loss of muscle bulk and moderate to severe gluteus minimus and medius muscle atrophy. No intramuscular collection. Soft tissues Superficial soft tissue swelling along the hip. No focal fluid collection. IMPRESSION: Acute, displaced and mildly impacted left transcervical/basicervical left femoral neck fracture with varus angulation. Electronically Signed   By: Maurine Simmering M.D.   On: 06/04/2021 09:54   DG Chest Port 1 View  Result Date: 06/04/2021 CLINICAL DATA:  Preop evaluation for hip fracture EXAM: PORTABLE CHEST 1 VIEW COMPARISON:  07/29/2013 FINDINGS: Transverse diameter of heart is slightly  increased. There are no signs of pulmonary edema or focal pulmonary consolidation. There is no pleural effusion or pneumothorax. IMPRESSION: There are no signs of pulmonary edema or focal pulmonary consolidation. Electronically Signed   By: Elmer Picker M.D.   On: 06/04/2021 10:39   DG Knee Complete 4 Views Left  Result Date: 06/04/2021 CLINICAL DATA:  Fall and trauma to the left knee. EXAM: LEFT KNEE - COMPLETE 4+ VIEW COMPARISON:  None.  FINDINGS: No acute fracture or dislocation. The bones are osteopenic. Severe arthritic changes with tricompartmental narrowing and spurring. Small suprapatellar effusion. The soft tissues are unremarkable. IMPRESSION: 1. No acute fracture or dislocation. 2. Severe arthritic changes. Electronically Signed   By: Anner Crete M.D.   On: 06/04/2021 00:32    EKG:   Sinus tachycardia nonspecific ST-T wave changes  IMPRESSION AND PLAN:   1.  Closed left displaced hip fracture likely happened about a week ago.  Patient does not recall falling.  No contraindications to surgery at this time. 2.  Tachycardia and history of hypertension restart metoprolol.  Pain control. 3.  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  History of pulmonary fibrosis.  Continue nebulizers and inhalers. 4.  Anxiety depression on Cymbalta 5.  Chronic kidney disease stage IIIa and dehydration hold torsemide for right now.  Gentle IV fluids 6.  Type 2 diabetes mellitus with hemoglobin A1c of 6.0.  Diet controlled.  All the records are reviewed and case discussed with ED provider. Management plans discussed with the patient, family and they are in agreement.  CODE STATUS: Full code  TOTAL TIME TAKING CARE OF THIS PATIENT: 52 minutes.    Loletha Grayer M.D on 06/04/2021 at 10:46 AM  [facility]  CC: Primary care physician; Leonel Ramsay, MD

## 2021-06-04 NOTE — Consult Note (Signed)
ORTHOPAEDIC CONSULTATION  REQUESTING PHYSICIAN: Alford Highland, MD  Chief Complaint:   Left hip pain.  History of Present Illness: Brittany Munoz is a 87 y.o. female with multiple medical problems including COPD, asthma, pulmonary fibrosis, hypertension, hyperlipidemia, diabetes, cardiomyopathy, gout, and anxiety/depression who lives at an independent living facility.  Apparently she fell onto her left side about 3 weeks ago when her left knee apparently gave way on her.  She presented to the emergency room at that time where x-rays and a pelvic CT scan were unremarkable.  The patient was admitted for pain control then sent back to [facility] independent living facility.  However, she continued to complain of severe pain in her left hip and unable to ambulate even with a walker.  Finally, she returned to the emergency room yesterday.  Again, initial x-rays, including an AP pelvis apparently was unremarkable.  However, a follow-up CT scan of the pelvis and left hip confirmed the presence of a varus displaced left femoral neck fracture.  The patient has been readmitted for pain control and definitive management of this injury.  Past Medical History:  Diagnosis Date   Anxiety    Asthma    Cardiomyopathy (HCC)    Cataract    COPD (chronic obstructive pulmonary disease) (HCC)    Degenerative arthritis    Depression    Diabetes (HCC)    Eczema    Gout    Hemorrhoids    Hyperlipidemia    Hypertension    Nocturnal hypoxia    Osteoporosis    Pulmonary fibrosis (HCC)    Seasonal allergies    Shingles    Past Surgical History:  Procedure Laterality Date   BLADDER REPAIR     BREAST CYST REMOVAL     BROKEN ANKLE     CARDIAC CATHETERIZATION     CHOLECYSTECTOMY     JOINT REPLACEMENT     KNEE ARTHROSCOPY     MOUTH SURGERY     OPEN REDUCTION INTERNAL FIXATION (ORIF) DISTAL RADIAL FRACTURE     REPAIR ROTATOR CUFF TEAR      SKIN CANCER REMOVED     RT KNEE AND FOREHEAD   TONSILLECTOMY AND ADENOIDECTOMY     WISDOM TOOTH REMOVAL     Social History   Socioeconomic History   Marital status: Widowed    Spouse name: Not on file   Number of children: Not on file   Years of education: Not on file   Highest education level: Not on file  Occupational History   Not on file  Tobacco Use   Smoking status: Former    Packs/day: 2.00    Years: 25.00    Pack years: 50.00    Types: Cigarettes    Quit date: 12/16/1963    Years since quitting: 57.5   Smokeless tobacco: Never  Substance and Sexual Activity   Alcohol use: No    Alcohol/week: 0.0 standard drinks   Drug use: No   Sexual activity: Not on file  Other Topics Concern   Not on file  Social History Narrative   Not on file   Social Determinants of Health   Financial Resource Strain: Not on file  Food Insecurity: Not on file  Transportation Needs: Not on file  Physical Activity: Not on file  Stress: Not on file  Social Connections: Not on file   Family History  Problem Relation Age of Onset   Hypertension Mother    Asthma Mother    COPD Mother  Arthritis Mother    Colon cancer Father    Arthritis Sister    Prostate cancer Neg Hx    Bladder Cancer Neg Hx    Kidney cancer Neg Hx    Allergies  Allergen Reactions   Aspirin Anaphylaxis   Cefuroxime Axetil Swelling   Moxifloxacin Hcl In Nacl     Other reaction(s): Unknown   Oxycodone Nausea Only   Sulfamethoxazole-Trimethoprim     Other reaction(s): Unknown   Tramadol     Other reaction(s): Unknown   Prior to Admission medications   Medication Sig Start Date End Date Taking? Authorizing Provider  acetaminophen (TYLENOL) 650 MG CR tablet Take 1,300 mg by mouth 2 (two) times daily.   Yes [provider]  amLODipine (NORVASC) 5 MG tablet Take 5 mg by mouth daily. 05/01/21  Yes [provider]  DULoxetine (CYMBALTA) 60 MG capsule Take 60 mg by mouth daily. 10/18/14  Yes  [provider]  LORazepam (ATIVAN) 1 MG tablet Take 2 mg by mouth at bedtime.   Yes [provider]  montelukast (SINGULAIR) 10 MG tablet Take 10 mg by mouth daily. 10/18/14  Yes [provider]  omeprazole (PRILOSEC) 40 MG capsule Take 40 mg by mouth daily. 10/18/14  Yes [provider]  oxyCODONE (OXY IR/ROXICODONE) 5 MG immediate release tablet Take 2.5 mg by mouth every 6 (six) hours as needed for pain. 05/29/21  Yes [provider]  rOPINIRole (REQUIP) 0.5 MG tablet Take 0.5 mg by mouth 2 (two) times daily. 04/30/21  Yes [provider]  torsemide (DEMADEX) 20 MG tablet Take 20 mg by mouth daily. 07/06/14  Yes [provider]  budesonide (PULMICORT) 0.25 MG/2ML nebulizer solution Inhale 2 mLs into the lungs 2 (two) times daily as needed for wheezing or shortness of breath. 09/17/20   [provider]  cyanocobalamin (,VITAMIN B-12,) 1000 MCG/ML injection Inject 1,000 mcg into the muscle every 30 (thirty) days. 05/19/16   [provider]  dicyclomine (BENTYL) 10 MG capsule Take 10 mg by mouth 2 (two) times daily as needed. 01/24/20   [provider]  guaiFENesin (MUCINEX) 600 MG 12 hr tablet Take 600 mg by mouth 2 times daily at 12 noon and 4 pm.    [provider]  metoprolol succinate (TOPROL-XL) 25 MG 24 hr tablet Take 25 mg by mouth daily. 11/02/14 05/14/21  [provider]  PROAIR HFA 108 (90 Base) MCG/ACT inhaler USE 2 PUFFS EVERY 4 HOURS  AS NEEDED 11/03/18   Erin Fulling, MD  Vitamin D, Ergocalciferol, (DRISDOL) 50000 UNITS CAPS capsule Take 1 capsule by mouth once a week. 12/20/14   [provider]   DG Lumbar Spine 2-3 Views  Result Date: 06/03/2021 CLINICAL DATA:  Trauma, fall, pain EXAM: LUMBAR SPINE - 2-3 VIEW COMPARISON:  MRI lumbar spine done on 01/27/2017 FINDINGS: No recent fracture is seen. There is first-degree anterolisthesis at L4-L5 level. Degenerative changes are  noted with marked disc space narrowing, bony spurs and facet hypertrophy at L5-S1 level. Osteopenia is seen in bony structures. There are scattered arterial calcifications. Surgical clips are seen in gallbladder fossa. Marked degenerative changes are noted in right hip. IMPRESSION: No recent fracture is seen. Osteopenia. There is first-degree anterolisthesis at L4-L5 level. Degenerative changes are noted in lumbar spine, particularly severe at L5-S1 level. Degenerative changes are noted in right hip. Electronically Signed   By: Ernie Avena M.D.   On: 06/03/2021 16:17   DG Pelvis 1-2 Views  Result Date: 06/03/2021 CLINICAL DATA:  Left leg pain after fall. EXAM: PELVIS - 1-2 VIEW COMPARISON:  None. FINDINGS: There is no evidence of pelvic fracture or diastasis. No pelvic bone lesions are seen. Severe degenerative changes seen involving the right hip joint. Mild degenerative changes are seen involving the left hip. IMPRESSION: No acute abnormality seen. Electronically Signed   By: Lupita Raider M.D.   On: 06/03/2021 16:14   CT Hip Left Wo Contrast  Result Date: 06/04/2021 CLINICAL DATA:  Hip pain, stress fracture suspected, neg xray EXAM: CT OF THE LEFT HIP WITHOUT CONTRAST TECHNIQUE: Multidetector CT imaging of the left hip was performed according to the standard protocol. Multiplanar CT image reconstructions were also generated. COMPARISON:  Same day radiograph, CT 05/14/2021 FINDINGS: Bones/Joint/Cartilage There is an acute, mild displaced and impacted left transcervical/basicervical left femoral neck fracture with varus angulation. There is moderate left hip osteoarthritis. Small joint effusion. Ligaments Suboptimally assessed by CT. Muscles and Tendons Mild generalized loss of muscle bulk and moderate to severe gluteus minimus and medius muscle atrophy. No intramuscular collection. Soft tissues Superficial soft tissue swelling along the hip. No focal fluid collection. IMPRESSION: Acute,  displaced and mildly impacted left transcervical/basicervical left femoral neck fracture with varus angulation. Electronically Signed   By: Caprice Renshaw M.D.   On: 06/04/2021 09:54   DG Chest Port 1 View  Result Date: 06/04/2021 CLINICAL DATA:  Preop evaluation for hip fracture EXAM: PORTABLE CHEST 1 VIEW COMPARISON:  07/29/2013 FINDINGS: Transverse diameter of heart is slightly increased. There are no signs of pulmonary edema or focal pulmonary consolidation. There is no pleural effusion or pneumothorax. IMPRESSION: There are no signs of pulmonary edema or focal pulmonary consolidation. Electronically Signed   By: Ernie Avena M.D.   On: 06/04/2021 10:39   DG Knee Complete 4 Views Left  Result Date: 06/04/2021 CLINICAL DATA:  Fall and trauma to the left knee. EXAM: LEFT KNEE - COMPLETE 4+ VIEW COMPARISON:  None. FINDINGS: No acute fracture or dislocation. The bones are osteopenic. Severe arthritic changes with tricompartmental narrowing and spurring. Small suprapatellar effusion. The soft tissues are unremarkable. IMPRESSION: 1. No acute fracture or dislocation. 2. Severe arthritic changes. Electronically Signed   By: Elgie Collard M.D.   On: 06/04/2021 00:32    Positive ROS: All other systems have been reviewed and were otherwise negative with the exception of those mentioned in the HPI and as above.  Physical Exam: General:  Alert, no acute distress Psychiatric:  Patient is competent for consent with normal mood and affect   Cardiovascular:  No pedal edema Respiratory:  No wheezing, non-labored breathing GI:  Abdomen is soft and non-tender Skin:  No lesions in the area of chief complaint Neurologic:  Sensation intact distally Lymphatic:  No axillary or cervical lymphadenopathy  Orthopedic Exam:  Orthopedic examination is limited to the left hip and lower extremity.  The left hip is held in a flexed and externally rotated position as compared to the right.  Skin inspection around  the left hip is unremarkable.  No swelling, erythema, ecchymosis, abrasions, or other skin abnormalities are identified.  She has mild tenderness to palpation over the lateral aspect of the left hip.  She has more severe pain with any attempted active or passive motion of the left hip.  She is able to dorsiflex and plantarflex her toes and ankle.  Sensation is intact to light touch to all distributions.  She has good capillary refill to her left foot.  X-rays:  A recent CT scan of the pelvis and left hip is available for review and has been reviewed by myself.  This study demonstrates evidence of a varus displaced fracture of the left femoral neck.  Some degenerative changes of the left hip also are noted.  No lytic lesions or other acute bony abnormalities are identified.  Assessment: Displaced left femoral neck fracture.  Plan: The treatment options, including both surgical and nonsurgical choices, have been discussed in detail with the patient and her granddaughter who is at the bedside. They would like to proceed with surgical intervention to include a left hip hemiarthroplasty. The risks (including bleeding, infection, nerve and/or blood vessel injury, persistent or recurrent pain, loosening or failure of the components, leg length inequality, dislocation, need for further surgery, blood clots, strokes, heart attacks or arrhythmias, pneumonia, etc.) and benefits of the surgical procedure were discussed. The patient states her understanding and agrees to proceed.  She agrees to a blood transfusion if necessary. A formal written consent will be obtained by the nursing staff.  Thank you for asking me to participate in the care of this most delightful woman.  I will be happy to follow her with you.   Maryagnes Amos, MD  Beeper #:  [phone]  06/04/2021 4:39 PM

## 2021-06-04 NOTE — Transfer of Care (Signed)
Immediate Anesthesia Transfer of Care Note  Patient: Brittany Munoz  Procedure(s) Performed: ARTHROPLASTY BIPOLAR HIP (HEMIARTHROPLASTY) (Left: Hip)  Patient Location: PACU  Anesthesia Type:Spinal  Level of Consciousness: awake, alert  and patient cooperative  Airway & Oxygen Therapy: Patient Spontanous Breathing and Patient connected to face mask oxygen  Post-op Assessment: Report given to RN and Post -op Vital signs reviewed and stable  Post vital signs: Reviewed and stable  Last Vitals:  Vitals Value Taken Time  BP 177/74 06/04/21 1921  Temp    Pulse 94 06/04/21 1924  Resp 22 06/04/21 1924  SpO2 100 % 06/04/21 1924  Vitals shown include unvalidated device data.  Last Pain:  Vitals:   06/04/21 1539  TempSrc:   PainSc: 8          Complications: No notable events documented.

## 2021-06-04 NOTE — Op Note (Signed)
06/04/2021  7:15 PM  Patient:   Brittany Munoz  Pre-Op Diagnosis:   Displaced femoral neck fracture, left hip.  Post-Op Diagnosis:   Same.  Procedure:   Left hip bipolar hemiarthroplasty.  Surgeon:   Maryagnes Amos, MD  Assistant:   Horris Latino, PA-C  Anesthesia:   Spinal  Findings:   As above.  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  Complications:   None  EBL:   100 cc  Fluids:   800 cc crystalloid  UOP:   Incontinent  TT:   None  Drains:   None  Closure:   Staples  Implants:   Biomet system with a #11 cemented laterally offset reduced proximal profile fracture stem, a 48 mm outer diameter shell, a 28 mm head, and a -3 mm neck adapter.  Brief Clinical Note:   The patient is a 87 year old female who apparently fell onto her left side 3 weeks ago. She presented to the emergency room but x-rays were unremarkable. She was admitted for pain control for 2 days then sent back to her assisted living facility. However, she continued to have difficulty weightbearing on her left leg, even using a walker for balance and support, so she returned to the emergency room yesterday. Repeat x-rays were apparently inconclusive so she underwent a CT scan which confirmed a varus displaced left femoral neck fracture. The patient has been cleared medically and presents at this time for definitive management of the injury.  Procedure:   The patient was brought into the operating room. After adequate spinal anesthesia was obtained, the patient was repositioned in the right lateral decubitus position and secured using a lateral hip positioner. The left hip and lower extremity were prepped with ChloroPrep solution before being draped sterilely. Preoperative antibiotics were administered. A timeout was performed to verify the appropriate surgical site.    A standard posterior approach to the hip was made through an approximately 4-5 inch incision. The incision was carried  down through the subcutaneous tissues to expose the gluteal fascia and proximal end of the iliotibial band. These structures were split the length of the incision and the Charnley self-retaining hip retractor placed. The bursal tissues were swept posteriorly to expose the short external rotators. It was at this point that the patient was noted to have chronically torn and retracted gluteus medius and minimus tendon tears. Therefore, the decision was made to proceed with a bipolar prosthesis.   The anterior border of the piriformis tendon was identified and this plane developed down through the capsule to enter the joint.  Some residual fracture hematoma was encountered and suctioned. A flap of tissue was elevated off the posterior aspect of the femoral neck and greater trochanter and retracted posteriorly. This flap included the piriformis tendon, the short external rotators, and the posterior capsule. The femoral head was removed in its entirety, then taken to the back table where it was measured and found to be optimally replicated by a 48 mm head. The appropriate trial head was inserted and found to demonstrate an excellent suction fit.   Attention was directed to the femoral side. Given how low the femoral neck fracture was, there was no need for a freshening femoral neck cut. The piriformis fossa was debrided of soft tissues before the intramedullary canal was accessed through this point using a triple step reamer. A box osteotome was used to establish version before the canal was broached sequentially beginning with a #7 broach and  progressing to a #13 broach. This was left in place and several trial reductions performed. During these reductions, it was clear that the femoral stem was not going to be able to maintain sufficient position given the patient's osteoporotic bone. Therefore, the decision was made to proceed with cementing in the femoral stem.   The femoral canal was prepared for cementing by  irrigating it thoroughly with a long pulse lavage irrigating adapter. The femoral canal was sounded before the number for distal cement restrictor was selected and placed to the appropriate depth. The canal was packed with a Neo-Synephrine soaked vaginal sponge to help constrict the interosseous vessels.  Meanwhile, the cement was mixed on the back table. When it was ready, it was injected using a cement gun and pressurized. The permanent #11 reduced proximal profile fracture femoral stem with the distal centralizer was inserted with care taken to maintain the appropriate version, then held in place until the cement hardened. Meanwhile, the excess cement was removed using a freer elevator.   Once the cement had hardened, a repeat trial reduction was performed using both the -6 mm and -3 mm neck lengths with the 48 mm shell. The -3 mm neck length demonstrated excellent stability both in extension and external rotation as well as with flexion to 90 and internal rotation beyond 70. It also was stable in the position of sleep. The 48 mm outer diameter shell with the 28 mm head and -3 mm neck adapter construct was put together on the back table before being impacted onto the stem of the femoral component. The Morse taper locking mechanism was verified using manual distraction before the head was relocated and the hip placed through a range of motion with the findings as described above.  The wound was copiously irrigated with sterile saline solution via the jet lavage system before the peri-incisional and pericapsular tissues were injected with 30 cc of 0.5% Sensorcaine with epinephrine and 20 cc of Exparel diluted out to 60 cc with normal saline to help with postoperative analgesia. The posterior flap was reapproximated to the posterior aspect of the greater trochanter using #2 Tycron interrupted sutures placed through drill holes. The iliotibial band was reapproximated using #1 Vicryl interrupted sutures before  the gluteal fascia was closed using a running #1 Vicryl suture. At this point, 1 g of transexemic acid in 10 cc of normal saline was injected into the joint to help reduce postoperative bleeding. The subcutaneous tissues were closed in several layers using 2-0 Vicryl interrupted sutures before the skin was closed using staples. A sterile occlusive dressing was applied to the wound . The patient then was rolled back into the supine position on the hospital bed before being awakened and returned to the recovery room in satisfactory condition after tolerating the procedure well.

## 2021-06-04 NOTE — Anesthesia Procedure Notes (Signed)
Spinal  Patient location during procedure: OR Start time: 06/04/2021 5:04 PM End time: 06/04/2021 5:09 PM Reason for block: surgical anesthesia Staffing Performed: resident/CRNA  Anesthesiologist: Corinda Gubler, MD Resident/CRNA: Jaye Beagle, CRNA Preanesthetic Checklist Completed: patient identified, IV checked, site marked, risks and benefits discussed, surgical consent, monitors and equipment checked, pre-op evaluation and timeout performed Spinal Block Patient position: right lateral decubitus Prep: ChloraPrep Patient monitoring: heart rate, cardiac monitor, continuous pulse ox and blood pressure Approach: midline Location: L3-4 Injection technique: single-shot Needle Needle type: Quincke  Needle gauge: 22 G Needle length: 9 cm Assessment Sensory level: T4 Events: CSF return Additional Notes Clear csf obtained, no paresthesias, no heme.

## 2021-06-04 NOTE — ED Notes (Signed)
Ryan Charity fundraiser given report.

## 2021-06-04 NOTE — Anesthesia Preprocedure Evaluation (Addendum)
Anesthesia Evaluation  Patient identified by MRN, date of birth, ID band Patient awake    Reviewed: Allergy & Precautions, NPO status , Patient's Chart, lab work & pertinent test results  History of Anesthesia Complications Negative for: history of anesthetic complications  Airway Mallampati: III  TM Distance: >3 FB Neck ROM: Full    Dental  (+) Upper Dentures, Poor Dentition   Pulmonary shortness of breath and Long-Term Oxygen Therapy, asthma , COPD,  COPD inhaler and oxygen dependent, former smoker,  Pulmonary Fibrosis On 2.5L/min oxygen .  50.00 smoking pack-years. Quit 1965    Diffuse mild wheezing.   + wheezing      Cardiovascular Exercise Tolerance: Poor hypertension, Pt. on medications +CHF (Chronic combined systolic and diastolic congestive heart failure )  + dysrhythmias (Tachy-brady syndrome) + Valvular Problems/Murmurs MR  Rhythm:Regular Rate:Normal - Systolic murmurs EKG [id number]: Sinus tachycardia with Premature atrial complexes T wave abnormality, consider inferior ischemia  ECHO 04/2018: INTERPRETATION  MODERATE LV SYSTOLIC DYSFUNCTION (See above)  NORMAL RIGHT VENTRICULAR SYSTOLIC FUNCTION  MILD VALVULAR REGURGITATION (See above)  NO VALVULAR STENOSIS  Closest EF: 30% (Estimated)  Contraction: MOD GLOBAL DECREASE  Aortic: TRIVIAL AR  Mitral: MILD MR  Tricuspid: MILD TR  Dias.FxClass: (Grade 1) relaxation abnormal, E/A reversal   Patient says she sleeps relatively flat  No evidence of pedal edema or JVD   Neuro/Psych PSYCHIATRIC DISORDERS Anxiety Depression negative neurological ROS     GI/Hepatic negative GI ROS, Neg liver ROS,   Endo/Other  diabetes, Type 2  Renal/GU CRFRenal disease  negative genitourinary   Musculoskeletal  (+) Arthritis ,   Abdominal   Peds negative pediatric ROS (+)  Hematology negative hematology ROS (+)   Anesthesia Other Findings Closed left displaced hip  fracture likely happened about a week ago.  Patient does not recall falling..  Tachycardia and history of hypertension on metoprolol.  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  History of pulmonary fibrosis.  Continue nebulizers and inhalers.  Anxiety depression on Cymbalta  Chronic kidney disease stage IIIa and dehydration. Holding torsemide.  Type 2 diabetes mellitus with hemoglobin A1c of 6.0.  Diet controlled.  Reproductive/Obstetrics negative OB ROS                        Anesthesia Physical Anesthesia Plan  ASA: 4  Anesthesia Plan: Spinal   Post-op Pain Management:    Induction: Intravenous  PONV Risk Score and Plan: 2 and Ondansetron, Dexamethasone, Propofol infusion, TIVA and Treatment may vary due to age or medical condition  Airway Management Planned: Natural Airway  Additional Equipment: None  Intra-op Plan:   Post-operative Plan: Possible Post-op intubation/ventilation  Informed Consent: I have reviewed the patients History and Physical, chart, labs and discussed the procedure including the risks, benefits and alternatives for the proposed anesthesia with the patient or authorized representative who has indicated his/her understanding and acceptance.     Consent reviewed with POA  Plan Discussed with: CRNA and Surgeon  Anesthesia Plan Comments: (Discussed R/B/A of neuraxial anesthesia technique with patient and granddaughter at bedside: - rare risks of spinal/epidural hematoma, nerve damage, infection - Risk of PDPH - Risk of nausea and vomiting - Risk of conversion to general anesthesia and its associated risks, including sore throat, damage to lips/teeth/oropharynx, and rare risks such as cardiac and respiratory events. - Risk of allergic reactions  I discussed how both neuraxial vs GETA options are suboptimal for different reasons for this patient,  but that ultimately I believed neuraxial anesthesia would be the lesser of two  evils, allowing [place] to avoid intubation, and that control of patient's blood pressure and heart function would be more manageable than fighting atelactasis and stiff lungs under GA. They do understand that GETA is always our backup.  Patient and daughter voiced understanding.)        Anesthesia Quick Evaluation

## 2021-06-05 ENCOUNTER — Encounter: Payer: Self-pay | Admitting: Surgery

## 2021-06-05 DIAGNOSIS — E876 Hypokalemia: Secondary | ICD-10-CM | POA: Diagnosis not present

## 2021-06-05 DIAGNOSIS — D62 Acute posthemorrhagic anemia: Secondary | ICD-10-CM | POA: Diagnosis not present

## 2021-06-05 DIAGNOSIS — S72002A Fracture of unspecified part of neck of left femur, initial encounter for closed fracture: Secondary | ICD-10-CM | POA: Diagnosis not present

## 2021-06-05 LAB — CBC
HCT: 31 % — ABNORMAL LOW (ref 36.0–46.0)
Hemoglobin: 9.7 g/dL — ABNORMAL LOW (ref 12.0–15.0)
MCH: 25 pg — ABNORMAL LOW (ref 26.0–34.0)
MCHC: 31.3 g/dL (ref 30.0–36.0)
MCV: 79.9 fL — ABNORMAL LOW (ref 80.0–100.0)
Platelets: 266 10*3/uL (ref 150–400)
RBC: 3.88 MIL/uL (ref 3.87–5.11)
RDW: 15.1 % (ref 11.5–15.5)
WBC: 11.7 10*3/uL — ABNORMAL HIGH (ref 4.0–10.5)
nRBC: 0 % (ref 0.0–0.2)

## 2021-06-05 LAB — BASIC METABOLIC PANEL
Anion gap: 8 (ref 5–15)
BUN: 19 mg/dL (ref 8–23)
CO2: 28 mmol/L (ref 22–32)
Calcium: 8.5 mg/dL — ABNORMAL LOW (ref 8.9–10.3)
Chloride: 104 mmol/L (ref 98–111)
Creatinine, Ser: 0.84 mg/dL (ref 0.44–1.00)
GFR, Estimated: >60 mL/min (ref >60)
Glucose, Bld: 136 mg/dL — ABNORMAL HIGH (ref 70–99)
Potassium: 3 mmol/L — ABNORMAL LOW (ref 3.5–5.1)
Sodium: 140 mmol/L (ref 135–145)

## 2021-06-05 LAB — GLUCOSE, CAPILLARY
Glucose-Capillary: 116 mg/dL — ABNORMAL HIGH (ref 70–99)
Glucose-Capillary: 126 mg/dL — ABNORMAL HIGH (ref 70–99)
Glucose-Capillary: 114 mg/dL — ABNORMAL HIGH (ref 70–99)

## 2021-06-05 MED ORDER — POTASSIUM CHLORIDE 20 MEQ PO PACK
60.0000 meq | PACK | Freq: Once | ORAL | Status: AC
  Administered 2021-06-05: 60 meq via ORAL
  Filled 2021-06-05: qty 3

## 2021-06-05 MED ORDER — POTASSIUM CHLORIDE CRYS ER 20 MEQ PO TBCR
40.0000 meq | EXTENDED_RELEASE_TABLET | Freq: Two times a day (BID) | ORAL | Status: AC
  Administered 2021-06-05 (×2): 40 meq via ORAL
  Filled 2021-06-05 (×2): qty 2

## 2021-06-05 NOTE — Evaluation (Signed)
Physical Therapy Evaluation Patient Details Name: Brittany Munoz MRN: [id number] DOB: Sep 13, 1927 Today's Date: 06/05/2021  History of Present Illness  Brittany Munoz is a 89yoF who [facility] 06[facility]21 after 2+ weeks continued LLE pain and difficulty AMB s/p fall earlier in month. Pt here on 05/14/21 after a fall c subsequent LLE pain from knee to hip, imaging negative at that time. PMH: COPD on O2, pulmonary fibrosis, HTN, DM, osteoporosis, gout, depression, GAD, cardiomyopathy, resides at [facility], RW AMB at baseline. CT scan shows displaced Left hip fracture. Pt went to OR c Dr. Leron Croak for Lt hip hemiarthroplasty using "standard posterior approach". Per Dr. 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 Pt is WBAT LLE.  Clinical Impression  Pt admitted with above diagnosis. Pt currently with functional limitations due to the deficits listed below (see "PT Problem List"). Upon entry, pt in bed, awake and agreeable to participate. The pt is alert, pleasant, interactive, and able to provide info regarding prior level of function, both in tolerance and independence, however HOH does limit communication at times. Pt has adequate pain control in bed, but any attempted mobility with assistance results in severe pain response, guarding, grimacing. Pt requires modA and frequent breaks to achieve AA/ROM of legs. Patient's performance this date reveals decreased ability, independence, and tolerance in performing all basic mobility required for performance of activities of daily living. Pt requires additional DME, close physical assistance, and cues for safe participate in mobility. Pt will benefit from skilled PT intervention to increase independence and safety with basic mobility in preparation for discharge to the venue listed below.           Recommendations for follow up therapy are one component of a multi-disciplinary discharge planning process, led by the  attending physician.  Recommendations may be updated based on patient status, additional functional criteria and insurance authorization.  Follow Up Recommendations Skilled nursing-short term rehab (<3 hours/day)    Assistance Recommended at Discharge Intermittent Supervision/Assistance  Functional Status Assessment Patient has had a recent decline in their functional status and demonstrates the ability to make significant improvements in function in a reasonable and predictable amount of time.  Equipment Recommendations  None recommended by PT    Recommendations for Other Services       Precautions / Restrictions Precautions Precautions: Fall Restrictions Weight Bearing Restrictions: No      Mobility  Bed Mobility  Did not attempt due to pain control limitations and severe weakness. Would be dependent MaxA+2                   Transfers                        Ambulation/Gait                Stairs            Wheelchair Mobility    Modified Rankin (Stroke Patients Only)       Balance                                             Pertinent Vitals/Pain Pain Assessment: 0-10 Pain Score: 9  Pain Location: L knee, left hip    Home Living Family/patient expects to be discharged to:: Private residence Living Arrangements: Alone Available  Help at Discharge: Family Type of Home: Independent living facility ([facility])         Home Layout: One level Home Equipment: Rollator (4 wheels)      Prior Function               Mobility Comments: since fall, has essentially been bed bound except for SPT to [facility]       Hand Dominance   Dominant Hand: Right    Extremity/Trunk Assessment                Communication   Communication: [facility]  Cognition                                                General Comments      Exercises General Exercises - Lower Extremity Ankle Circles/Pumps:  AROM;Both;10 reps Short Arc Quad: AAROM;AROM;Both;10 reps;15 reps;Supine;Limitations Short Arc Quad Limitations: severe pain limitations, 15 on Rt, 10 on left Heel Slides: AAROM;Both;10 reps;Limitations Heel Slides Limitations: unable to tolerate due to pain Hip ABduction/ADduction: AAROM;Both;10 reps;Limitations Hip Abduction/Adduction Limitations: unable to tolerate due to pain   Assessment/Plan    PT Assessment Patient needs continued PT services  PT Problem List Decreased strength;Decreased mobility;Decreased activity tolerance;Pain;Decreased range of motion;Cardiopulmonary status limiting activity       PT Treatment Interventions Therapeutic activities;Gait training;Therapeutic exercise;Functional mobility training;Patient/family education    PT Goals (Current goals can be found in the Care Plan section)  Acute Rehab PT Goals Patient Stated Goal: to return to walking, decrease pain PT Goal Formulation: With patient Time For Goal Achievement: 06/19/21 Potential to Achieve Goals: Fair    Frequency 7X/week (will trial BID, but unclear if patient will tolerate))   Barriers to discharge Decreased caregiver support;Inaccessible home environment      Co-evaluation               AM-PAC PT "6 Clicks" Mobility  Outcome Measure Help needed turning from your back to your side while in a flat bed without using bedrails?: Total Help needed moving from lying on your back to sitting on the side of a flat bed without using bedrails?: Total Help needed moving to and from a bed to a chair (including a wheelchair)?: Total Help needed standing up from a chair using your arms (e.g., wheelchair or bedside chair)?: Total Help needed to walk in hospital room?: Total Help needed climbing 3-5 steps with a railing? : Total 6 Click Score: 6    End of Session Equipment Utilized During Treatment: Gait belt;Oxygen Activity Tolerance: Patient limited by pain Patient left: in bed;with SCD's  reapplied;with family/visitor present;with call bell/phone within reach Nurse Communication: Mobility status PT Visit Diagnosis: Unsteadiness on feet (R26.81);Muscle weakness (generalized) (M62.81);Difficulty in walking, not elsewhere classified (R26.2);History of falling (Z91.81);Pain    Time: 0920-0954 PT Time Calculation (min) (ACUTE ONLY): 34 min   Charges:   PT Evaluation $PT Eval Moderate Complexity: 1 Mod PT Treatments $Therapeutic Exercise: 8-22 mins       12:06 PM, 06/05/21 Rosamaria Lints, PT, DPT Physical Therapist - [facility]  [phone] (ASCOM)    Alizza Sacra C 06/05/2021, 12:03 PM

## 2021-06-05 NOTE — [place] FL2 (Signed)
[place] MEDICAID FL2 LEVEL OF CARE SCREENING TOOL     IDENTIFICATION  Patient Name: TALAYSHA FREEBERG Birthdate: Dec 08, 1927 Sex: female Admission Date (Current Location): 06/04/2021  [facility] and [place] Number:  Chiropodist and Address:  [facility], [address] [place], [place] [address]      Provider Number: [id number]  Attending Physician Name and Address:  Charise Killian, MD  Relative Name and Phone Number:  [name] [phone]    Current Level of Care: Hospital Recommended Level of Care: Skilled Nursing Facility Prior Approval Number:    Date Approved/Denied:   PASRR Number: [id number]  Discharge Plan: SNF    Current Diagnoses: Patient Active Problem List   Diagnosis Date Noted   Closed left hip fracture (HCC) 06/04/2021   Tachycardia    Chronic respiratory failure with hypoxia (HCC)    Stage 3a chronic kidney disease (HCC)    Type 2 diabetes mellitus without complication, without long-term current use of insulin (HCC)    Bilateral hip pain 05/14/2021   Hyperlipidemia, unspecified 12/24/2016   Osteoporosis 12/24/2016   Pulmonary fibrosis (HCC) 12/24/2016   Wrist fracture, left 12/24/2016   Tachy-brady syndrome (HCC) 11/18/2016   B12 deficiency 11/03/2016   Chronic constipation 08/29/2016   Dyspnea 03/14/2016   Abdominal pain, RLQ (right lower quadrant) 10/08/2015   Chronic hyperglycemia 10/08/2015   COPD, moderate (HCC) 01/10/2015   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) 01/10/2015   Anemia, unspecified 10/18/2013   End stage COPD (HCC) 10/18/2013   Essential hypertension 10/18/2013   Osteoarthrosis involving lower leg 10/18/2013   Vitamin D deficiency, unspecified 10/18/2013    Orientation RESPIRATION BLADDER Height & Weight     Self, Time, Situation, Place  O2 (2 liters) External catheter, Continent Weight: 74.8 kg Height:  5\' 8"  (172.7 cm)  BEHAVIORAL SYMPTOMS/MOOD NEUROLOGICAL  BOWEL NUTRITION STATUS      Continent Diet (carb modified)  AMBULATORY STATUS COMMUNICATION OF NEEDS Skin   Extensive Assist Verbally Normal                       Personal Care Assistance Level of Assistance  Bathing, Feeding, Dressing Bathing Assistance: Limited assistance Feeding assistance: Independent Dressing Assistance: Limited assistance     Functional Limitations Info             SPECIAL CARE FACTORS FREQUENCY  PT (By licensed PT), OT (By licensed OT)     PT Frequency: 5 times per week OT Frequency: 5 times per week            Contractures Contractures Info: Not present    Additional Factors Info  Code Status, Allergies Code Status Info: Full code Allergies Info: Aspirin, Cefuroxime Axetil, Moxifloxacin Hcl In Nacl, Oxycodone, Sulfamethoxazole-trimethoprim, Tramadol           Current Medications (06/05/2021):  This is the current hospital active medication list Current Facility-Administered Medications  Medication Dose Route Frequency Provider Last Rate Last Admin   0.9 %  sodium chloride infusion   Intravenous Continuous Poggi, 13/09/2020, MD 40 mL/hr at 06/04/21 2207 New Bag at 06/04/21 2207   acetaminophen (TYLENOL) tablet 1,000 mg  1,000 mg Oral Q6H Poggi, 2208, MD   1,000 mg at 06/05/21 0547   acetaminophen (TYLENOL) tablet 325-650 mg  325-650 mg Oral Q6H PRN Poggi, 13/02/22, MD       albuterol (PROVENTIL) (2.5 MG/3ML) 0.083% nebulizer solution 3 mL  3 mL Inhalation Q6H  PRN Poggi, Marshall Cork, MD       amLODipine (NORVASC) tablet 5 mg  5 mg Oral Daily Poggi, Marshall Cork, MD       bisacodyl (DULCOLAX) suppository 10 mg  10 mg Rectal Daily PRN Poggi, Marshall Cork, MD       budesonide (PULMICORT) nebulizer solution 0.25 mg  2 mL Inhalation BID Poggi, Marshall Cork, MD   0.25 mg at 06/05/21 0804   clindamycin (CLEOCIN) IVPB 900 mg  900 mg Intravenous Once Poggi, Marshall Cork, MD       dicyclomine (BENTYL) capsule 10 mg  10 mg Oral BID PRN Poggi, Marshall Cork, MD       diphenhydrAMINE  (BENADRYL) 12.5 MG/5ML elixir 12.5-25 mg  12.5-25 mg Oral Q4H PRN Poggi, Marshall Cork, MD       docusate sodium (COLACE) capsule 100 mg  100 mg Oral BID Poggi, Marshall Cork, MD   100 mg at 06/05/21 0805   DULoxetine (CYMBALTA) DR capsule 60 mg  60 mg Oral Daily Poggi, Marshall Cork, MD   60 mg at 06/05/21 0805   enoxaparin (LOVENOX) injection 40 mg  40 mg Subcutaneous Q24H Poggi, Marshall Cork, MD   40 mg at 06/05/21 0804   guaiFENesin (MUCINEX) 12 hr tablet 600 mg  600 mg Oral q12n4p Poggi, Marshall Cork, MD   600 mg at 06/04/21 1424   magnesium hydroxide (MILK OF MAGNESIA) suspension 30 mL  30 mL Oral Daily PRN Poggi, Marshall Cork, MD   30 mL at 06/05/21 0804   metoCLOPramide (REGLAN) tablet 5-10 mg  5-10 mg Oral Q8H PRN Poggi, Marshall Cork, MD       Or   metoCLOPramide (REGLAN) injection 5-10 mg  5-10 mg Intravenous Q8H PRN Poggi, Marshall Cork, MD       metoprolol succinate (TOPROL-XL) 24 hr tablet 25 mg  25 mg Oral Daily Poggi, Marshall Cork, MD   25 mg at 06/05/21 0804   montelukast (SINGULAIR) tablet 10 mg  10 mg Oral QHS Poggi, Marshall Cork, MD   10 mg at 06/04/21 2205   morphine 2 MG/ML injection 2 mg  2 mg Intravenous Q3H PRN Poggi, Marshall Cork, MD   2 mg at 06/04/21 1428   ondansetron (ZOFRAN) tablet 4 mg  4 mg Oral Q6H PRN Poggi, Marshall Cork, MD   4 mg at 06/05/21 0804   Or   ondansetron (ZOFRAN) injection 4 mg  4 mg Intravenous Q6H PRN Poggi, Marshall Cork, MD       oxyCODONE (Oxy IR/ROXICODONE) immediate release tablet 10-15 mg  10-15 mg Oral Q4H PRN Poggi, Marshall Cork, MD   10 mg at 06/05/21 [id number]   oxyCODONE (Oxy IR/ROXICODONE) immediate release tablet 2.5 mg  2.5 mg Oral Q6H PRN Poggi, Marshall Cork, MD   2.5 mg at 06/04/21 1245   pantoprazole (PROTONIX) EC tablet 40 mg  40 mg Oral Daily Poggi, Marshall Cork, MD   40 mg at 06/05/21 0804   potassium chloride SA (KLOR-CON) CR tablet 40 mEq  40 mEq Oral BID Wyvonnia Dusky, MD       rOPINIRole (REQUIP) tablet 0.5 mg  0.5 mg Oral TID Corky Mull, MD   0.5 mg at 06/05/21 0804   sodium phosphate (FLEET) 7-19 GM/118ML enema 1  enema  1 enema Rectal Once PRN Poggi, Marshall Cork, MD       torsemide [facility]) tablet 20 mg  20 mg Oral Daily Poggi, Marshall Cork, MD   20 mg at 06/05/21  0804     Discharge Medications: Please see discharge summary for a list of discharge medications.  Relevant Imaging Results:  Relevant Lab Results:   Additional Information SS# [id number]  Conception Oms, RN

## 2021-06-05 NOTE — Progress Notes (Signed)
Occupational Therapy Evaluation Patient Details Name: Brittany Munoz MRN: [id number] DOB: 09-27-1927 Today's Date: 06/05/2021   History of Present Illness Brittany Munoz is a 89yoF who comes to [facility] 06/03/21 after 2+ weeks continued LLE pain and difficulty AMB s/p fall earlier in month. Pt here on 05/14/21 after a fall c subsequent LLE pain from knee to hip, imaging negative at that time. PMH: COPD on O2, pulmonary fibrosis, HTN, DM, osteoporosis, gout, depression, GAD, cardiomyopathy, resides at [facility] IL[facility], RW AMB at baseline. CT scan shows displaced Left hip fracture. Pt went to OR c Dr. Leron Croak for Lt hip hemiarthroplasty using "standard posterior approach". Per Dr. 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 Pt is WBAT LLE.   Clinical Impression   [name] was seen for OT evaluation this date. Prior to previous hospital admission on 05/14/2021 , pt was independent with assistive devices. Pt unreliable historian, unable to state most recent level of function. Pt lives in independent living facility. Currently pt demonstrates impairments as described below (See OT problem list) which functionally limit her ability to perform ADL/self-care tasks. Pt currently requires SETUP + SUP grooming- toothbrushing, seated EOB ~ 5 min. Pt limited by pain, needing MAX + 2 for lateral scoot. Pt would benefit from skilled OT services to address noted impairments and functional limitations (see below for any additional details) in order to maximize safety and independence while minimizing falls risk and caregiver burden. Upon hospital discharge, recommend STR to maximize pt safety and return to PLOF.        Recommendations for follow up therapy are one component of a multi-disciplinary discharge planning process, led by the attending physician.  Recommendations may be updated based on patient status, additional functional criteria and insurance authorization.   Follow Up  Recommendations  Skilled nursing-short term rehab (<3 hours/day)    Assistance Recommended at Discharge Intermittent Supervision/Assistance  Functional Status Assessment  Patient has had a recent decline in their functional status and demonstrates the ability to make significant improvements in function in a reasonable and predictable amount of time.  Equipment Recommendations  BSC    Recommendations for Other Services       Precautions / Restrictions Precautions Precautions: Fall;Posterior Hip  Restrictions Weight Bearing Restrictions: Yes LLE Weight Bearing: Weight bearing as tolerated      Mobility Bed Mobility Overal bed mobility: Needs Assistance Bed Mobility: Sit to Supine       Sit to supine: Mod assist;+2 for physical assistance   General bed mobility comments: Pt recieved sitting EOB from PT.    Transfers Overall transfer level: Needs assistance   Transfers: Lateral/Scoot Transfers            Lateral/Scoot Transfers: Max assist;+2 physical assistance        Balance Overall balance assessment: Needs assistance Sitting-balance support: No upper extremity supported;Feet supported Sitting balance-Leahy Scale: Good                                    ADL either performed or assessed with clinical judgement   ADL Overall ADL's : Needs assistance/impaired                                       General ADL Comments: Pt SETUP + SUP grooming- toothbrushing, seated EOB.  Pertinent Vitals/Pain Pain Assessment: 0-10 Pain Score: 8  Pain Location: L knee, left hip at EOB Pain Descriptors / Indicators: Aching;Discomfort;Guarding Pain Intervention(s): Limited activity within patient's tolerance;Premedicated before session     Hand Dominance Right   Extremity/Trunk Assessment Upper Extremity Assessment Upper Extremity Assessment: Overall WFL for tasks assessed   Lower Extremity Assessment Lower Extremity Assessment:  LLE deficits/detail LLE Deficits / Details: s/p hemiarthoplasty       Communication Communication Communication: HOH   Cognition Arousal/Alertness: Awake/alert Behavior During Therapy: WFL for tasks assessed/performed Overall Cognitive Status: Impaired/Different from baseline Area of Impairment: Orientation;Memory                 Orientation Level: Place;Situation   Memory: Decreased recall of precautions              General Comments       Exercises Exercises: Other exercises  Other Exercises Other Exercises: Pt educated re: OT role, spirometer use, ECS, importance of movement to maintain functional mobility Other Exercises: sitting EOB, UB grooming - toothbrushing, spirometer use, lateral scoot x3   Shoulder Instructions      Home Living Family/patient expects to be discharged to:: Private residence Living Arrangements: Alone Available Help at Discharge: Family Type of Home: Independent living facility       Home Layout: One level                          Prior Functioning/Environment Prior Level of Function : Independent/Modified Independent;Other (comment)             Mobility Comments: Per chart, pt mobile w/ RW ~3 weeks ago. Unclear if pt is mobilizing at ILF ADLs Comments: Per chart, pt able to complete ADLs w/ assist device ~ 3 weeks ago. Unclear ADL ability, previous 3 weeks.        OT Problem List: Decreased strength;Decreased activity tolerance;Decreased safety awareness;Decreased knowledge of precautions;Decreased knowledge of use of DME or AE      OT Treatment/Interventions: Self-care/ADL training;Therapeutic exercise;Energy conservation;DME and/or AE instruction;Therapeutic activities    OT Goals(Current goals can be found in the care plan section) Acute Rehab OT Goals Patient Stated Goal: to feel less pain OT Goal Formulation: With patient Time For Goal Achievement: 06/19/21 Potential to Achieve Goals: Good ADL  Goals Pt Will Perform Grooming: standing;with mod assist Pt Will Perform Lower Body Dressing: sit to/from stand;with mod assist;with adaptive equipment Pt Will Transfer to Toilet: with mod assist;ambulating;bedside commode  OT Frequency: Min 2X/week   Barriers to D/C:            Co-evaluation              AM-PAC OT "6 Clicks" Daily Activity     Outcome Measure Help from another person eating meals?: None Help from another person taking care of personal grooming?: A Little Help from another person toileting, which includes using toliet, bedpan, or urinal?: A Lot Help from another person bathing (including washing, rinsing, drying)?: A Lot Help from another person to put on and taking off regular upper body clothing?: A Little Help from another person to put on and taking off regular lower body clothing?: A Lot 6 Click Score: 16   End of Session    Activity Tolerance: Patient limited by pain;Patient tolerated treatment well Patient left: in bed;with call bell/phone within reach;with bed alarm set  OT Visit Diagnosis: Muscle weakness (generalized) (M62.81);History of falling (Z91.81);Unsteadiness on feet (R26.81);Other abnormalities of gait  and mobility (R26.89)                Time: 0600-4599 OT Time Calculation (min): 15 min Charges:  OT General Charges $OT Visit: 1 Visit OT Evaluation $OT Eval Low Complexity: 1 Low  Teressa Senter 06/05/2021, 4:07 PM

## 2021-06-05 NOTE — Progress Notes (Addendum)
PROGRESS NOTE    Brittany Munoz  YQM:[id number] DOB: 1928-06-26 DOA: 06/04/2021 PCP: Mick Sell, MD   Assessment & Plan:   Active Problems:   End stage COPD (HCC)   Essential hypertension   Closed left hip fracture (HCC)   Closed left displaced hip fracture: s/p left hip bipolar hemiarthroplasty. PT recs SNF. Oxycodone, morphine prn for pain   Acute blood loss anemia: likely secondary to recent surg. No need for a transfusion currently   Leukocytosis: likely reactive. Will continue to monitor   Hypokalemia: KCl repleated.  HTN: continue on metoproolol  COPD: end stage. Continue on supplemental oxygen, chronically on  2.5L [place]. Hx of pulmonary fibrosis. Continue on bronchodilators  Chronic hypoxic respiratory failure: continue on supplemental oxygen   Depression: severity unknown. Continue on home dose of cymbalta  CKDIIIa: Cr is labile. Avoid nephrotoxic meds   DM2: well controlled, HbA1c 6.0. Diet controlled  DVT prophylaxis: lovenox  Code Status: full  Family Communication: discussed pt's care w/ pt's family at bedside and answered their questions  Disposition Plan: likely d/c to SNF   Level of care: Med-Surg  Status is: Inpatient  Remains inpatient appropriate because: severity of illness      Consultants:  Ortho surg   Procedures:  Antimicrobials:   Subjective: Pt c/o weakness   Objective: Vitals:   06/04/21 2034 06/04/21 2250 06/05/21 0449 06/05/21 0725  BP: 137/63 109/62 98/63 (!) 101/59  Pulse: 93 85 83 67  Resp: 20 18 20 16   Temp: 98.3 F (36.8 C) 98.9 F (37.2 C) 98.3 F (36.8 C) 97.9 F (36.6 C)  TempSrc: Oral Oral    SpO2: 95% 93% 94% 95%  Weight:      Height:        Intake/Output Summary (Last 24 hours) at 06/05/2021 0818 Last data filed at 06/04/2021 2010 Gross per 24 hour  Intake 1545 ml  Output 150 ml  Net 1395 ml   Filed Weights   06/03/21 1459  Weight: 74.8 kg    Examination:  General exam: Appears calm  and comfortable  Respiratory system: Clear to auscultation. Respiratory effort normal. Cardiovascular system: S1 & S2 +. No rubs, gallops or clicks.  Gastrointestinal system: Abdomen is nondistended, soft and nontender. Normal bowel sounds heard. Central nervous system: Alert and oriented. Moves all extremities  Psychiatry: Judgement and insight appear normal. Flat mood and affect     Data Reviewed: I have personally reviewed following labs and imaging studies  CBC: Recent Labs  Lab 06/04/21 0856 06/05/21 0514  WBC 10.2 11.7*  HGB 12.2 9.7*  HCT 40.1 31.0*  MCV 78.5* 79.9*  PLT 397 266   Basic Metabolic Panel: Recent Labs  Lab 06/04/21 0856 06/05/21 0514  NA 138 140  K 3.4* 3.0*  CL 98 104  CO2 28 28  GLUCOSE 128* 136*  BUN 27* 19  CREATININE 1.21* 0.84  CALCIUM 9.5 8.5*   GFR: Estimated Creatinine Clearance: 44 mL/min (by C-G formula based on SCr of 0.84 mg/dL). Liver Function Tests: Recent Labs  Lab 06/04/21 0856  AST 18  ALT 11  ALKPHOS 76  BILITOT 0.9  PROT 7.7  ALBUMIN 3.9   No results for input(s): LIPASE, AMYLASE in the last 168 hours. No results for input(s): AMMONIA in the last 168 hours. Coagulation Profile: No results for input(s): INR, PROTIME in the last 168 hours. Cardiac Enzymes: No results for input(s): CKTOTAL, CKMB, CKMBINDEX, TROPONINI in the last 168 hours. BNP (last 3 results)  No results for input(s): PROBNP in the last 8760 hours. HbA1C: No results for input(s): HGBA1C in the last 72 hours. CBG: Recent Labs  Lab 06/04/21 1922 06/04/21 2119 06/05/21 0727  GLUCAP 112* 128* 116*   Lipid Profile: No results for input(s): CHOL, HDL, LDLCALC, TRIG, CHOLHDL, LDLDIRECT in the last 72 hours. Thyroid Function Tests: No results for input(s): TSH, T4TOTAL, FREET4, T3FREE, THYROIDAB in the last 72 hours. Anemia Panel: No results for input(s): VITAMINB12, FOLATE, FERRITIN, TIBC, IRON, RETICCTPCT in the last 72 hours. Sepsis Labs: No  results for input(s): PROCALCITON, LATICACIDVEN in the last 168 hours.  Recent Results (from the past 240 hour(s))  Resp Panel by RT-PCR (Flu A&B, Covid) Nasopharyngeal Swab     Status: None   Collection Time: 06/04/21 10:20 AM   Specimen: Nasopharyngeal Swab; Nasopharyngeal(NP) swabs in vial transport medium  Result Value Ref Range Status   SARS Coronavirus 2 by RT PCR NEGATIVE NEGATIVE Final    Comment: (NOTE) SARS-CoV-2 target nucleic acids are NOT DETECTED.  The SARS-CoV-2 RNA is generally detectable in upper respiratory specimens during the acute phase of infection. The lowest concentration of SARS-CoV-2 viral copies this assay can detect is 138 copies/mL. A negative result does not preclude SARS-Cov-2 infection and should not be used as the sole basis for treatment or other patient management decisions. A negative result may occur with  improper specimen collection/handling, submission of specimen other than nasopharyngeal swab, presence of viral mutation(s) within the areas targeted by this assay, and inadequate number of viral copies(<138 copies/mL). A negative result must be combined with clinical observations, patient history, and epidemiological information. The expected result is Negative.  Fact Sheet for Patients:  EntrepreneurPulse.com.au  Fact Sheet for Healthcare Providers:  IncredibleEmployment.be  This test is no t yet approved or cleared by the [place] FDA and  has been authorized for detection and/or diagnosis of SARS-CoV-2 by FDA under an Emergency Use Authorization (EUA). This EUA will remain  in effect (meaning this test can be used) for the duration of the COVID-19 declaration under Section 564(b)(1) of the Act, 21 U.S.C.section 360bbb-3(b)(1), unless the authorization is terminated  or revoked sooner.       Influenza A by PCR NEGATIVE NEGATIVE Final   Influenza B by PCR NEGATIVE NEGATIVE Final    Comment:  (NOTE) The Xpert Xpress SARS-CoV-2/FLU/RSV plus assay is intended as an aid in the diagnosis of influenza from Nasopharyngeal swab specimens and should not be used as a sole basis for treatment. Nasal washings and aspirates are unacceptable for Xpert Xpress SARS-CoV-2/FLU/RSV testing.  Fact Sheet for Patients: EntrepreneurPulse.com.au  Fact Sheet for Healthcare Providers: IncredibleEmployment.be  This test is not yet approved or cleared by the [place] FDA and has been authorized for detection and/or diagnosis of SARS-CoV-2 by FDA under an Emergency Use Authorization (EUA). This EUA will remain in effect (meaning this test can be used) for the duration of the COVID-19 declaration under Section 564(b)(1) of the Act, 21 U.S.C. section 360bbb-3(b)(1), unless the authorization is terminated or revoked.  Performed at [facility], [address] [place], [place] [address]   Surgical pcr screen     Status: None   Collection Time: 06/04/21  1:50 PM   Specimen: Nasal Mucosa; Nasal Swab  Result Value Ref Range Status   MRSA, PCR NEGATIVE NEGATIVE Final   Staphylococcus aureus NEGATIVE NEGATIVE Final    Comment: (NOTE) The Xpert SA Assay (FDA approved for NASAL specimens in patients 22 years of  age and older), is one component of a comprehensive surveillance program. It is not intended to diagnose infection nor to guide or monitor treatment. Performed at [facility], [address], [place], [place] [address]          Radiology Studies: DG Lumbar Spine 2-3 Views  Result Date: 06/03/2021 CLINICAL DATA:  Trauma, fall, pain EXAM: LUMBAR SPINE - 2-3 VIEW COMPARISON:  MRI lumbar spine done on 01/27/2017 FINDINGS: No recent fracture is seen. There is first-degree anterolisthesis at L4-L5 level. Degenerative changes are noted with marked disc space narrowing, bony spurs and facet hypertrophy at L5-S1 level. Osteopenia is  seen in bony structures. There are scattered arterial calcifications. Surgical clips are seen in gallbladder fossa. Marked degenerative changes are noted in right hip. IMPRESSION: No recent fracture is seen. Osteopenia. There is first-degree anterolisthesis at L4-L5 level. Degenerative changes are noted in lumbar spine, particularly severe at L5-S1 level. Degenerative changes are noted in right hip. Electronically Signed   By: Elmer Picker M.D.   On: 06/03/2021 16:17   DG Pelvis 1-2 Views  Result Date: 06/03/2021 CLINICAL DATA:  Left leg pain after fall. EXAM: PELVIS - 1-2 VIEW COMPARISON:  None. FINDINGS: There is no evidence of pelvic fracture or diastasis. No pelvic bone lesions are seen. Severe degenerative changes seen involving the right hip joint. Mild degenerative changes are seen involving the left hip. IMPRESSION: No acute abnormality seen. Electronically Signed   By: Marijo Conception M.D.   On: 06/03/2021 16:14   CT Hip Left Wo Contrast  Result Date: 06/04/2021 CLINICAL DATA:  Hip pain, stress fracture suspected, neg xray EXAM: CT OF THE LEFT HIP WITHOUT CONTRAST TECHNIQUE: Multidetector CT imaging of the left hip was performed according to the standard protocol. Multiplanar CT image reconstructions were also generated. COMPARISON:  Same day radiograph, CT 05/14/2021 FINDINGS: Bones/Joint/Cartilage There is an acute, mild displaced and impacted left transcervical/basicervical left femoral neck fracture with varus angulation. There is moderate left hip osteoarthritis. Small joint effusion. Ligaments Suboptimally assessed by CT. Muscles and Tendons Mild generalized loss of muscle bulk and moderate to severe gluteus minimus and medius muscle atrophy. No intramuscular collection. Soft tissues Superficial soft tissue swelling along the hip. No focal fluid collection. IMPRESSION: Acute, displaced and mildly impacted left transcervical/basicervical left femoral neck fracture with varus angulation.  Electronically Signed   By: Maurine Simmering M.D.   On: 06/04/2021 09:54   DG Chest Port 1 View  Result Date: 06/04/2021 CLINICAL DATA:  Preop evaluation for hip fracture EXAM: PORTABLE CHEST 1 VIEW COMPARISON:  07/29/2013 FINDINGS: Transverse diameter of heart is slightly increased. There are no signs of pulmonary edema or focal pulmonary consolidation. There is no pleural effusion or pneumothorax. IMPRESSION: There are no signs of pulmonary edema or focal pulmonary consolidation. Electronically Signed   By: Elmer Picker M.D.   On: 06/04/2021 10:39   DG Knee Complete 4 Views Left  Result Date: 06/04/2021 CLINICAL DATA:  Fall and trauma to the left knee. EXAM: LEFT KNEE - COMPLETE 4+ VIEW COMPARISON:  None. FINDINGS: No acute fracture or dislocation. The bones are osteopenic. Severe arthritic changes with tricompartmental narrowing and spurring. Small suprapatellar effusion. The soft tissues are unremarkable. IMPRESSION: 1. No acute fracture or dislocation. 2. Severe arthritic changes. Electronically Signed   By: Anner Crete M.D.   On: 06/04/2021 00:32   DG HIP UNILAT W OR W/O PELVIS 2-3 VIEWS LEFT  Result Date: 06/04/2021 CLINICAL DATA:  Post left hip  hemiarthroplasty. EXAM: DG HIP (WITH OR WITHOUT PELVIS) 2-3V LEFT COMPARISON:  Preoperative imaging earlier today. FINDINGS: Left hip hemi-arthroplasty in expected alignment. No periprosthetic lucency or fracture. Recent postsurgical change includes air and edema in the soft tissues. Lateral skin staples in place. IMPRESSION: Left hip hemiarthroplasty without immediate postoperative complication. Electronically Signed   By: Keith Rake M.D.   On: 06/04/2021 19:52        Scheduled Meds:  acetaminophen  1,000 mg Oral Q6H   amLODipine  5 mg Oral Daily   budesonide  2 mL Inhalation BID   docusate sodium  100 mg Oral BID   DULoxetine  60 mg Oral Daily   enoxaparin (LOVENOX) injection  40 mg Subcutaneous Q24H   guaiFENesin  600 mg Oral  q12n4p   metoprolol succinate  25 mg Oral Daily   montelukast  10 mg Oral QHS   pantoprazole  40 mg Oral Daily   rOPINIRole  0.5 mg Oral TID   torsemide  20 mg Oral Daily   Continuous Infusions:  sodium chloride 40 mL/hr at 06/04/21 2207   clindamycin (CLEOCIN) IV 600 mg (06/05/21 0803)   clindamycin (CLEOCIN) IV       LOS: 1 day    Time spent: 33 mins     Wyvonnia Dusky, MD [facility] Pager [phone]  If 7PM-7AM, please contact night-coverage 06/05/2021, 8:18 AM

## 2021-06-05 NOTE — TOC Initial Note (Signed)
Transition of Care [facility]) - Initial/Assessment Note    Patient Details  Name: Brittany Munoz MRN: [id number] Date of Birth: 1928/03/31  Transition of Care [facility]) CM/SW Contact:    Marlowe Sax, RN Phone Number: 06/05/2021, 10:56 AM  Clinical Narrative:          Spoke with POA Brittany Munoz and the patient in the room, They are agreeable to go to [facility] SNF, stated that [place] lives in [place] and would be open to STR SNF [place], She has been to a STR previously several years ago with a knee surgery, Bedsearch sent, PASSr obtained FL2  completed               Patient Goals and CMS Choice        Expected Discharge Plan and Services                                                Prior Living Arrangements/Services                       Activities of Daily Living Home Assistive Devices/Equipment: Other (Comment) (PT From [facility]) ADL Screening (condition at time of admission) Patient's cognitive ability adequate to safely complete daily activities?: Yes Is the patient deaf or have difficulty hearing?: Yes Does the patient have difficulty seeing, even when wearing glasses/contacts?: No Does the patient have difficulty concentrating, remembering, or making decisions?: No Patient able to express need for assistance with ADLs?: No Does the patient have difficulty dressing or bathing?: Yes Independently performs ADLs?: No Communication: Independent Dressing (OT): Needs assistance Grooming: Needs assistance Feeding: Independent Bathing: Needs assistance Is this a change from baseline?: Change from baseline, expected to last <3 days Toileting: Needs assistance Walks in Home: Needs assistance Does the patient have difficulty walking or climbing stairs?: Yes Weakness of Legs: Both Weakness of Arms/Hands: None  Permission Sought/Granted                  Emotional Assessment              Admission diagnosis:  Fall [W19.XXXA] Closed  left hip fracture (HCC) [S72.002A] Closed fracture of left hip, initial encounter [facility]) [S72.002A] Patient Active Problem List   Diagnosis Date Noted   Closed left hip fracture (HCC) 06/04/2021   Tachycardia    Chronic respiratory failure with hypoxia (HCC)    Stage 3a chronic kidney disease (HCC)    Type 2 diabetes mellitus without complication, without long-term current use of insulin (HCC)    Bilateral hip pain 05/14/2021   Hyperlipidemia, unspecified 12/24/2016   Osteoporosis 12/24/2016   Pulmonary fibrosis (HCC) 12/24/2016   Wrist fracture, left 12/24/2016   Tachy-brady syndrome (HCC) 11/18/2016   B12 deficiency 11/03/2016   Chronic constipation 08/29/2016   Dyspnea 03/14/2016   Abdominal pain, RLQ (right lower quadrant) 10/08/2015   Chronic hyperglycemia 10/08/2015   COPD, moderate (HCC) 01/10/2015   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 [place] [address] Phone:  [phone] Fax: [phone]  [facility]  ([place]) - [place], [place] [facility] [address] [place] [address] [place] [address] Phone: [phone] Fax: [place], [place] [address] Melina Modena [address] [place] [address] Phone: [phone] Fax: [phone]     Social Determinants of Health (SDOH) Interventions    Readmission Risk Interventions No flowsheet data found.

## 2021-06-05 NOTE — Anesthesia Postprocedure Evaluation (Signed)
Anesthesia Post Note  Patient: Brittany Munoz  Procedure(s) Performed: ARTHROPLASTY BIPOLAR HIP (HEMIARTHROPLASTY) (Left: Hip)  Patient location during evaluation: Nursing Unit Anesthesia Type: Spinal Level of consciousness: awake Pain management: pain level controlled Vital Signs Assessment: post-procedure vital signs reviewed and stable Respiratory status: spontaneous breathing Cardiovascular status: stable Postop Assessment: no headache Anesthetic complications: no   No notable events documented.   Last Vitals:  Vitals:   06/05/21 0449 06/05/21 0725  BP: 98/63 (!) 101/59  Pulse: 83 67  Resp: 20 16  Temp: 36.8 C 36.6 C  SpO2: 94% 95%    Last Pain:  Vitals:   06/05/21 0850  TempSrc:   PainSc: 4                  Jaye Beagle

## 2021-06-05 NOTE — Progress Notes (Signed)
  Subjective: 1 Day Post-Op Procedure(s) (LRB): ARTHROPLASTY BIPOLAR HIP (HEMIARTHROPLASTY) (Left) Patient reports pain as mild.   Patient is well, and has had no acute complaints or problems PT and care management to assist with discharge planning, will likely need SNF upon discharge. Negative for chest pain and shortness of breath Fever: no Gastrointestinal:Negative for nausea and vomiting  Objective: Vital signs in last 24 hours: Temp:  [97.9 F (36.6 C)-100 F (37.8 C)] 97.9 F (36.6 C) (11/02 0725) Pulse Rate:  [67-119] 67 (11/02 0725) Resp:  [14-28] 16 (11/02 0725) BP: (98-185)/(59-106) 101/59 (11/02 0725) SpO2:  [92 %-100 %] 95 % (11/02 0725)  Intake/Output from previous day:  Intake/Output Summary (Last 24 hours) at 06/05/2021 0745 Last data filed at 06/04/2021 2010 Gross per 24 hour  Intake 1545 ml  Output 150 ml  Net 1395 ml    Intake/Output this shift: No intake/output data recorded.  Labs: Recent Labs    06/04/21 0856 06/05/21 0514  HGB 12.2 9.7*   Recent Labs    06/04/21 0856 06/05/21 0514  WBC 10.2 11.7*  RBC 5.11 3.88  HCT 40.1 31.0*  PLT 397 266   Recent Labs    06/04/21 0856 06/05/21 0514  NA 138 140  K 3.4* 3.0*  CL 98 104  CO2 28 28  BUN 27* 19  CREATININE 1.21* 0.84  GLUCOSE 128* 136*  CALCIUM 9.5 8.5*   No results for input(s): LABPT, INR in the last 72 hours.   EXAM General - Patient is Alert and Appropriate Extremity - ABD soft Neurovascular intact Dorsiflexion/Plantar flexion intact Incision: dressing C/D/I No cellulitis present Dressing/Incision - clean, dry, no drainage Motor Function - intact, moving foot and toes well on exam.  ABdomen soft with normal bowel sounds.  Past Medical History:  Diagnosis Date   Anxiety    Asthma    Cardiomyopathy (HCC)    Cataract    COPD (chronic obstructive pulmonary disease) (HCC)    Degenerative arthritis    Depression    Diabetes (HCC)    Eczema    Gout    Hemorrhoids     Hyperlipidemia    Hypertension    Nocturnal hypoxia    Osteoporosis    Pulmonary fibrosis (HCC)    Seasonal allergies    Shingles     Assessment/Plan: 1 Day Post-Op Procedure(s) (LRB): ARTHROPLASTY BIPOLAR HIP (HEMIARTHROPLASTY) (Left) Active Problems:   End stage COPD (HCC)   Essential hypertension   Closed left hip fracture (HCC)  Estimated body mass index is 25.09 kg/m as calculated from the following:   Height as of this encounter: 5\' 8"  (1.727 m).   Weight as of this encounter: 74.8 kg. Advance diet Up with therapy  Labs reviewed, K+ 3.0, [place] ordered for the patient. Hg 9.7, no drainage noted to the left hip. BP soft, hold BP meds and continue IVF this AM. Incentive spirometer given to the patient this morning, instructed on how to use. Up with therapy today, will likely need SNF upon discharge.  DVT Prophylaxis - Lovenox and Foot Pumps Weight-Bearing as tolerated to left leg  J. , PA-C [facility] Orthopaedic Surgery 06/05/2021, 7:45 AM

## 2021-06-05 NOTE — Progress Notes (Signed)
Physical Therapy Treatment Patient Details Name: AMINAH ZABAWA MRN: [id number] DOB: 1928-01-26 Today's Date: 06/05/2021   History of Present Illness Shanetha Bradham is a 89yoF who comes to [facility] 06/03/21 after 2+ weeks continued LLE pain and difficulty AMB s/p fall earlier in month. Pt here on 05/14/21 after a fall c subsequent LLE pain from knee to hip, imaging negative at that time. PMH: COPD on O2, pulmonary fibrosis, HTN, DM, osteoporosis, gout, depression, GAD, cardiomyopathy, resides at [facility] IL[facility], RW AMB at baseline. CT scan shows displaced Left hip fracture. Pt went to OR c Dr. Leron Croak for Lt hip hemiarthroplasty using "standard posterior approach". Per Dr. 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 Pt is WBAT LLE.    PT Comments    Author returning for BID treatment this date, still unclear if appropriate given pt tolerance. Pt awake upon entry, agreeable to session with limitations. Author coordinated timing with RN to best achieve pain control goals. Pt is somewhat confused this session, but Thereasa Parkin orients her to locations and situation. Pt agreeable to attempt going to EOB, she voices concerns about anxiety and pain when performed earlier in day for toileting. Pt is able to do so fairly well with minA and slow calming movements. At EOB pt balances well and adjusts body wth good planning and problem solving, no gross balance concerns. Pt performs a few exercises at EOB and is able to hold conversation. Session ended with pt at EOB to allow OT to evaluate.    Recommendations for follow up therapy are one component of a multi-disciplinary discharge planning process, led by the attending physician.  Recommendations may be updated based on patient status, additional functional criteria and insurance authorization.  Follow Up Recommendations  Skilled nursing-short term rehab (<3 hours/day)     Assistance Recommended at Discharge Intermittent  Supervision/Assistance  Equipment Recommendations  None recommended by PT    Recommendations for Other Services       Precautions / Restrictions Precautions Precautions: Fall;Posterior Hip Precaution Booklet Issued: No Precaution Comments: not cognitively appropriate Restrictions Weight Bearing Restrictions: No     Mobility  Bed Mobility Overal bed mobility: Needs Assistance Bed Mobility: Supine to Sit       Sit to supine: Min assist   General bed mobility comments: very slow due to anxiety regarding pain response; Author provides heavy assist to LLE; pt uses BU Eto help pull self to EOB; maxA lateral scoot to EOB usign chuck pad.    Transfers Overall transfer level:  (deferred, pt not interested due to severe pain earlier getting to [facility] with NSG)                      Ambulation/Gait                 Stairs             Wheelchair Mobility    Modified Rankin (Stroke Patients Only)       Balance Overall balance assessment: Needs assistance Sitting-balance support: No upper extremity supported;Feet supported Sitting balance-Leahy Scale: Good Sitting balance - Comments: appears more comfortable than exercises earlier.                                    Cognition Arousal/Alertness: Awake/alert   Overall Cognitive Status: Impaired/Different from baseline Area of Impairment: Orientation  Orientation Level: Disoriented to;Place;Time;Situation             General Comments: more confused compared to AM, believes to have been DC to home already.        Exercises Total Joint Exercises Long Arc Quad: AROM;Right;5 reps;Limitations Long [employer] Limitations: not agreeable to attempt 10 reps, despite being her nondominant leg, acutally does fairly well though. General Exercises - Lower Extremity Ankle Circles/Pumps: AROM;Both;10 reps Short Arc Quad: AAROM;AROM;Both;10 reps;15  reps;Supine;Limitations Short Arc Quad Limitations: severe pain limitations, 15 on Rt, 10 on left Heel Slides: AAROM;Both;10 reps;Limitations Heel Slides Limitations: unable to tolerate due to pain Hip ABduction/ADduction: AAROM;Both;10 reps;Limitations Hip Abduction/Adduction Limitations: unable to tolerate due to pain    General Comments        Pertinent Vitals/Pain Pain Assessment: 0-10 Pain Score: 8  Pain Location: L knee, left hip at EOB Pain Intervention(s): Limited activity within patient's tolerance;Monitored during session;Premedicated before session    Home Living Family/patient expects to be discharged to:: Private residence Living Arrangements: Alone Available Help at Discharge: Family Type of Home: Independent living facility ([facility])         Home Layout: One level Home Equipment: Rollator (4 wheels)      Prior Function            PT Goals (current goals can now be found in the care plan section) Acute Rehab PT Goals Patient Stated Goal: to return to walking, decrease pain PT Goal Formulation: With patient Time For Goal Achievement: 06/19/21 Potential to Achieve Goals: Fair Progress towards PT goals: Progressing toward goals    Frequency    7X/week (c BID trial to determine tolerance)      PT Plan Current plan remains appropriate    Co-evaluation              AM-PAC PT "6 Clicks" Mobility   Outcome Measure  Help needed turning from your back to your side while in a flat bed without using bedrails?: Total Help needed moving from lying on your back to sitting on the side of a flat bed without using bedrails?: Total Help needed moving to and from a bed to a chair (including a wheelchair)?: Total Help needed standing up from a chair using your arms (e.g., wheelchair or bedside chair)?: Total Help needed to walk in hospital room?: Total Help needed climbing 3-5 steps with a railing? : Total 6 Click Score: 6    End of Session  Equipment Utilized During Treatment: Oxygen Activity Tolerance: Patient limited by pain Patient left: in bed;with nursing/sitter in room;Other (comment) (EOB with occupational therapy at bedside) Nurse Communication: Mobility status PT Visit Diagnosis: Unsteadiness on feet (R26.81);Muscle weakness (generalized) (M62.81);Difficulty in walking, not elsewhere classified (R26.2);History of falling (Z91.81);Pain     Time: 8502-7741 PT Time Calculation (min) (ACUTE ONLY): 14 min  Charges:  $Therapeutic Exercise: 8-22 mins $Therapeutic Activity: 8-22 mins                    2:45 PM, 06/05/21 Rosamaria Lints, PT, DPT Physical Therapist - [facility]  [phone] (ASCOM)    Khori Underberg C 06/05/2021, 2:42 PM

## 2021-06-06 DIAGNOSIS — E875 Hyperkalemia: Secondary | ICD-10-CM | POA: Diagnosis not present

## 2021-06-06 DIAGNOSIS — D62 Acute posthemorrhagic anemia: Secondary | ICD-10-CM | POA: Diagnosis not present

## 2021-06-06 DIAGNOSIS — S72002A Fracture of unspecified part of neck of left femur, initial encounter for closed fracture: Secondary | ICD-10-CM | POA: Diagnosis not present

## 2021-06-06 LAB — CBC
HCT: 30.7 % — ABNORMAL LOW (ref 36.0–46.0)
Hemoglobin: 9 g/dL — ABNORMAL LOW (ref 12.0–15.0)
MCH: 23.8 pg — ABNORMAL LOW (ref 26.0–34.0)
MCHC: 29.3 g/dL — ABNORMAL LOW (ref 30.0–36.0)
MCV: 81.2 fL (ref 80.0–100.0)
Platelets: 247 10*3/uL (ref 150–400)
RBC: 3.78 MIL/uL — ABNORMAL LOW (ref 3.87–5.11)
RDW: 15.6 % — ABNORMAL HIGH (ref 11.5–15.5)
WBC: 11.3 10*3/uL — ABNORMAL HIGH (ref 4.0–10.5)
nRBC: 0 % (ref 0.0–0.2)

## 2021-06-06 LAB — SURGICAL PATHOLOGY

## 2021-06-06 LAB — GLUCOSE, CAPILLARY
Glucose-Capillary: 84 mg/dL (ref 70–99)
Glucose-Capillary: 97 mg/dL (ref 70–99)

## 2021-06-06 LAB — BASIC METABOLIC PANEL
Anion gap: 9 (ref 5–15)
BUN: 34 mg/dL — ABNORMAL HIGH (ref 8–23)
CO2: 25 mmol/L (ref 22–32)
Calcium: 8.8 mg/dL — ABNORMAL LOW (ref 8.9–10.3)
Chloride: 106 mmol/L (ref 98–111)
Creatinine, Ser: 1.57 mg/dL — ABNORMAL HIGH (ref 0.44–1.00)
GFR, Estimated: 31 mL/min — ABNORMAL LOW (ref >60)
Glucose, Bld: 85 mg/dL (ref 70–99)
Potassium: 5.6 mmol/L — ABNORMAL HIGH (ref 3.5–5.1)
Sodium: 140 mmol/L (ref 135–145)

## 2021-06-06 LAB — IRON AND TIBC
Iron: 13 ug/dL — ABNORMAL LOW (ref 28–170)
Saturation Ratios: 4 % — ABNORMAL LOW (ref 10.4–31.8)
TIBC: 311 ug/dL (ref 250–450)
UIBC: 298 ug/dL

## 2021-06-06 LAB — MAGNESIUM: Magnesium: 2 mg/dL (ref 1.7–2.4)

## 2021-06-06 LAB — FERRITIN: Ferritin: 87 ng/mL (ref 11–307)

## 2021-06-06 LAB — POTASSIUM: Potassium: 4.8 mmol/L (ref 3.5–5.1)

## 2021-06-06 MED ORDER — SODIUM CHLORIDE 0.9 % IV SOLN
200.0000 mg | INTRAVENOUS | Status: DC
  Administered 2021-06-06 – 2021-06-07 (×2): 200 mg via INTRAVENOUS
  Filled 2021-06-06 (×2): qty 200
  Filled 2021-06-06: qty 10

## 2021-06-06 MED ORDER — SODIUM ZIRCONIUM CYCLOSILICATE 10 G PO PACK
10.0000 g | PACK | Freq: Once | ORAL | Status: AC
  Administered 2021-06-06: 10 g via ORAL
  Filled 2021-06-06: qty 1

## 2021-06-06 MED ORDER — TRAMADOL HCL 50 MG PO TABS
50.0000 mg | ORAL_TABLET | Freq: Four times a day (QID) | ORAL | Status: DC | PRN
  Administered 2021-06-06 – 2021-06-07 (×2): 50 mg via ORAL
  Filled 2021-06-06 (×2): qty 1

## 2021-06-06 MED ORDER — ENOXAPARIN SODIUM 30 MG/0.3ML IJ SOSY
30.0000 mg | PREFILLED_SYRINGE | INTRAMUSCULAR | Status: DC
  Administered 2021-06-07: 30 mg via SUBCUTANEOUS
  Filled 2021-06-06: qty 0.3

## 2021-06-06 NOTE — Progress Notes (Signed)
PT Cancellation Note  Patient Details Name: [name] MRN: [id number] DOB: 06-20-1928   Cancelled Treatment:    Reason Eval/Treat Not Completed: Medical issues which prohibited therapy (Per chart review, pt's K+ elevated to 5.6, precludes our services at this time per facility policy. Will resume services once medically appropriate.)  12:05 PM, 06/06/21 Rosamaria Lints, PT, DPT Physical Therapist - [facility]  [phone] (ASCOM)    Catricia Scheerer C 06/06/2021, 12:05 PM

## 2021-06-06 NOTE — TOC Progression Note (Addendum)
Transition of Care [facility]) - Progression Note    Patient Details  Name: Brittany Munoz MRN: [id number] Date of Birth: 1927-12-28  [facility] CM/SW Contact  Marlowe Sax, RN Phone Number: 06/06/2021, 9:44 AM  Clinical Narrative:   Sherron Monday with [name] the patient's POA, Reviewed the bed offers and Medicare Star rating, She is going to check out the facilities and let me know which facility they choose, [name] called back and chose [facility] in [place], I notified [facility], anticipate DC tomorrow         Expected Discharge Plan and Services                                                 Social Determinants of Health (SDOH) Interventions    Readmission Risk Interventions No flowsheet data found.

## 2021-06-06 NOTE — Progress Notes (Signed)
PHARMACIST - PHYSICIAN COMMUNICATION  CONCERNING:  Enoxaparin (Lovenox) for DVT Prophylaxis   DESCRIPTION: Patient was prescribed enoxaprin 40mg  q24 hours for VTE prophylaxis.   Filed Weights   06/03/21 1459  Weight: 74.8 kg (165 lb)    Body mass index is 25.09 kg/m.  Estimated Creatinine Clearance: 23.5 mL/min (A) (by C-G formula based on SCr of 1.57 mg/dL (H)).  Patient is candidate for enoxaparin 30mg  every 24 hours based on CrCl <29ml/min or Weight <45kg  RECOMMENDATION: Pharmacy has adjusted enoxaparin dose per [facility] policy.  Patient is now receiving enoxaparin 30 mg every 24 hours    31m, PharmD Clinical Pharmacist  06/06/2021 2:53 PM

## 2021-06-06 NOTE — Progress Notes (Signed)
  Subjective: 2 Days Post-Op Procedure(s) (LRB): ARTHROPLASTY BIPOLAR HIP (HEMIARTHROPLASTY) (Left) Patient reports pain as mild-moderate Patient has significantly worsened confusion today. Plan for discharge to SNF when medically stable. Negative for chest pain and shortness of breath Fever: no Gastrointestinal:Negative for nausea and vomiting Patient is passing gas without pain.  Objective: Vital signs in last 24 hours: Temp:  [97.5 F (36.4 C)-98.4 F (36.9 C)] 98.4 F (36.9 C) (11/03 0828) Pulse Rate:  [81-99] 99 (11/03 0828) Resp:  [15-16] 15 (11/03 0828) BP: (102-139)/(51-119) 139/119 (11/03 0828) SpO2:  [93 %-97 %] 97 % (11/03 0828)  Intake/Output from previous day:  Intake/Output Summary (Last 24 hours) at 06/06/2021 1552 Last data filed at 06/06/2021 1413 Gross per 24 hour  Intake 240 ml  Output 1825 ml  Net -1585 ml    Intake/Output this shift: Total I/O In: 240 [P.O.:240] Out: -   Labs: Recent Labs    06/04/21 0856 06/05/21 0514 06/06/21 0600  HGB 12.2 9.7* 9.0*   Recent Labs    06/05/21 0514 06/06/21 0600  WBC 11.7* 11.3*  RBC 3.88 3.78*  HCT 31.0* 30.7*  PLT 266 247   Recent Labs    06/05/21 0514 06/06/21 0600  NA 140 140  K 3.0* 5.6*  CL 104 106  CO2 28 25  BUN 19 34*  CREATININE 0.84 1.57*  GLUCOSE 136* 85  CALCIUM 8.5* 8.8*   No results for input(s): LABPT, INR in the last 72 hours.   EXAM General - Patient is Disorganized and Confused Extremity - ABD soft Neurovascular intact Dorsiflexion/Plantar flexion intact Incision: scant drainage No cellulitis present Dressing/Incision - Blood tinged drainage to the left hip this AM. Motor Function - intact, moving foot and toes well on exam.  ABdomen soft with normal bowel sounds.  Past Medical History:  Diagnosis Date   Anxiety    Asthma    Cardiomyopathy (HCC)    Cataract    COPD (chronic obstructive pulmonary disease) (HCC)    Degenerative arthritis    Depression     Diabetes (HCC)    Eczema    Gout    Hemorrhoids    Hyperlipidemia    Hypertension    Nocturnal hypoxia    Osteoporosis    Pulmonary fibrosis (HCC)    Seasonal allergies    Shingles     Assessment/Plan: 2 Days Post-Op Procedure(s) (LRB): ARTHROPLASTY BIPOLAR HIP (HEMIARTHROPLASTY) (Left) Active Problems:   End stage COPD (HCC)   Essential hypertension   Closed left hip fracture (HCC)  Estimated body mass index is 25.09 kg/m as calculated from the following:   Height as of this encounter: 5\' 8"  (1.727 m).   Weight as of this encounter: 74.8 kg. Advance diet Up with therapy  Labs reviewed, K+ 5.6 this AM, Lokelma given today, continue to monitor. Hg 9.0, mild bloody drainage noted. Confused today, stop oxycodone and tramadol started for pain meds. If confusion continues, will obtain UA Will plan for discharge to SNF when medically stable.  Upon discharge continue Lovenox 40mg  daily for 14 days. Staple can be removed by SNF on 06/18/21 and follow-up with [facility] in 6 weeks.  DVT Prophylaxis - Lovenox and Foot Pumps Weight-Bearing as tolerated to left leg  J. 06/20/21, PA-C [facility] Orthopaedic Surgery 06/06/2021, 3:52 PM

## 2021-06-06 NOTE — Progress Notes (Signed)
Patient has not voided on this shift. Bladder scan this morning revealed 160cc of urine. Dr. Arville Care notified. States to increase patient's IVF to 100.

## 2021-06-06 NOTE — Progress Notes (Signed)
PROGRESS NOTE    Brittany Munoz  [id number] DOB: Apr 09, 1928 DOA: 06/04/2021 PCP: Leonel Ramsay, MD   Assessment & Plan:   Active Problems:   End stage COPD ([place])   Essential hypertension   Closed left hip fracture (HCC)   Closed left displaced hip fracture: s/p left hip bipolar hemiarthroplasty. D/c oxycodone as per medical POA's request and start tramadol. Waiting on SNF placement   Acute metabolic encephalopathy: etiology unclear, delirium vs mild cognitive impairment vs dementia vs narcotic induced. D/c oxycodone and start tramadol as per request of pt's medical POA   Acute blood loss anemia: likely secondary to recent surg. Will transfuse if Hb < 7.0   Leukocytosis: likely reactive. Will continue to monitor   Hypokalemia: resolved  Hyperkalemia: lokelma x1. Repeat potassium level ordered   HTN: continue on metoprolol   COPD: end stage. Continue on supplemental oxygen, chronically on  2.5L [place]. Hx of pulmonary fibrosis. Continue on bronchodilators  Chronic hypoxic respiratory failure: continue on supplemental oxygen   Depression: severity unknown. Continue on home dose of cymbalta   CKDIIIa: Cr is labile. Avoid nephrotoxic meds    DM2: HbA1c 6.0, well controlled. Diet controlled   DVT prophylaxis: lovenox  Code Status: full  Family Communication: discussed pt's care w/ pt's POA, Wanda, and answered her questions  Disposition Plan: likely d/c to SNF   Level of care: Med-Surg  Status is: Inpatient  Remains inpatient appropriate because: severity of illness      Consultants:  Ortho surg   Procedures:  Antimicrobials:   Subjective: Pt is confused   Objective: Vitals:   06/05/21 2018 06/05/21 2159 06/05/21 2319 06/06/21 0330  BP:  (!) 114/53 (!) 102/51 (!) 119/55  Pulse:  83 88 89  Resp:   16 16  Temp:   97.9 F (36.6 C) 98.1 F (36.7 C)  TempSrc:    Oral  SpO2: 93%  97% 97%  Weight:      Height:        Intake/Output Summary (Last  24 hours) at 06/06/2021 0749 Last data filed at 06/05/2021 1906 Gross per 24 hour  Intake 360 ml  Output 1825 ml  Net -1465 ml   Filed Weights   06/03/21 1459  Weight: 74.8 kg    Examination:  General exam: Appears agitated and frustrated  Respiratory system: clear breath sounds b/l  Cardiovascular system: S1/S2+. No rubs or clicks   Gastrointestinal system: Abd is soft, NT, ND & hypoactive bowel sounds  Central nervous system: alert and awake. Moves all extremities  Psychiatry: judgement and insight appears poor.      Data Reviewed: I have personally reviewed following labs and imaging studies  CBC: Recent Labs  Lab 06/04/21 0856 06/05/21 0514 06/06/21 0600  WBC 10.2 11.7* 11.3*  HGB 12.2 9.7* 9.0*  HCT 40.1 31.0* 30.7*  MCV 78.5* 79.9* 81.2  PLT 397 266 [id number]   Basic Metabolic Panel: Recent Labs  Lab 06/04/21 0856 06/05/21 0514 06/06/21 0600  NA 138 140 140  K 3.4* 3.0* 5.6*  CL 98 104 106  CO2 28 28 25   GLUCOSE 128* 136* 85  BUN 27* 19 34*  CREATININE 1.21* 0.84 1.57*  CALCIUM 9.5 8.5* 8.8*  MG  --   --  2.0   GFR: Estimated Creatinine Clearance: 23.5 mL/min (A) (by C-G formula based on SCr of 1.57 mg/dL (H)). Liver Function Tests: Recent Labs  Lab 06/04/21 0856  AST 18  ALT 11  ALKPHOS 76  BILITOT 0.9  PROT 7.7  ALBUMIN 3.9   No results for input(s): LIPASE, AMYLASE in the last 168 hours. No results for input(s): AMMONIA in the last 168 hours. Coagulation Profile: No results for input(s): INR, PROTIME in the last 168 hours. Cardiac Enzymes: No results for input(s): CKTOTAL, CKMB, CKMBINDEX, TROPONINI in the last 168 hours. BNP (last 3 results) No results for input(s): PROBNP in the last 8760 hours. HbA1C: No results for input(s): HGBA1C in the last 72 hours. CBG: Recent Labs  Lab 06/04/21 1922 06/04/21 2119 06/05/21 0727 06/05/21 1133 06/05/21 1957  GLUCAP 112* 128* 116* 126* 114*   Lipid Profile: No results for input(s): CHOL,  HDL, LDLCALC, TRIG, CHOLHDL, LDLDIRECT in the last 72 hours. Thyroid Function Tests: No results for input(s): TSH, T4TOTAL, FREET4, T3FREE, THYROIDAB in the last 72 hours. Anemia Panel: Recent Labs    06/06/21 0600  FERRITIN 87  TIBC 311  IRON 13*   Sepsis Labs: No results for input(s): PROCALCITON, LATICACIDVEN in the last 168 hours.  Recent Results (from the past 240 hour(s))  Resp Panel by RT-PCR (Flu A&B, Covid) Nasopharyngeal Swab     Status: None   Collection Time: 06/04/21 10:20 AM   Specimen: Nasopharyngeal Swab; Nasopharyngeal(NP) swabs in vial transport medium  Result Value Ref Range Status   SARS Coronavirus 2 by RT PCR NEGATIVE NEGATIVE Final    Comment: (NOTE) SARS-CoV-2 target nucleic acids are NOT DETECTED.  The SARS-CoV-2 RNA is generally detectable in upper respiratory specimens during the acute phase of infection. The lowest concentration of SARS-CoV-2 viral copies this assay can detect is 138 copies/mL. A negative result does not preclude SARS-Cov-2 infection and should not be used as the sole basis for treatment or other patient management decisions. A negative result may occur with  improper specimen collection/handling, submission of specimen other than nasopharyngeal swab, presence of viral mutation(s) within the areas targeted by this assay, and inadequate number of viral copies(<138 copies/mL). A negative result must be combined with clinical observations, patient history, and epidemiological information. The expected result is Negative.  Fact Sheet for Patients:  EntrepreneurPulse.com.au  Fact Sheet for Healthcare Providers:  IncredibleEmployment.be  This test is no t yet approved or cleared by the [place] FDA and  has been authorized for detection and/or diagnosis of SARS-CoV-2 by FDA under an Emergency Use Authorization (EUA). This EUA will remain  in effect (meaning this test can be used) for the  duration of the COVID-19 declaration under Section 564(b)(1) of the Act, 21 U.S.C.section 360bbb-3(b)(1), unless the authorization is terminated  or revoked sooner.       Influenza A by PCR NEGATIVE NEGATIVE Final   Influenza B by PCR NEGATIVE NEGATIVE Final    Comment: (NOTE) The Xpert Xpress SARS-CoV-2/FLU/RSV plus assay is intended as an aid in the diagnosis of influenza from Nasopharyngeal swab specimens and should not be used as a sole basis for treatment. Nasal washings and aspirates are unacceptable for Xpert Xpress SARS-CoV-2/FLU/RSV testing.  Fact Sheet for Patients: EntrepreneurPulse.com.au  Fact Sheet for Healthcare Providers: IncredibleEmployment.be  This test is not yet approved or cleared by the [place] FDA and has been authorized for detection and/or diagnosis of SARS-CoV-2 by FDA under an Emergency Use Authorization (EUA). This EUA will remain in effect (meaning this test can be used) for the duration of the COVID-19 declaration under Section 564(b)(1) of the Act, 21 U.S.C. section 360bbb-3(b)(1), unless the authorization is terminated or revoked.  Performed at [facility],  [address], [place], [place] [address]   Surgical pcr screen     Status: None   Collection Time: 06/04/21  1:50 PM   Specimen: Nasal Mucosa; Nasal Swab  Result Value Ref Range Status   MRSA, PCR NEGATIVE NEGATIVE Final   Staphylococcus aureus NEGATIVE NEGATIVE Final    Comment: (NOTE) The Xpert SA Assay (FDA approved for NASAL specimens in patients 66 years of age and older), is one component of a comprehensive surveillance program. It is not intended to diagnose infection nor to guide or monitor treatment. Performed at [facility], [address] [place], [place] [address]          Radiology Studies: CT Hip Left Wo Contrast  Result Date: 06/04/2021 CLINICAL DATA:  Hip pain, stress fracture suspected, neg  xray EXAM: CT OF THE LEFT HIP WITHOUT CONTRAST TECHNIQUE: Multidetector CT imaging of the left hip was performed according to the standard protocol. Multiplanar CT image reconstructions were also generated. COMPARISON:  Same day radiograph, CT 05/14/2021 FINDINGS: Bones/Joint/Cartilage There is an acute, mild displaced and impacted left transcervical/basicervical left femoral neck fracture with varus angulation. There is moderate left hip osteoarthritis. Small joint effusion. Ligaments Suboptimally assessed by CT. Muscles and Tendons Mild generalized loss of muscle bulk and moderate to severe gluteus minimus and medius muscle atrophy. No intramuscular collection. Soft tissues Superficial soft tissue swelling along the hip. No focal fluid collection. IMPRESSION: Acute, displaced and mildly impacted left transcervical/basicervical left femoral neck fracture with varus angulation. Electronically Signed   By: Caprice Renshaw M.D.   On: 06/04/2021 09:54   DG Chest Port 1 View  Result Date: 06/04/2021 CLINICAL DATA:  Preop evaluation for hip fracture EXAM: PORTABLE CHEST 1 VIEW COMPARISON:  07/29/2013 FINDINGS: Transverse diameter of heart is slightly increased. There are no signs of pulmonary edema or focal pulmonary consolidation. There is no pleural effusion or pneumothorax. IMPRESSION: There are no signs of pulmonary edema or focal pulmonary consolidation. Electronically Signed   By: Ernie Avena M.D.   On: 06/04/2021 10:39   DG HIP UNILAT W OR W/O PELVIS 2-3 VIEWS LEFT  Result Date: 06/04/2021 CLINICAL DATA:  Post left hip hemiarthroplasty. EXAM: DG HIP (WITH OR WITHOUT PELVIS) 2-3V LEFT COMPARISON:  Preoperative imaging earlier today. FINDINGS: Left hip hemi-arthroplasty in expected alignment. No periprosthetic lucency or fracture. Recent postsurgical change includes air and edema in the soft tissues. Lateral skin staples in place. IMPRESSION: Left hip hemiarthroplasty without immediate postoperative  complication. Electronically Signed   By: Narda Rutherford M.D.   On: 06/04/2021 19:52        Scheduled Meds:  amLODipine  5 mg Oral Daily   budesonide  2 mL Inhalation BID   docusate sodium  100 mg Oral BID   DULoxetine  60 mg Oral Daily   enoxaparin (LOVENOX) injection  40 mg Subcutaneous Q24H   guaiFENesin  600 mg Oral q12n4p   metoprolol succinate  25 mg Oral Daily   montelukast  10 mg Oral QHS   pantoprazole  40 mg Oral Daily   rOPINIRole  0.5 mg Oral TID   sodium zirconium cyclosilicate  10 g Oral Once   torsemide  20 mg Oral Daily   Continuous Infusions:  sodium chloride 100 mL/hr at 06/06/21 9417   clindamycin (CLEOCIN) IV       LOS: 2 days    Time spent: 30 mins     Charise Killian, MD [facility] Pager [phone]  If 7PM-7AM,  please contact night-coverage 06/06/2021, 7:49 AM

## 2021-06-06 NOTE — Progress Notes (Signed)
Patient presents with an extreme level of agitation and mild confusion. She demands that this nurse leave her room and states "I do not need any med, you get on away from here'. This nurse attempts to educate the patient on the effectiveness of her medication regime and she still refuses to take her medications. Patient is not endorse any pain or SOB.

## 2021-06-06 NOTE — Progress Notes (Signed)
OT Cancellation Note  Patient Details Name: Brittany Munoz MRN: [id number] DOB: 10/20/1927   Cancelled Treatment:    Reason Eval/Treat Not Completed: Medical issues which prohibited therapy. Pt noted with K+ 5.6, contraindicated for therapy. Will re-attempt at later date/time as medically appropriate.   Arman Filter., MPH, MS, OTR/L ascom [phone] 06/06/21, 8:40 AM

## 2021-06-07 DIAGNOSIS — S72002A Fracture of unspecified part of neck of left femur, initial encounter for closed fracture: Secondary | ICD-10-CM | POA: Diagnosis not present

## 2021-06-07 DIAGNOSIS — G9341 Metabolic encephalopathy: Secondary | ICD-10-CM | POA: Diagnosis not present

## 2021-06-07 DIAGNOSIS — J449 Chronic obstructive pulmonary disease, unspecified: Secondary | ICD-10-CM | POA: Diagnosis not present

## 2021-06-07 LAB — CBC
HCT: 31.2 % — ABNORMAL LOW (ref 36.0–46.0)
Hemoglobin: 9.5 g/dL — ABNORMAL LOW (ref 12.0–15.0)
MCH: 24.4 pg — ABNORMAL LOW (ref 26.0–34.0)
MCHC: 30.4 g/dL (ref 30.0–36.0)
MCV: 80 fL (ref 80.0–100.0)
Platelets: 270 10*3/uL (ref 150–400)
RBC: 3.9 MIL/uL (ref 3.87–5.11)
RDW: 15.5 % (ref 11.5–15.5)
WBC: 10.4 10*3/uL (ref 4.0–10.5)
nRBC: 0 % (ref 0.0–0.2)

## 2021-06-07 LAB — BASIC METABOLIC PANEL
Anion gap: 11 (ref 5–15)
BUN: 33 mg/dL — ABNORMAL HIGH (ref 8–23)
CO2: 25 mmol/L (ref 22–32)
Calcium: 9 mg/dL (ref 8.9–10.3)
Chloride: 101 mmol/L (ref 98–111)
Creatinine, Ser: 1.31 mg/dL — ABNORMAL HIGH (ref 0.44–1.00)
GFR, Estimated: 38 mL/min — ABNORMAL LOW (ref >60)
Glucose, Bld: 84 mg/dL (ref 70–99)
Potassium: 4.1 mmol/L (ref 3.5–5.1)
Sodium: 137 mmol/L (ref 135–145)

## 2021-06-07 LAB — MAGNESIUM: Magnesium: 2.1 mg/dL (ref 1.7–2.4)

## 2021-06-07 MED ORDER — ENOXAPARIN SODIUM 30 MG/0.3ML IJ SOSY: 30.0000 mg | PREFILLED_SYRINGE | INTRAMUSCULAR | 0 refills | Status: DC

## 2021-06-07 MED ORDER — ENOXAPARIN SODIUM 30 MG/0.3ML IJ SOSY: 30.0000 mg | PREFILLED_SYRINGE | Freq: Two times a day (BID) | INTRAMUSCULAR | 0 refills | Status: DC

## 2021-06-07 MED ORDER — FERROUS GLUCONATE 240 (27 FE) MG PO TABS: 240.0000 mg | ORAL_TABLET | Freq: Three times a day (TID) | ORAL | Status: AC

## 2021-06-07 MED ORDER — ENOXAPARIN SODIUM 40 MG/0.4ML IJ SOSY: 40.0000 mg | PREFILLED_SYRINGE | INTRAMUSCULAR | 0 refills | Status: DC

## 2021-06-07 MED ORDER — TRAMADOL HCL 50 MG PO TABS: 50.0000 mg | ORAL_TABLET | Freq: Four times a day (QID) | ORAL | 0 refills | Status: AC | PRN

## 2021-06-07 NOTE — Progress Notes (Signed)
Physical Therapy Treatment Patient Details Name: Brittany Munoz MRN: [id number] DOB: 09/22/1927 Today's Date: 06/07/2021   History of Present Illness Brittany Munoz is a 89yoF who comes to [facility] 06/03/21 after 2+ weeks continued LLE pain and difficulty AMB s/p fall earlier in month. Pt here on 05/14/21 after a fall c subsequent LLE pain from knee to hip, imaging negative at that time. PMH: COPD on O2, pulmonary fibrosis, HTN, DM, osteoporosis, gout, depression, GAD, cardiomyopathy, resides at [facility], RW AMB at baseline. CT scan shows displaced Left hip fracture. Pt went to OR c Dr. Leron Croak for Lt hip hemiarthroplasty using "standard posterior approach". Per Dr. 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 Pt is WBAT LLE.    PT Comments    Pt in bed upon entry, awake, somewhat restless, verbose about her strong desire to return to home and distress due to being in an unfamiliar environment for several days. Pt still much more confused compared to our first interaction on POD1. Author attempted to orient pt to situation, place, time, but success is minimal. Pt believes to be in a retail store at present. She is motivated to get OOB for toileting, not oriented to presence of Purewick, believes to be wearing a "diaper" which she is not. TotalA lateral scoot, maxA pivot to EOB with elevated HOB, failed attempt at lateral scoot, then maxA squat pivot to recliner (pt had already peed into Purewick post-urine-voiding negative-pressure and evacuation apparatus at this time). Pt agreeable to stay up to chair and allow for linen change to transpire. Pt surprisingly tolerant of squat pivot transfer, less sign of pain than author anticipated, particularly compared to 2 days prior.   Recommendations for follow up therapy are one component of a multi-disciplinary discharge planning process, led by the attending physician.  Recommendations may be updated based on patient  status, additional functional criteria and insurance authorization.  Follow Up Recommendations  Skilled nursing-short term rehab (<3 hours/day)     Assistance Recommended at Discharge Intermittent Supervision/Assistance  Equipment Recommendations  None recommended by PT    Recommendations for Other Services       Precautions / Restrictions Precautions Precautions: Fall;Posterior Hip Precaution Booklet Issued: No Precaution Comments: not cognitively appropriate Restrictions Weight Bearing Restrictions: No LLE Weight Bearing: Weight bearing as tolerated     Mobility  Bed Mobility Overal bed mobility: Needs Assistance Bed Mobility: Sit to Supine     Supine to sit: Max assist     General bed mobility comments: maxA lateral scoot on chuck,    Transfers     Transfers: Lateral/Scoot Transfers;Squat Pivot Transfers     Squat pivot transfers: Max assist (maxA SPT to recliner)      Lateral/Scoot Transfers:  (desires to attempt, ultimately unable)      Ambulation/Gait                 Stairs             Wheelchair Mobility    Modified Rankin (Stroke Patients Only)       Balance                                            Cognition Arousal/Alertness: Awake/alert Behavior During Therapy: Anxious;Restless Overall Cognitive Status: Impaired/Different from baseline Area of Impairment: Orientation;Memory  Orientation Level: Place;Situation;Disoriented to;Time   Memory: Decreased recall of precautions                  Exercises Total Joint Exercises Long Arc Quad: Left;Seated Long Arc Quad Limitations: attempted, unsuccessfl    General Comments        Pertinent Vitals/Pain Pain Assessment: No/denies pain Faces Pain Scale: Hurts a little bit Pain Location: difficulty getting a clear report given delerium Pain Intervention(s): Limited activity within patient's tolerance;Monitored during  session;Repositioned    Home Living                          Prior Function            PT Goals (current goals can now be found in the care plan section) Acute Rehab PT Goals Patient Stated Goal: to return to walking, decrease pain PT Goal Formulation: With patient Time For Goal Achievement: 06/19/21 Potential to Achieve Goals: Fair Progress towards PT goals: Progressing toward goals    Frequency    7X/week      PT Plan Current plan remains appropriate    Co-evaluation              AM-PAC PT "6 Clicks" Mobility   Outcome Measure  Help needed turning from your back to your side while in a flat bed without using bedrails?: Total Help needed moving from lying on your back to sitting on the side of a flat bed without using bedrails?: Total Help needed moving to and from a bed to a chair (including a wheelchair)?: Total Help needed standing up from a chair using your arms (e.g., wheelchair or bedside chair)?: Total Help needed to walk in hospital room?: Total Help needed climbing 3-5 steps with a railing? : Total 6 Click Score: 6    End of Session Equipment Utilized During Treatment: Oxygen Activity Tolerance: Patient limited by pain Patient left: in bed;with nursing/sitter in room;Other (comment) Nurse Communication: Mobility status PT Visit Diagnosis: Unsteadiness on feet (R26.81);Muscle weakness (generalized) (M62.81);Difficulty in walking, not elsewhere classified (R26.2);History of falling (Z91.81);Pain     Time: 9518-8416 PT Time Calculation (min) (ACUTE ONLY): 18 min  Charges:  $Therapeutic Activity: 8-22 mins                    10:20 AM, 06/07/21 Rosamaria Lints, PT, DPT Physical Therapist - [facility]  [phone] (ASCOM)    Chen Saadeh C 06/07/2021, 10:14 AM

## 2021-06-07 NOTE — Discharge Summary (Signed)
Physician Discharge Summary  Brittany Munoz [id number] DOB: November 17, 1927 DOA: 06/04/2021  PCP: Leonel Ramsay, MD  Admit date: 06/04/2021 Discharge date: 06/07/2021  Admitted From: home  Disposition: SNF  Recommendations for Outpatient Follow-up:  Follow up with PCP in 1-2 weeks F/u w/ ortho surg, Dr. Roland Rack, in 6 weeks   Home Health: no  Equipment/Devices:  Discharge Condition: stable  CODE STATUS: Full  Diet recommendation: Heart Healthy / Carb Modified   Brief/Interim Summary: HPI was taken from Dr. Leslye Peer: Brittany Munoz  is a 87 y.o. female with a known history of COPD on home oxygen, hypertension, pulmonary fibrosis.  Patient states that she has had a week of pain on her left hip and the last few days not been able to get around very well.  Pain worse when she is trying to go to the bathroom.  She does not recall having a fall.  In the ER she is in a lot of pain.  Given couple doses of fentanyl.  ER physician did a CT scan of the left hip which showed a displaced left hip fracture.  Hospitalist services contacted for admission.   Hospital course from Dr. Jimmye Norman 11/2-11/4/22: Pt presented w/ a hip fracture. Pt is s/p left hip bipolar hemiarthroplasty on 06/04/21. PT/OT evaluated the pt and recommended SNF. Of note, pt did have acute blood loss anemia but did not require a blood transfusion. For more information, please see previous progress/consult notes.    Discharge Diagnoses:  Active Problems:   End stage COPD ([place])   Essential hypertension   Closed left hip fracture ([facility])  Closed left displaced hip fracture: s/p left hip bipolar hemiarthroplasty. D/c oxycodone as per medical POA's request and continue tramadol. Continue on lovenox x 14 days for DVT prophylaxis as per ortho surg. Staple can be removed by SNF on 06/18/21 as per ortho surg   Acute metabolic encephalopathy: etiology unclear, delirium vs mild cognitive impairment vs dementia vs narcotic induced. D/c  oxycodone and continue tramadol as per request of pt's medical POA   Acute blood loss anemia: likely secondary to recent surg. No need for a transfusion currently   Leukocytosis: resolved   Hypokalemia: resolved  Hyperkalemia: resolved  HTN: continue on metoprolol   COPD: end stage. Continue on supplemental oxygen, chronically on  2.5L [place]. Hx of pulmonary fibrosis. Continue on bronchodilators  Chronic hypoxic respiratory failure: continue on supplemental oxygen   Depression: severity unknown. Continue on home dose of cymbalta   CKDIIIa: Cr is labile. Avoid nephrotoxic meds    DM2: HbA1c 6.0, well controlled. Diet controlled    Discharge Instructions  Discharge Instructions     Diet - low sodium heart healthy   Complete by: As directed    Discharge instructions   Complete by: As directed    F/u w/ PCP in 1-2 weeks. F/u w/ ortho surg,Dr. Roland Rack, in 6 weeks. Staple can be removed by SNF on 06/18/21 and follow-up with [facility] in 6 weeks.   Increase activity slowly   Complete by: As directed    No wound care   Complete by: As directed       Allergies as of 06/07/2021       Reactions   Aspirin Anaphylaxis   Cefuroxime Axetil Swelling   TOLERATED CEFAZOLIN 06/04/21   Moxifloxacin Hcl In Nacl    Other reaction(s): Unknown   Oxycodone Nausea Only   Sulfamethoxazole-trimethoprim    Other reaction(s): Unknown   Tramadol    Other reaction(s):  Unknown        Medication List     STOP taking these medications    oxyCODONE 5 MG immediate release tablet Commonly known as: Oxy IR/ROXICODONE       TAKE these medications    acetaminophen 650 MG CR tablet Commonly known as: TYLENOL Take 1,300 mg by mouth 2 (two) times daily.   amLODipine 5 MG tablet Commonly known as: NORVASC Take 5 mg by mouth daily.   budesonide 0.25 MG/2ML nebulizer solution Commonly known as: PULMICORT Inhale 2 mLs into the lungs 2 (two) times daily as needed for wheezing or  shortness of breath.   cyanocobalamin 1000 MCG/ML injection Commonly known as: (VITAMIN B-12) Inject 1,000 mcg into the muscle every 30 (thirty) days.   dicyclomine 10 MG capsule Commonly known as: BENTYL Take 10 mg by mouth 2 (two) times daily as needed.   DULoxetine 60 MG capsule Commonly known as: CYMBALTA Take 60 mg by mouth daily.   enoxaparin 30 MG/0.3ML injection Commonly known as: LOVENOX Inject 0.3 mLs (30 mg total) into the skin daily for 14 days. Start taking on: June 08, 2021   ferrous gluconate 240 (27 FE) MG tablet Commonly known as: FERGON Take 1 tablet (240 mg total) by mouth 3 (three) times daily with meals.   guaiFENesin 600 MG 12 hr tablet Commonly known as: MUCINEX Take 600 mg by mouth 2 times daily at 12 noon and 4 pm.   LORazepam 1 MG tablet Commonly known as: ATIVAN Take 2 mg by mouth at bedtime.   metoprolol succinate 25 MG 24 hr tablet Commonly known as: TOPROL-XL Take 25 mg by mouth daily.   montelukast 10 MG tablet Commonly known as: SINGULAIR Take 10 mg by mouth daily.   omeprazole 40 MG capsule Commonly known as: PRILOSEC Take 40 mg by mouth daily.   ProAir HFA 108 (90 Base) MCG/ACT inhaler Generic drug: albuterol USE 2 PUFFS EVERY 4 HOURS  AS NEEDED   rOPINIRole 0.5 MG tablet Commonly known as: REQUIP Take 0.5 mg by mouth 2 (two) times daily.   torsemide 20 MG tablet Commonly known as: DEMADEX Take 20 mg by mouth daily.   traMADol 50 MG tablet Commonly known as: ULTRAM Take 1 tablet (50 mg total) by mouth every 6 (six) hours as needed for up to 1 day for moderate pain.   Vitamin D (Ergocalciferol) 1.25 MG (50000 UNIT) Caps capsule Commonly known as: DRISDOL Take 1 capsule by mouth once a week.        Contact information for after-discharge care     Destination     [facility] .   Service: Skilled Nursing Contact information: [address] [place] [address] [phone]                     Allergies  Allergen Reactions   Aspirin Anaphylaxis   Cefuroxime Axetil Swelling    TOLERATED CEFAZOLIN 06/04/21   Moxifloxacin Hcl In Nacl     Other reaction(s): Unknown   Oxycodone Nausea Only   Sulfamethoxazole-Trimethoprim     Other reaction(s): Unknown   Tramadol     Other reaction(s): Unknown    Consultations: Ortho surg    Procedures/Studies: DG Lumbar Spine 2-3 Views  Result Date: 06/03/2021 CLINICAL DATA:  Trauma, fall, pain EXAM: LUMBAR SPINE - 2-3 VIEW COMPARISON:  MRI lumbar spine done on 01/27/2017 FINDINGS: No recent fracture is seen. There is first-degree anterolisthesis at L4-L5 level. Degenerative changes are  noted with marked disc space narrowing, bony spurs and facet hypertrophy at L5-S1 level. Osteopenia is seen in bony structures. There are scattered arterial calcifications. Surgical clips are seen in gallbladder fossa. Marked degenerative changes are noted in right hip. IMPRESSION: No recent fracture is seen. Osteopenia. There is first-degree anterolisthesis at L4-L5 level. Degenerative changes are noted in lumbar spine, particularly severe at L5-S1 level. Degenerative changes are noted in right hip. Electronically Signed   By: Elmer Picker M.D.   On: 06/03/2021 16:17   DG Pelvis 1-2 Views  Result Date: 06/03/2021 CLINICAL DATA:  Left leg pain after fall. EXAM: PELVIS - 1-2 VIEW COMPARISON:  None. FINDINGS: There is no evidence of pelvic fracture or diastasis. No pelvic bone lesions are seen. Severe degenerative changes seen involving the right hip joint. Mild degenerative changes are seen involving the left hip. IMPRESSION: No acute abnormality seen. Electronically Signed   By: Marijo Conception M.D.   On: 06/03/2021 16:14   DG Knee 1-2 Views Left  Result Date: 05/14/2021 CLINICAL DATA:  [place] in bathroom last night, LEFT knee pain EXAM: LEFT KNEE - 1-2 VIEW COMPARISON:  None FINDINGS: Osseous demineralization. Advanced degenerative  changes LEFT knee with joint space narrowing and spur formation, greatest at lateral compartment where bone-on-bone appearance is seen. No acute fracture, dislocation, or bone destruction. No joint effusion. IMPRESSION: Osseous demineralization with advanced osteoarthritic changes LEFT knee greatest at lateral compartment. No acute osseous abnormalities. Electronically Signed   By: Lavonia Dana M.D.   On: 05/14/2021 13:26   CT PELVIS WO CONTRAST  Result Date: 05/14/2021 CLINICAL DATA:  Pelvic trauma.  Status post fall. EXAM: CT PELVIS WITHOUT CONTRAST TECHNIQUE: Multidetector CT imaging of the pelvis was performed following the standard protocol without intravenous contrast. COMPARISON:  CT abdomen pelvis 09/09/2016 FINDINGS: Urinary Tract:  No abnormality visualized. Bowel: Scattered colonic diverticulosis unremarkable visualized pelvic bowel loops. Vascular/Lymphatic: Atherosclerotic plaque. No pathologically enlarged lymph nodes. No significant vascular abnormality seen. Reproductive:  No mass or other significant abnormality Other: No intraperitoneal free fluid. No intraperitoneal free gas. No organized fluid collection. Musculoskeletal: Diffusely decreased bone density. No suspicious bone lesions identified. No acute displaced fracture. Severe degenerative changes of the right femoroacetabular joint. Mild degenerative changes of the left femoroacetabular joint. Likely old healed fracture of the S2/S1 level. IMPRESSION: 1. No acute displaced fracture of the bilateral hips and pelvis. No pelvic diastasis. 2. Severe degenerative changes of the right hip in a patient with diffusely decreased bone density. 3. Scattered colonic diverticulosis with no acute diverticulitis. Electronically Signed   By: Iven Finn M.D.   On: 05/14/2021 17:40   CT Knee Left Wo Contrast  Result Date: 05/14/2021 CLINICAL DATA:  [place] yesterday, left knee pain, inability to straighten left knee EXAM: CT OF THE LEFT KNEE WITHOUT  CONTRAST TECHNIQUE: Multidetector CT imaging of the LEFT knee was performed according to the standard protocol. Multiplanar CT image reconstructions were also generated. COMPARISON:  05/14/2021 FINDINGS: Bones/Joint/Cartilage The left knee is held in flexion during imaging. The bones are diffusely osteopenic. There are no acute displaced fractures. There is severe 3 compartmental osteoarthritis with bone on bone contact in the lateral compartment. There is severe joint space narrowing within the lateral compartment of the patellofemoral compartment. There is a trace suprapatellar scratch at there is a small suprapatellar effusion, likely reactive. Ligaments Suboptimally assessed by CT. Muscles and Tendons Diffuse muscular atrophy.  No gross abnormalities. Soft tissues Unremarkable. Reconstructed images demonstrate no additional findings. IMPRESSION:  1. Severe 3 compartmental osteoarthritis greatest in the lateral and patellofemoral compartments as above. 2. No acute displaced fracture. 3. Small joint effusion. 4. Severe osteopenia. Electronically Signed   By: Sharlet Salina M.D.   On: 05/14/2021 17:39   CT Hip Left Wo Contrast  Result Date: 06/04/2021 CLINICAL DATA:  Hip pain, stress fracture suspected, neg xray EXAM: CT OF THE LEFT HIP WITHOUT CONTRAST TECHNIQUE: Multidetector CT imaging of the left hip was performed according to the standard protocol. Multiplanar CT image reconstructions were also generated. COMPARISON:  Same day radiograph, CT 05/14/2021 FINDINGS: Bones/Joint/Cartilage There is an acute, mild displaced and impacted left transcervical/basicervical left femoral neck fracture with varus angulation. There is moderate left hip osteoarthritis. Small joint effusion. Ligaments Suboptimally assessed by CT. Muscles and Tendons Mild generalized loss of muscle bulk and moderate to severe gluteus minimus and medius muscle atrophy. No intramuscular collection. Soft tissues Superficial soft tissue swelling  along the hip. No focal fluid collection. IMPRESSION: Acute, displaced and mildly impacted left transcervical/basicervical left femoral neck fracture with varus angulation. Electronically Signed   By: Caprice Renshaw M.D.   On: 06/04/2021 09:54   DG Chest Port 1 View  Result Date: 06/04/2021 CLINICAL DATA:  Preop evaluation for hip fracture EXAM: PORTABLE CHEST 1 VIEW COMPARISON:  07/29/2013 FINDINGS: Transverse diameter of heart is slightly increased. There are no signs of pulmonary edema or focal pulmonary consolidation. There is no pleural effusion or pneumothorax. IMPRESSION: There are no signs of pulmonary edema or focal pulmonary consolidation. Electronically Signed   By: Ernie Avena M.D.   On: 06/04/2021 10:39   DG Knee Complete 4 Views Left  Result Date: 06/04/2021 CLINICAL DATA:  Fall and trauma to the left knee. EXAM: LEFT KNEE - COMPLETE 4+ VIEW COMPARISON:  None. FINDINGS: No acute fracture or dislocation. The bones are osteopenic. Severe arthritic changes with tricompartmental narrowing and spurring. Small suprapatellar effusion. The soft tissues are unremarkable. IMPRESSION: 1. No acute fracture or dislocation. 2. Severe arthritic changes. Electronically Signed   By: Elgie Collard M.D.   On: 06/04/2021 00:32   DG HIP UNILAT W OR W/O PELVIS 2-3 VIEWS LEFT  Result Date: 06/04/2021 CLINICAL DATA:  Post left hip hemiarthroplasty. EXAM: DG HIP (WITH OR WITHOUT PELVIS) 2-3V LEFT COMPARISON:  Preoperative imaging earlier today. FINDINGS: Left hip hemi-arthroplasty in expected alignment. No periprosthetic lucency or fracture. Recent postsurgical change includes air and edema in the soft tissues. Lateral skin staples in place. IMPRESSION: Left hip hemiarthroplasty without immediate postoperative complication. Electronically Signed   By: Narda Rutherford M.D.   On: 06/04/2021 19:52   DG Hip Unilat W or Wo Pelvis 2-3 Views Left  Result Date: 05/14/2021 CLINICAL DATA:  Fall. EXAM: DG HIP  (WITH OR WITHOUT PELVIS) 2-3V LEFT; DG HIP (WITH OR WITHOUT PELVIS) 2-3V RIGHT COMPARISON:  CT abdomen pelvis 08/10/2012 FINDINGS: Severe degenerative changes of the right femoroacetabular joint. At least mild to moderate degenerative changes of the left acetabulofemoral joint. There is no evidence of hip fracture or dislocation. No aggressive appearing focal bone abnormality. Atherosclerotic plaque. IMPRESSION: 1. No acute displaced fracture or dislocation of bilateral hips. 2. No acute displaced fracture or diastasis of the bones of the pelvis. 3. Severe degenerative changes of the right hip. Electronically Signed   By: Tish Frederickson M.D.   On: 05/14/2021 15:56   DG Hip Unilat W or Wo Pelvis 2-3 Views Right  Result Date: 05/14/2021 CLINICAL DATA:  Fall. EXAM: DG HIP (WITH OR WITHOUT  PELVIS) 2-3V LEFT; DG HIP (WITH OR WITHOUT PELVIS) 2-3V RIGHT COMPARISON:  CT abdomen pelvis 08/10/2012 FINDINGS: Severe degenerative changes of the right femoroacetabular joint. At least mild to moderate degenerative changes of the left acetabulofemoral joint. There is no evidence of hip fracture or dislocation. No aggressive appearing focal bone abnormality. Atherosclerotic plaque. IMPRESSION: 1. No acute displaced fracture or dislocation of bilateral hips. 2. No acute displaced fracture or diastasis of the bones of the pelvis. 3. Severe degenerative changes of the right hip. Electronically Signed   By: Iven Finn M.D.   On: 05/14/2021 15:56   (Echo, Carotid, EGD, Colonoscopy, ERCP)    Subjective: Pt c/o fatigue    Discharge Exam: Vitals:   06/07/21 0731 06/07/21 0800  BP: (!) 150/93   Pulse: 100   Resp: 18   Temp: 98.7 F (37.1 C)   SpO2: 98% 92%   Vitals:   06/06/21 2313 06/07/21 0442 06/07/21 0731 06/07/21 0800  BP: 127/67 131/76 (!) 150/93   Pulse: (!) 102 (!) 103 100   Resp: 17 17 18    Temp: 98.1 F (36.7 C) 97.7 F (36.5 C) 98.7 F (37.1 C)   TempSrc:   Oral   SpO2: 98% 96% 98% 92%   Weight:      Height:        General: Pt is alert, awake, not in acute distress Cardiovascular: S1/S2 +, no rubs, no gallops Respiratory: CTA bilaterally, no wheezing, no rhonchi Abdominal: Soft, NT, ND, bowel sounds + Extremities: no cyanosis    The results of significant diagnostics from this hospitalization (including imaging, microbiology, ancillary and laboratory) are listed below for reference.     Microbiology: Recent Results (from the past 240 hour(s))  Resp Panel by RT-PCR (Flu A&B, Covid) Nasopharyngeal Swab     Status: None   Collection Time: 06/04/21 10:20 AM   Specimen: Nasopharyngeal Swab; Nasopharyngeal(NP) swabs in vial transport medium  Result Value Ref Range Status   SARS Coronavirus 2 by RT PCR NEGATIVE NEGATIVE Final    Comment: (NOTE) SARS-CoV-2 target nucleic acids are NOT DETECTED.  The SARS-CoV-2 RNA is generally detectable in upper respiratory specimens during the acute phase of infection. The lowest concentration of SARS-CoV-2 viral copies this assay can detect is 138 copies/mL. A negative result does not preclude SARS-Cov-2 infection and should not be used as the sole basis for treatment or other patient management decisions. A negative result may occur with  improper specimen collection/handling, submission of specimen other than nasopharyngeal swab, presence of viral mutation(s) within the areas targeted by this assay, and inadequate number of viral copies(<138 copies/mL). A negative result must be combined with clinical observations, patient history, and epidemiological information. The expected result is Negative.  Fact Sheet for Patients:  EntrepreneurPulse.com.au  Fact Sheet for Healthcare Providers:  IncredibleEmployment.be  This test is no t yet approved or cleared by the [place] FDA and  has been authorized for detection and/or diagnosis of SARS-CoV-2 by FDA under an Emergency Use Authorization  (EUA). This EUA will remain  in effect (meaning this test can be used) for the duration of the COVID-19 declaration under Section 564(b)(1) of the Act, 21 U.S.C.section 360bbb-3(b)(1), unless the authorization is terminated  or revoked sooner.       Influenza A by PCR NEGATIVE NEGATIVE Final   Influenza B by PCR NEGATIVE NEGATIVE Final    Comment: (NOTE) The Xpert Xpress SARS-CoV-2/FLU/RSV plus assay is intended as an aid in the diagnosis of influenza from Nasopharyngeal  swab specimens and should not be used as a sole basis for treatment. Nasal washings and aspirates are unacceptable for Xpert Xpress SARS-CoV-2/FLU/RSV testing.  Fact Sheet for Patients: EntrepreneurPulse.com.au  Fact Sheet for Healthcare Providers: IncredibleEmployment.be  This test is not yet approved or cleared by the [place] FDA and has been authorized for detection and/or diagnosis of SARS-CoV-2 by FDA under an Emergency Use Authorization (EUA). This EUA will remain in effect (meaning this test can be used) for the duration of the COVID-19 declaration under Section 564(b)(1) of the Act, 21 U.S.C. section 360bbb-3(b)(1), unless the authorization is terminated or revoked.  Performed at [facility], [address], [place], [place] [address]   Surgical pcr screen     Status: None   Collection Time: 06/04/21  1:50 PM   Specimen: Nasal Mucosa; Nasal Swab  Result Value Ref Range Status   MRSA, PCR NEGATIVE NEGATIVE Final   Staphylococcus aureus NEGATIVE NEGATIVE Final    Comment: (NOTE) The Xpert SA Assay (FDA approved for NASAL specimens in patients 39 years of age and older), is one component of a comprehensive surveillance program. It is not intended to diagnose infection nor to guide or monitor treatment. Performed at [facility], [address] [place], [place] [address]      Labs: BNP (last 3 results) No results for input(s): BNP  in the last 8760 hours. Basic Metabolic Panel: Recent Labs  Lab 06/04/21 0856 06/05/21 0514 06/06/21 0600 06/06/21 1545 06/07/21 0631  NA 138 140 140  --  137  K 3.4* 3.0* 5.6* 4.8 4.1  CL 98 104 106  --  101  CO2 28 28 25   --  25  GLUCOSE 128* 136* 85  --  84  BUN 27* 19 34*  --  33*  CREATININE 1.21* 0.84 1.57*  --  1.31*  CALCIUM 9.5 8.5* 8.8*  --  9.0  MG  --   --  2.0  --  2.1   Liver Function Tests: Recent Labs  Lab 06/04/21 0856  AST 18  ALT 11  ALKPHOS 76  BILITOT 0.9  PROT 7.7  ALBUMIN 3.9   No results for input(s): LIPASE, AMYLASE in the last 168 hours. No results for input(s): AMMONIA in the last 168 hours. CBC: Recent Labs  Lab 06/04/21 0856 06/05/21 0514 06/06/21 0600 06/07/21 0911  WBC 10.2 11.7* 11.3* 10.4  HGB 12.2 9.7* 9.0* 9.5*  HCT 40.1 31.0* 30.7* 31.2*  MCV 78.5* 79.9* 81.2 80.0  PLT 397 266 247 270   Cardiac Enzymes: No results for input(s): CKTOTAL, CKMB, CKMBINDEX, TROPONINI in the last 168 hours. BNP: Invalid input(s): POCBNP CBG: Recent Labs  Lab 06/05/21 0727 06/05/21 1133 06/05/21 1957 06/06/21 1201 06/06/21 1621  GLUCAP 116* 126* 114* 84 97   D-Dimer No results for input(s): DDIMER in the last 72 hours. Hgb A1c No results for input(s): HGBA1C in the last 72 hours. Lipid Profile No results for input(s): CHOL, HDL, LDLCALC, TRIG, CHOLHDL, LDLDIRECT in the last 72 hours. Thyroid function studies No results for input(s): TSH, T4TOTAL, T3FREE, THYROIDAB in the last 72 hours.  Invalid input(s): FREET3 Anemia work up Recent Labs    06/06/21 0600  FERRITIN 87  TIBC 311  IRON 13*   Urinalysis    Component Value Date/Time   COLORURINE YELLOW (A) 05/14/2021 2037   APPEARANCEUR CLEAR (A) 05/14/2021 2037   APPEARANCEUR Clear 08/10/2012 2024   LABSPEC 1.016 05/14/2021 2037   LABSPEC 1.002 08/10/2012 2024  PHURINE 6.0 05/14/2021 2037   GLUCOSEU NEGATIVE 05/14/2021 2037   GLUCOSEU Negative 08/10/2012 2024   HGBUR  SMALL (A) 05/14/2021 2037   BILIRUBINUR NEGATIVE 05/14/2021 2037   BILIRUBINUR Negative 08/10/2012 2024   KETONESUR 5 (A) 05/14/2021 2037   PROTEINUR NEGATIVE 05/14/2021 2037   NITRITE NEGATIVE 05/14/2021 2037   LEUKOCYTESUR NEGATIVE 05/14/2021 2037   LEUKOCYTESUR Trace 08/10/2012 2024   Sepsis Labs Invalid input(s): PROCALCITONIN,  WBC,  LACTICIDVEN Microbiology Recent Results (from the past 240 hour(s))  Resp Panel by RT-PCR (Flu A&B, Covid) Nasopharyngeal Swab     Status: None   Collection Time: 06/04/21 10:20 AM   Specimen: Nasopharyngeal Swab; Nasopharyngeal(NP) swabs in vial transport medium  Result Value Ref Range Status   SARS Coronavirus 2 by RT PCR NEGATIVE NEGATIVE Final    Comment: (NOTE) SARS-CoV-2 target nucleic acids are NOT DETECTED.  The SARS-CoV-2 RNA is generally detectable in upper respiratory specimens during the acute phase of infection. The lowest concentration of SARS-CoV-2 viral copies this assay can detect is 138 copies/mL. A negative result does not preclude SARS-Cov-2 infection and should not be used as the sole basis for treatment or other patient management decisions. A negative result may occur with  improper specimen collection/handling, submission of specimen other than nasopharyngeal swab, presence of viral mutation(s) within the areas targeted by this assay, and inadequate number of viral copies(<138 copies/mL). A negative result must be combined with clinical observations, patient history, and epidemiological information. The expected result is Negative.  Fact Sheet for Patients:  EntrepreneurPulse.com.au  Fact Sheet for Healthcare Providers:  IncredibleEmployment.be  This test is no t yet approved or cleared by the [place] FDA and  has been authorized for detection and/or diagnosis of SARS-CoV-2 by FDA under an Emergency Use Authorization (EUA). This EUA will remain  in effect (meaning this test  can be used) for the duration of the COVID-19 declaration under Section 564(b)(1) of the Act, 21 U.S.C.section 360bbb-3(b)(1), unless the authorization is terminated  or revoked sooner.       Influenza A by PCR NEGATIVE NEGATIVE Final   Influenza B by PCR NEGATIVE NEGATIVE Final    Comment: (NOTE) The Xpert Xpress SARS-CoV-2/FLU/RSV plus assay is intended as an aid in the diagnosis of influenza from Nasopharyngeal swab specimens and should not be used as a sole basis for treatment. Nasal washings and aspirates are unacceptable for Xpert Xpress SARS-CoV-2/FLU/RSV testing.  Fact Sheet for Patients: EntrepreneurPulse.com.au  Fact Sheet for Healthcare Providers: IncredibleEmployment.be  This test is not yet approved or cleared by the [place] FDA and has been authorized for detection and/or diagnosis of SARS-CoV-2 by FDA under an Emergency Use Authorization (EUA). This EUA will remain in effect (meaning this test can be used) for the duration of the COVID-19 declaration under Section 564(b)(1) of the Act, 21 U.S.C. section 360bbb-3(b)(1), unless the authorization is terminated or revoked.  Performed at [facility], [address], [place], [place] [address]   Surgical pcr screen     Status: None   Collection Time: 06/04/21  1:50 PM   Specimen: Nasal Mucosa; Nasal Swab  Result Value Ref Range Status   MRSA, PCR NEGATIVE NEGATIVE Final   Staphylococcus aureus NEGATIVE NEGATIVE Final    Comment: (NOTE) The Xpert SA Assay (FDA approved for NASAL specimens in patients 39 years of age and older), is one component of a comprehensive surveillance program. It is not intended to diagnose infection nor to guide or monitor treatment. Performed at  [facility], [address] [place], [place] [address]      Time coordinating discharge: Over 30 minutes  SIGNED:   Wyvonnia Dusky, MD  [facility] 06/07/2021,  11:17 AM Pager   If 7PM-7AM, please contact night-coverage

## 2021-06-07 NOTE — Progress Notes (Signed)
Subjective: 3 Days Post-Op Procedure(s) (LRB): ARTHROPLASTY BIPOLAR HIP (HEMIARTHROPLASTY) (Left) Patient reports pain as mild Patient's confusion has improved with tramadol only for pain. Plan ffor discharge to SNF today. Negative for chest pain and shortness of breath Fever: no Gastrointestinal:Negative for nausea and vomiting She is unable to tell me if she has had a BM.  Objective: Vital signs in last 24 hours: Temp:  [97.1 F (36.2 C)-98.7 F (37.1 C)] 97.1 F (36.2 C) (11/04 1143) Pulse Rate:  [87-103] 94 (11/04 1143) Resp:  [16-20] 16 (11/04 1143) BP: (122-150)/(57-93) 139/74 (11/04 1143) SpO2:  [92 %-98 %] 98 % (11/04 1143)  Intake/Output from previous day:  Intake/Output Summary (Last 24 hours) at 06/07/2021 1202 Last data filed at 06/07/2021 1018 Gross per 24 hour  Intake 1979.73 ml  Output 1200 ml  Net 779.73 ml    Intake/Output this shift: Total I/O In: 120 [P.O.:120] Out: -   Labs: Recent Labs    06/05/21 0514 06/06/21 0600 06/07/21 0911  HGB 9.7* 9.0* 9.5*   Recent Labs    06/06/21 0600 06/07/21 0911  WBC 11.3* 10.4  RBC 3.78* 3.90  HCT 30.7* 31.2*  PLT 247 270   Recent Labs    06/06/21 0600 06/06/21 1545 06/07/21 0631  NA 140  --  137  K 5.6* 4.8 4.1  CL 106  --  101  CO2 25  --  25  BUN 34*  --  33*  CREATININE 1.57*  --  1.31*  GLUCOSE 85  --  84  CALCIUM 8.8*  --  9.0   No results for input(s): LABPT, INR in the last 72 hours.   EXAM General - Patient is Alert and Confused, she knows she is in the hospital and remembers that she had a hip fracture but cant recall the injury. Extremity - ABD soft Neurovascular intact Dorsiflexion/Plantar flexion intact Incision: scant drainage No cellulitis present Dressing/Incision - Blood tinged drainage to the left hip this AM.  New honeycomb dressing applied. Motor Function - intact, moving foot and toes well on exam.  ABdomen soft with normal bowel sounds.  Past Medical History:   Diagnosis Date   Anxiety    Asthma    Cardiomyopathy ([place])    Cataract    COPD (chronic obstructive pulmonary disease) (HCC)    Degenerative arthritis    Depression    Diabetes ([place])    Eczema    Gout    Hemorrhoids    Hyperlipidemia    Hypertension    Nocturnal hypoxia    Osteoporosis    Pulmonary fibrosis (HCC)    Seasonal allergies    Shingles     Assessment/Plan: 3 Days Post-Op Procedure(s) (LRB): ARTHROPLASTY BIPOLAR HIP (HEMIARTHROPLASTY) (Left) Active Problems:   End stage COPD ([place])   Essential hypertension   Closed left hip fracture (HCC)  Estimated body mass index is 25.09 kg/m as calculated from the following:   Height as of this encounter: 5\' 8"  (1.727 m).   Weight as of this encounter: 74.8 kg. Advance diet Up with therapy  Labs reviewed, K+ 4.1 this AM,  Hg 9.5, mild bloody drainage noted.  New honeycomb dressing applied. Confusion improved today, continue tramadol for pain. Plan for d/c to SNF today.  Upon discharge continue Lovenox 30mg  twice daily for 14 days. Staple can be removed by SNF on 06/18/21 and follow-up with [facility] in 6 weeks.  DVT Prophylaxis - Lovenox and Foot Pumps Weight-Bearing as tolerated to left leg  J.  Horris Latino, PA-C [facility]opaedic Surgery 06/07/2021, 12:02 PM

## 2021-06-07 NOTE — TOC Progression Note (Signed)
Transition of Care [facility]) - Progression Note    Patient Details  Name: Brittany Munoz MRN: [id number] Date of Birth: [birth year]  Transition of Care [facility]) CM/SW Contact  Marlowe Sax, RN Phone Number: 06/07/2021, 1:34 PM  Clinical Narrative:    EMS to transport to [facility] room 703P, bedside nurse to call report and EMS to transport, she is on the EMS transport list to be picked up        Expected Discharge Plan and Services           Expected Discharge Date: 06/07/21                                     Social Determinants of Health (SDOH) Interventions    Readmission Risk Interventions No flowsheet data found.

## 2021-06-07 NOTE — Progress Notes (Signed)
Blood pressure 139/74, pulse 94, temperature (!) 97.1 F (36.2 C), temperature source Axillary, resp. rate 16, height 5\' 8"  (1.727 m), weight 74.8 kg, SpO2 98 %. IV cath removed site c/d/I. All pt belongings gathered up at bedside and sent with pt to facility, called report to facility pt going to rm 703p, pt transported via EMS to facility, family aware.

## 2021-06-07 NOTE — Care Management Important Message (Signed)
Important Message  Patient Details  Name: Brittany Munoz MRN: [id number] Date of Birth: 11/11/1927   Medicare Important Message Given:  Yes  I reviewed the Important Message from Medicare with patient's POA, Caran Storck [phone]) and she is in agreement with the discharge for today.  I asked her if she would like me to send her a copy and she relied, no that it wouldn't be necessary. I thanked her for time.     Olegario Messier A Doral Digangi 06/07/2021, 11:55 AM

## 2021-07-19 ENCOUNTER — Emergency Department (HOSPITAL_COMMUNITY): Payer: Medicare Other

## 2021-07-19 ENCOUNTER — Encounter (HOSPITAL_COMMUNITY): Payer: Self-pay | Admitting: Radiology

## 2021-07-19 ENCOUNTER — Other Ambulatory Visit: Payer: Self-pay

## 2021-07-19 ENCOUNTER — Observation Stay (HOSPITAL_COMMUNITY)
Admission: EM | Admit: 2021-07-19 | Discharge: 2021-07-20 | Disposition: A | Discharge status: Home / Self Care | Payer: Medicare Other | Attending: Internal Medicine | Admitting: Internal Medicine

## 2021-07-19 DIAGNOSIS — I10 Essential (primary) hypertension: Secondary | ICD-10-CM | POA: Diagnosis present

## 2021-07-19 DIAGNOSIS — E876 Hypokalemia: Secondary | ICD-10-CM | POA: Diagnosis not present

## 2021-07-19 DIAGNOSIS — Z96642 Presence of left artificial hip joint: Secondary | ICD-10-CM | POA: Insufficient documentation

## 2021-07-19 DIAGNOSIS — S32392A Other fracture of left ilium, initial encounter for closed fracture: Principal | ICD-10-CM | POA: Insufficient documentation

## 2021-07-19 DIAGNOSIS — E1122 Type 2 diabetes mellitus with diabetic chronic kidney disease: Secondary | ICD-10-CM | POA: Diagnosis not present

## 2021-07-19 DIAGNOSIS — I129 Hypertensive chronic kidney disease with stage 1 through stage 4 chronic kidney disease, or unspecified chronic kidney disease: Secondary | ICD-10-CM | POA: Insufficient documentation

## 2021-07-19 DIAGNOSIS — S32309A Unspecified fracture of unspecified ilium, initial encounter for closed fracture: Secondary | ICD-10-CM | POA: Diagnosis not present

## 2021-07-19 DIAGNOSIS — S329XXA Fracture of unspecified parts of lumbosacral spine and pelvis, initial encounter for closed fracture: Secondary | ICD-10-CM | POA: Diagnosis present

## 2021-07-19 DIAGNOSIS — J8410 Pulmonary fibrosis, unspecified: Secondary | ICD-10-CM | POA: Diagnosis present

## 2021-07-19 DIAGNOSIS — J449 Chronic obstructive pulmonary disease, unspecified: Secondary | ICD-10-CM | POA: Insufficient documentation

## 2021-07-19 DIAGNOSIS — I495 Sick sinus syndrome: Secondary | ICD-10-CM | POA: Diagnosis not present

## 2021-07-19 DIAGNOSIS — D649 Anemia, unspecified: Secondary | ICD-10-CM | POA: Insufficient documentation

## 2021-07-19 DIAGNOSIS — X58XXXA Exposure to other specified factors, initial encounter: Secondary | ICD-10-CM | POA: Diagnosis not present

## 2021-07-19 DIAGNOSIS — F0390 Unspecified dementia without behavioral disturbance: Secondary | ICD-10-CM | POA: Insufficient documentation

## 2021-07-19 DIAGNOSIS — Z20822 Contact with and (suspected) exposure to covid-19: Secondary | ICD-10-CM | POA: Insufficient documentation

## 2021-07-19 DIAGNOSIS — Z87891 Personal history of nicotine dependence: Secondary | ICD-10-CM | POA: Diagnosis not present

## 2021-07-19 DIAGNOSIS — Z79899 Other long term (current) drug therapy: Secondary | ICD-10-CM | POA: Diagnosis not present

## 2021-07-19 DIAGNOSIS — J45909 Unspecified asthma, uncomplicated: Secondary | ICD-10-CM | POA: Insufficient documentation

## 2021-07-19 DIAGNOSIS — E441 Mild protein-calorie malnutrition: Secondary | ICD-10-CM | POA: Diagnosis present

## 2021-07-19 DIAGNOSIS — S32309S Unspecified fracture of unspecified ilium, sequela: Secondary | ICD-10-CM

## 2021-07-19 DIAGNOSIS — E119 Type 2 diabetes mellitus without complications: Secondary | ICD-10-CM

## 2021-07-19 DIAGNOSIS — Z85828 Personal history of other malignant neoplasm of skin: Secondary | ICD-10-CM | POA: Insufficient documentation

## 2021-07-19 DIAGNOSIS — N1831 Chronic kidney disease, stage 3a: Secondary | ICD-10-CM | POA: Diagnosis not present

## 2021-07-19 DIAGNOSIS — J9611 Chronic respiratory failure with hypoxia: Secondary | ICD-10-CM | POA: Insufficient documentation

## 2021-07-19 DIAGNOSIS — M25552 Pain in left hip: Secondary | ICD-10-CM

## 2021-07-19 DIAGNOSIS — R52 Pain, unspecified: Secondary | ICD-10-CM

## 2021-07-19 LAB — URINALYSIS, ROUTINE W REFLEX MICROSCOPIC
Bilirubin Urine: NEGATIVE
Glucose, UA: NEGATIVE mg/dL
Hgb urine dipstick: NEGATIVE
Ketones, ur: NEGATIVE mg/dL
Nitrite: NEGATIVE
Protein, ur: NEGATIVE mg/dL
Specific Gravity, Urine: 1.02 (ref 1.005–1.030)
pH: 6 (ref 5.0–8.0)

## 2021-07-19 LAB — COMPREHENSIVE METABOLIC PANEL
ALT: 6 U/L (ref 0–44)
AST: 14 U/L — ABNORMAL LOW (ref 15–41)
Albumin: 3.2 g/dL — ABNORMAL LOW (ref 3.5–5.0)
Alkaline Phosphatase: 65 U/L (ref 38–126)
Anion gap: 11 (ref 5–15)
BUN: 14 mg/dL (ref 8–23)
CO2: 28 mmol/L (ref 22–32)
Calcium: 8.8 mg/dL — ABNORMAL LOW (ref 8.9–10.3)
Chloride: 100 mmol/L (ref 98–111)
Creatinine, Ser: 0.71 mg/dL (ref 0.44–1.00)
GFR, Estimated: >60 mL/min (ref >60)
Glucose, Bld: 91 mg/dL (ref 70–99)
Potassium: 3.1 mmol/L — ABNORMAL LOW (ref 3.5–5.1)
Sodium: 139 mmol/L (ref 135–145)
Total Bilirubin: 0.4 mg/dL (ref 0.3–1.2)
Total Protein: 6.2 g/dL — ABNORMAL LOW (ref 6.5–8.1)

## 2021-07-19 LAB — CBC
HCT: 35.3 % — ABNORMAL LOW (ref 36.0–46.0)
Hemoglobin: 10.5 g/dL — ABNORMAL LOW (ref 12.0–15.0)
MCH: 24.5 pg — ABNORMAL LOW (ref 26.0–34.0)
MCHC: 29.7 g/dL — ABNORMAL LOW (ref 30.0–36.0)
MCV: 82.3 fL (ref 80.0–100.0)
Platelets: 472 10*3/uL — ABNORMAL HIGH (ref 150–400)
RBC: 4.29 MIL/uL (ref 3.87–5.11)
RDW: 15.9 % — ABNORMAL HIGH (ref 11.5–15.5)
WBC: 9.5 10*3/uL (ref 4.0–10.5)
nRBC: 0 % (ref 0.0–0.2)

## 2021-07-19 LAB — CBG MONITORING, ED
Glucose-Capillary: 87 mg/dL (ref 70–99)
Glucose-Capillary: 87 mg/dL (ref 70–99)

## 2021-07-19 LAB — GLUCOSE, CAPILLARY: Glucose-Capillary: 120 mg/dL — ABNORMAL HIGH (ref 70–99)

## 2021-07-19 LAB — RESP PANEL BY RT-PCR (FLU A&B, COVID) ARPGX2
Influenza A by PCR: NEGATIVE
Influenza B by PCR: NEGATIVE
SARS Coronavirus 2 by RT PCR: NEGATIVE

## 2021-07-19 MED ORDER — ONDANSETRON HCL 4 MG/2ML IJ SOLN
4.0000 mg | Freq: Once | INTRAMUSCULAR | Status: AC
  Administered 2021-07-19: 4 mg via INTRAVENOUS
  Filled 2021-07-19: qty 2

## 2021-07-19 MED ORDER — POTASSIUM CHLORIDE CRYS ER 10 MEQ PO TBCR
20.0000 meq | EXTENDED_RELEASE_TABLET | Freq: Once | ORAL | Status: AC
  Administered 2021-07-19: 20 meq via ORAL
  Filled 2021-07-19: qty 2

## 2021-07-19 MED ORDER — INSULIN ASPART 100 UNIT/ML IJ SOLN
0.0000 [IU] | Freq: Three times a day (TID) | INTRAMUSCULAR | Status: DC
Start: 2021-07-19 — End: 2021-07-20
  Administered 2021-07-20: 1 [IU] via SUBCUTANEOUS
  Filled 2021-07-19: qty 0.09

## 2021-07-19 MED ORDER — SODIUM CHLORIDE 0.9 % IV BOLUS
500.0000 mL | Freq: Once | INTRAVENOUS | Status: AC
  Administered 2021-07-19: 500 mL via INTRAVENOUS

## 2021-07-19 MED ORDER — FERROUS GLUCONATE 324 (38 FE) MG PO TABS
324.0000 mg | ORAL_TABLET | ORAL | Status: DC
  Administered 2021-07-20: 324 mg via ORAL
  Filled 2021-07-19: qty 1

## 2021-07-19 MED ORDER — MONTELUKAST SODIUM 10 MG PO TABS
10.0000 mg | ORAL_TABLET | Freq: Every day | ORAL | Status: DC
  Administered 2021-07-19: 10 mg via ORAL
  Filled 2021-07-19: qty 1

## 2021-07-19 MED ORDER — UMECLIDINIUM BROMIDE 62.5 MCG/ACT IN AEPB
1.0000 | INHALATION_SPRAY | Freq: Every day | RESPIRATORY_TRACT | Status: DC
  Administered 2021-07-20: 1 via RESPIRATORY_TRACT
  Filled 2021-07-19: qty 7

## 2021-07-19 MED ORDER — ALBUTEROL SULFATE (2.5 MG/3ML) 0.083% IN NEBU: 3.0000 mL | INHALATION_SOLUTION | RESPIRATORY_TRACT | Status: DC | PRN

## 2021-07-19 MED ORDER — ONDANSETRON HCL 4 MG PO TABS: 4.0000 mg | ORAL_TABLET | Freq: Four times a day (QID) | ORAL | Status: DC | PRN

## 2021-07-19 MED ORDER — OXYCODONE HCL 5 MG PO TABS
2.5000 mg | ORAL_TABLET | ORAL | Status: DC | PRN
  Administered 2021-07-19 – 2021-07-20 (×3): 2.5 mg via ORAL
  Filled 2021-07-19 (×3): qty 1

## 2021-07-19 MED ORDER — PANTOPRAZOLE SODIUM 40 MG PO TBEC
40.0000 mg | DELAYED_RELEASE_TABLET | Freq: Every day | ORAL | Status: DC
  Administered 2021-07-20: 40 mg via ORAL
  Filled 2021-07-19: qty 1

## 2021-07-19 MED ORDER — PANTOPRAZOLE SODIUM 40 MG IV SOLR
40.0000 mg | Freq: Once | INTRAVENOUS | Status: AC
  Administered 2021-07-19: 40 mg via INTRAVENOUS
  Filled 2021-07-19: qty 40

## 2021-07-19 MED ORDER — HYDROMORPHONE HCL 1 MG/ML IJ SOLN
0.5000 mg | Freq: Once | INTRAMUSCULAR | Status: AC
  Administered 2021-07-19: 0.5 mg via INTRAVENOUS
  Filled 2021-07-19: qty 1

## 2021-07-19 MED ORDER — TORSEMIDE 10 MG PO TABS
10.0000 mg | ORAL_TABLET | Freq: Every day | ORAL | Status: DC
  Filled 2021-07-19: qty 1

## 2021-07-19 MED ORDER — LORAZEPAM 1 MG PO TABS
2.0000 mg | ORAL_TABLET | Freq: Every day | ORAL | Status: DC
  Administered 2021-07-19: 2 mg via ORAL
  Filled 2021-07-19: qty 2

## 2021-07-19 MED ORDER — AMLODIPINE BESYLATE 5 MG PO TABS
5.0000 mg | ORAL_TABLET | Freq: Every day | ORAL | Status: DC
  Administered 2021-07-19 – 2021-07-20 (×2): 5 mg via ORAL
  Filled 2021-07-19 (×2): qty 1

## 2021-07-19 MED ORDER — ACETAMINOPHEN 325 MG PO TABS: 650.0000 mg | ORAL_TABLET | Freq: Four times a day (QID) | ORAL | Status: DC | PRN

## 2021-07-19 MED ORDER — ACETAMINOPHEN 500 MG PO TABS
1000.0000 mg | ORAL_TABLET | Freq: Once | ORAL | Status: AC
  Administered 2021-07-19: 1000 mg via ORAL
  Filled 2021-07-19: qty 2

## 2021-07-19 MED ORDER — POTASSIUM CHLORIDE 10 MEQ/100ML IV SOLN
10.0000 meq | Freq: Once | INTRAVENOUS | Status: AC
  Administered 2021-07-19: 10 meq via INTRAVENOUS
  Filled 2021-07-19: qty 100

## 2021-07-19 MED ORDER — ONDANSETRON HCL 4 MG/2ML IJ SOLN: 4.0000 mg | Freq: Four times a day (QID) | INTRAMUSCULAR | Status: DC | PRN

## 2021-07-19 MED ORDER — BUDESONIDE 0.25 MG/2ML IN SUSP
0.2500 mg | Freq: Two times a day (BID) | RESPIRATORY_TRACT | Status: DC
  Administered 2021-07-19 – 2021-07-20 (×2): 0.25 mg via RESPIRATORY_TRACT
  Filled 2021-07-19 (×2): qty 2

## 2021-07-19 MED ORDER — DULOXETINE HCL 60 MG PO CPEP
60.0000 mg | ORAL_CAPSULE | Freq: Every day | ORAL | Status: DC
  Administered 2021-07-19 – 2021-07-20 (×2): 60 mg via ORAL
  Filled 2021-07-19 (×2): qty 1

## 2021-07-19 MED ORDER — METOPROLOL SUCCINATE ER 25 MG PO TB24
25.0000 mg | ORAL_TABLET | Freq: Every day | ORAL | Status: DC
  Administered 2021-07-19 – 2021-07-20 (×2): 25 mg via ORAL
  Filled 2021-07-19 (×2): qty 1

## 2021-07-19 MED ORDER — ACETAMINOPHEN 650 MG RE SUPP: 650.0000 mg | Freq: Four times a day (QID) | RECTAL | Status: DC | PRN

## 2021-07-19 MED ORDER — GUAIFENESIN ER 600 MG PO TB12: 600.0000 mg | ORAL_TABLET | Freq: Two times a day (BID) | ORAL | Status: DC

## 2021-07-19 NOTE — ED Provider Notes (Signed)
[facility]-EMERGENCY DEPT [facility] Emergency Department Provider Note MRN:  [id number]  Arrival date & time: 07/19/21     Chief Complaint   Hip Pain   History of Present Illness   Brittany Munoz is a 87 y.o. year-old female with a history of hypertension, COPD, cardiomyopathy presenting to the ED with chief complaint of hip pain.  Location: Left hip Duration: A few hours Onset: Gradual Timing: Constant Description: Dull ache Severity: Moderate to severe Exacerbating/Alleviating Factors: Worse with motion Associated Symptoms: None Pertinent Negatives: No fever, no trauma, no chest pain, no abdominal pain  Additional History: Recent hip replacement in this hip.   Review of Systems  A complete 10 system review of systems was obtained and all systems are negative except as noted in the HPI and PMH.   Patient's Health History    Past Medical History:  Diagnosis Date   Anxiety    Asthma    Cardiomyopathy (HCC)    Cataract    COPD (chronic obstructive pulmonary disease) (HCC)    Degenerative arthritis    Depression    Diabetes (HCC)    Eczema    Gout    Hemorrhoids    Hyperlipidemia    Hypertension    Nocturnal hypoxia    Osteoporosis    Pulmonary fibrosis (HCC)    Seasonal allergies    Shingles     Past Surgical History:  Procedure Laterality Date   BLADDER REPAIR     BREAST CYST REMOVAL     BROKEN ANKLE     CARDIAC CATHETERIZATION     CHOLECYSTECTOMY     HIP ARTHROPLASTY Left 06/04/2021   Procedure: ARTHROPLASTY BIPOLAR HIP (HEMIARTHROPLASTY);  Surgeon: Christena Flake, MD;  Location: [facility] ORS;  Service: Orthopedics;  Laterality: Left;   JOINT REPLACEMENT     KNEE ARTHROSCOPY     MOUTH SURGERY     OPEN REDUCTION INTERNAL FIXATION (ORIF) DISTAL RADIAL FRACTURE     REPAIR ROTATOR CUFF TEAR     SKIN CANCER REMOVED     RT KNEE AND FOREHEAD   TONSILLECTOMY AND ADENOIDECTOMY     WISDOM TOOTH REMOVAL      Family History  Problem Relation Age of Onset    Hypertension Mother    Asthma Mother    COPD Mother    Arthritis Mother    Colon cancer Father    Arthritis Sister    Prostate cancer Neg Hx    Bladder Cancer Neg Hx    Kidney cancer Neg Hx     Social History   Socioeconomic History   Marital status: Widowed    Spouse name: Not on file   Number of children: Not on file   Years of education: Not on file   Highest education level: Not on file  Occupational History   Not on file  Tobacco Use   Smoking status: Former    Packs/day: 2.00    Years: 25.00    Pack years: 50.00    Types: Cigarettes    Quit date: 12/16/1963    Years since quitting: 57.6   Smokeless tobacco: Never  Substance and Sexual Activity   Alcohol use: No    Alcohol/week: 0.0 standard drinks   Drug use: No   Sexual activity: Not on file  Other Topics Concern   Not on file  Social History Narrative   Not on file   Social Determinants of Health   Financial Resource Strain: Not on file  Food Insecurity: Not on  file  Transportation Needs: Not on file  Physical Activity: Not on file  Stress: Not on file  Social Connections: Not on file  Intimate Partner Violence: Not on file     Physical Exam   Vitals:   07/19/21 0417 07/19/21 0532  BP: 131/71 (!) 134/57  Pulse: 96 83  Resp: 16 16  Temp:    SpO2: 96% 97%    CONSTITUTIONAL: Well-appearing, NAD NEURO:  Alert and oriented x 3, no focal deficits EYES:  eyes equal and reactive ENT/NECK:  no LAD, no JVD CARDIO: Regular rate, well-perfused, normal S1 and S2 PULM:  CTAB no wheezing or rhonchi GI/GU:  normal bowel sounds, non-distended, non-tender MSK/SPINE:  No gross deformities, no edema, decreased range of motion of the left hip due to pain SKIN:  no rash, atraumatic PSYCH:  Appropriate speech and behavior  *Additional and/or pertinent findings included in MDM below  Diagnostic and Interventional Summary    EKG Interpretation  Date/Time:    Ventricular Rate:    PR Interval:    QRS  Duration:   QT Interval:    QTC Calculation:   R Axis:     Text Interpretation:         Labs Reviewed  CBC - Abnormal; Notable for the following components:      Result Value   Hemoglobin 10.5 (*)    HCT 35.3 (*)    MCH 24.5 (*)    MCHC 29.7 (*)    RDW 15.9 (*)    Platelets 472 (*)    All other components within normal limits  COMPREHENSIVE METABOLIC PANEL - Abnormal; Notable for the following components:   Potassium 3.1 (*)    Calcium 8.8 (*)    Total Protein 6.2 (*)    Albumin 3.2 (*)    AST 14 (*)    All other components within normal limits  URINALYSIS, ROUTINE W REFLEX MICROSCOPIC    DG HIP UNILAT WITH PELVIS 2-3 VIEWS LEFT  Final Result    CT ABDOMEN PELVIS WO CONTRAST    (Results Pending)    Medications  acetaminophen (TYLENOL) tablet 1,000 mg (has no administration in time range)     Procedures  /  Critical Care Procedures  ED Course and Medical Decision Making  I have reviewed the triage vital signs, the nursing notes, and pertinent available records from the EMR.  Listed above are laboratory and imaging tests that I personally ordered, reviewed, and interpreted and then considered in my medical decision making (see below for details).  Nontraumatic left hip pain.  Has some range of motion, normal vital signs, no erythema or increased warmth, doubt septic joint.  Awaiting x-ray.     X-ray unremarkable.  Patient with continued pain, seems more in the left flank.  Unable to reach family by phone for further history.  Will obtain CT to exclude renal or vascular etiology.  States that she has not been ambulatory for a few months.  If reassuring labs, urine, CT, would likely be appropriate for discharge.  No symptoms to suggest myelopathy.  Elmer Sow. Pilar Plate, MD [facility] Emergency Medicine [facility] [email or web address]  Final Clinical Impressions(s) / ED Diagnoses     ICD-10-CM   1. Left hip pain  M25.552     2. Pain  R52 DG HIP  UNILAT WITH PELVIS 2-3 VIEWS LEFT    DG HIP UNILAT WITH PELVIS 2-3 VIEWS LEFT      ED Discharge Orders  None        Discharge Instructions Discussed with and Provided to Patient:   Discharge Instructions   None       Sabas Sous, MD 07/19/21 [phone]

## 2021-07-19 NOTE — ED Triage Notes (Signed)
Pt states that her left hip hurts but she is pointing and rubbing her left buttock. Pt states she does physical therapy at her rehab facility daily.

## 2021-07-19 NOTE — ED Notes (Signed)
The POA, Susann Givens would like an update from the RN.

## 2021-07-19 NOTE — H&P (Signed)
History and Physical    Brittany Munoz [id number] DOB: [birth year] DOA: 07/19/2021  PCP: Leonel Ramsay, MD   Patient coming from: SNF.  I have personally briefly reviewed patient's old medical records in [place]  Chief Complaint: Fall.  HPI: Brittany Munoz is a 87 y.o. female with medical history significant of anxiety, asthma, nonspecified cardiomyopathy, cataracts, COPD, osteoarthritis, depression, type II DM, eczema, gout, hemorrhoids, hyperlipidemia, hypertension, nocturnal hypoxia, osteoporosis, pulmonary fibrosis, seasonal allergies, varicella-zoster who is coming to the emergency department from her facility due to left hip pain since yesterday.  She is oriented to self, place and situation.  Disoriented to time and date.  She is not sure if she had a fall or not.  She stated that she has been getting physical therapy at her facility, but is unable to elaborate.  She is able to answer simple questions and denied headache, abdominal, back or chest pain.  ED Course: Initial vital signs were temperature 97.5 F, pulse 92, respirations 16, BP 142/72 mmHg O2 sat 98% on room air.  The patient received a 500 mL NS bolus and 10 mEq of KCl IVPB.  Lab work: Her urinalysis shows trace leukocyte esterase and rare bacteria.  Coronavirus and influenza PCR was negative.  CBC showed a white count 9.5, hemoglobin 10.5 g/dL platelets 472.  CMP showed a potassium of 3.1 mmol/L, all other electrolytes were normal.  Glucose and renal functions was normal.  Total protein 6.2 and albumin 3.2 g/dL.  The rest of the LFTs were unremarkable.  Imaging: Left hip x-ray show osteopenia without evidence of loosening, dislocation or periprosthetic fracture.  CT abdomen/pelvis without contrast show a new oblique fracture that extends through the posterior lower left ilium into the posterior root for the left acetabulum with slight displacement.  There is no visible healing reaction at this time.  There  were probably diffuse gastritis.  There was constipation with diverticulosis.  There was asymmetric right lower lobe bronchial thickening with posterior basilar opacity with bronchiectasis consistent with localized fibrosis and possible chronic aspiration.  There was aortic atherosclerosis.  Please see images and full radiology report for further details.  Review of Systems: As per HPI otherwise all other systems reviewed and are negative.  Past Medical History:  Diagnosis Date   Anxiety    Asthma    Cardiomyopathy ([place])    Cataract    COPD (chronic obstructive pulmonary disease) (HCC)    Degenerative arthritis    Depression    Diabetes ([place])    Eczema    Gout    Hemorrhoids    Hyperlipidemia    Hypertension    Nocturnal hypoxia    Osteoporosis    Pulmonary fibrosis ([place])    Seasonal allergies    Shingles    Past Surgical History:  Procedure Laterality Date   BLADDER REPAIR     BREAST CYST REMOVAL     BROKEN ANKLE     CARDIAC CATHETERIZATION     CHOLECYSTECTOMY     HIP ARTHROPLASTY Left 06/04/2021   Procedure: ARTHROPLASTY BIPOLAR HIP (HEMIARTHROPLASTY);  Surgeon: Corky Mull, MD;  Location: [facility] ORS;  Service: Orthopedics;  Laterality: Left;   JOINT REPLACEMENT     KNEE ARTHROSCOPY     MOUTH SURGERY     OPEN REDUCTION INTERNAL FIXATION (ORIF) DISTAL RADIAL FRACTURE     REPAIR ROTATOR CUFF TEAR     SKIN CANCER REMOVED     RT KNEE AND FOREHEAD   TONSILLECTOMY  AND ADENOIDECTOMY     WISDOM TOOTH REMOVAL     Social History  reports that she quit smoking about 57 years ago. Her smoking use included cigarettes. She has a 50.00 pack-year smoking history. She has never used smokeless tobacco. She reports that she does not drink alcohol and does not use drugs.  Allergies  Allergen Reactions   Aspirin Anaphylaxis   Cefuroxime Axetil Swelling    TOLERATED CEFAZOLIN 06/04/21   Moxifloxacin Hcl In Nacl     Other reaction(s): Unknown   Oxycodone Nausea Only    Sulfamethoxazole-Trimethoprim     Other reaction(s): Unknown   Tramadol     Other reaction(s): Unknown   Family History  Problem Relation Age of Onset   Hypertension Mother    Asthma Mother    COPD Mother    Arthritis Mother    Colon cancer Father    Arthritis Sister    Prostate cancer Neg Hx    Bladder Cancer Neg Hx    Kidney cancer Neg Hx    Prior to Admission medications   Medication Sig Start Date End Date Taking? Authorizing Provider  acetaminophen (TYLENOL) 650 MG CR tablet Take 1,300 mg by mouth 2 (two) times daily.    [provider]  amLODipine (NORVASC) 5 MG tablet Take 5 mg by mouth daily. 05/01/21   [provider]  budesonide (PULMICORT) 0.25 MG/2ML nebulizer solution Inhale 2 mLs into the lungs 2 (two) times daily as needed for wheezing or shortness of breath. 09/17/20   [provider]  cyanocobalamin (,VITAMIN B-12,) 1000 MCG/ML injection Inject 1,000 mcg into the muscle every 30 (thirty) days. 05/19/16   [provider]  dicyclomine (BENTYL) 10 MG capsule Take 10 mg by mouth 2 (two) times daily as needed. 01/24/20   [provider]  DULoxetine (CYMBALTA) 60 MG capsule Take 60 mg by mouth daily. 10/18/14   [provider]  enoxaparin (LOVENOX) 30 MG/0.3ML injection Inject 0.3 mLs (30 mg total) into the skin every 12 (twelve) hours for 14 days. 06/07/21 06/21/21  Anson [place], PA-C  ferrous gluconate (FERGON) 240 (27 FE) MG tablet Take 1 tablet (240 mg total) by mouth 3 (three) times daily with meals. 06/07/21   Charise Killian, MD  guaiFENesin (MUCINEX) 600 MG 12 hr tablet Take 600 mg by mouth 2 times daily at 12 noon and 4 pm.    [provider]  LORazepam (ATIVAN) 1 MG tablet Take 2 mg by mouth at bedtime.    [provider]  metoprolol succinate (TOPROL-XL) 25 MG 24 hr tablet Take 25 mg by mouth daily. 11/02/14 05/14/21  [provider]  montelukast (SINGULAIR) 10 MG tablet Take 10 mg  by mouth daily. 10/18/14   [provider]  omeprazole (PRILOSEC) 40 MG capsule Take 40 mg by mouth daily. 10/18/14   [provider]  PROAIR HFA 108 (90 Base) MCG/ACT inhaler USE 2 PUFFS EVERY 4 HOURS  AS NEEDED 11/03/18   Erin Fulling, MD  rOPINIRole (REQUIP) 0.5 MG tablet Take 0.5 mg by mouth 2 (two) times daily. 04/30/21   [provider]  torsemide (DEMADEX) 20 MG tablet Take 20 mg by mouth daily. 07/06/14   [provider]  Vitamin D, Ergocalciferol, (DRISDOL) 50000 UNITS CAPS capsule Take 1 capsule by mouth once a week. 12/20/14   [provider]   Physical Exam: Vitals:   07/19/21 0642 07/19/21 0643 07/19/21 0700 07/19/21 0930  BP:   (!) 142/131 [name])  149/73  Pulse: 92 91 99 85  Resp: 19 (!) 21 20 (!) 25  Temp:      TempSrc:      SpO2: 98% 97% 99% 100%  Weight:      Height:       Constitutional: Frail elderly female.  NAD, calm, comfortable Eyes: PERRL, lids and conjunctivae normal ENMT: Nasal cannula in place.  Mucous membranes are moist. Posterior pharynx clear of any exudate or lesions. Neck: normal, supple, no masses, no thyromegaly Respiratory: Scattered bilateral crackles.  No wheezing.  Normal respiratory effort. No accessory muscle use.  Cardiovascular: Regular rate and rhythm, no murmurs / rubs / gallops. No extremity edema. 2+ pedal pulses. No carotid bruits.  Abdomen: No distention.  Soft, no tenderness, no masses palpated. No hepatosplenomegaly. Bowel sounds positive.  Musculoskeletal: Moderate generalized weakness.  No clubbing / cyanosis.  Left hip tenderness and decreased ROM, no contractures. Normal muscle tone.  Skin: no rashes, lesions, ulcers on limited dermatological examination. Neurologic: CN 2-12 grossly intact. Sensation intact, DTR normal. Strength 5/5 in all 4.  Psychiatric: Normal judgment and insight. Alert and oriented x 3. Normal mood.   Labs on Admission: I have personally reviewed following labs and imaging  studies  CBC: Recent Labs  Lab 07/19/21 0210  WBC 9.5  HGB 10.5*  HCT 35.3*  MCV 82.3  PLT [id number]*   Basic Metabolic Panel: Recent Labs  Lab 07/19/21 0210  NA 139  K 3.1*  CL 100  CO2 28  GLUCOSE 91  BUN 14  CREATININE 0.71  CALCIUM 8.8*   GFR: Estimated Creatinine Clearance: 46.2 mL/min (by C-G formula based on SCr of 0.71 mg/dL).  Liver Function Tests: Recent Labs  Lab 07/19/21 0210  AST 14*  ALT 6  ALKPHOS 65  BILITOT 0.4  PROT 6.2*  ALBUMIN 3.2*   Urine analysis:    Component Value Date/Time   COLORURINE YELLOW 07/19/2021 0756   APPEARANCEUR CLEAR 07/19/2021 0756        LABSPEC 1.020 07/19/2021 0756        PHURINE 6.0 07/19/2021 0756   GLUCOSEU NEGATIVE 07/19/2021 0756        HGBUR NEGATIVE 07/19/2021 0756   BILIRUBINUR NEGATIVE 07/19/2021 0756        KETONESUR NEGATIVE 07/19/2021 0756   PROTEINUR NEGATIVE 07/19/2021 0756   NITRITE NEGATIVE 07/19/2021 0756   LEUKOCYTESUR TRACE (A) 07/19/2021 0756        Radiological Exams on Admission: CT ABDOMEN PELVIS WO CONTRAST  Result Date: 07/19/2021 CLINICAL DATA:  Flank pain, left hip pain. EXAM: CT ABDOMEN AND PELVIS WITHOUT CONTRAST TECHNIQUE: Multidetector CT imaging of the abdomen and pelvis was performed following the standard protocol without IV contrast. COMPARISON:  Pelvic CT no contrast 05/14/2021, CT abdomen and pelvis with contrast 09/09/2016. FINDINGS: Factors affecting image quality: Respiratory motion artifact in the patient's arms in the field limited assessment of abdominal structures; abundant spray artifact from a left hip arthroplasty. Lower chest: There is subpleural opacity with bronchiolectasis in the posterior basal right lower lobe consistent with fibrosis and/or chronic aspiration. There is asymmetric thickening of the right lower lobe second and third order bronchi. Scattered linear scar-like opacities. Mild cardiomegaly without pericardial effusion. Hepatobiliary: Not well seen due to  breathing motion. No obvious liver mass. Gallbladder again noted absent without biliary dilatation. Pancreas: Not well evaluated without contrast. No obvious mass or ductal dilatation. Spleen: No mass or splenomegaly is seen without contrast. Adrenals/Urinary Tract: There is no adrenal mass. Cortical  scarring again noted superior pole right kidney. There are small renal cysts. There are no stones or hydronephrosis. Distal left ureter is not well seen due to artifact from a left hip arthroplasty newly installed since 05/14/2021 with operative date 06/04/2021. The lower half of the bladder is also obscured by the hip replacement. Stomach/Bowel: There is moderate circumferential gastric thickening. Probable diffuse gastritis. No small bowel obstruction or inflammation. An appendix is not seen in this patient. There is constipation ascending and transverse colon, left colonic diverticulosis without colitis or diverticulitis. Vascular/Lymphatic: Aortic atherosclerosis. No enlarged abdominal or pelvic lymph nodes. Reproductive: The uterus is absent.  No adnexal lesion is seen. Other: No free air, hemorrhage or fluid. Musculoskeletal: There is a minimally displaced oblique fracture of the posterior lower left ilium extending through the posterior roof of the left acetabulum. Hemarthrosis is likely present in the left hip joint space but is obscured by the left hip arthroplasty. There is stranding in the adjacent soft tissues but no space-occupying intramuscular hematoma. There are no further displaced fractures. There is osteopenia. Advanced degenerative arthrosis is again noted of the right hip. There is no lumbar spinal compression injury. Facet hypertrophy lower lumbar spine is seen most advanced at L4-5. IMPRESSION: 1. Not seen on plain films earlier today, and new compared to 05/14/2021, an oblique fracture extends through the posterior lower left ilium into the posterior roof of the left acetabulum with slight  displacement. There is no visible healing reaction at this time. There is stranding in the adjacent soft tissues but there is no pelvic wall hematoma. There is abundant metal artifact from a left hip arthroplasty placed 06/04/2021. 2. No evidence of urinary stones or obstruction. 3. Probable diffuse gastritis. 4. Constipation with diverticulosis. 5. Asymmetric right lower lobe bronchial thickening with posterior basal opacity with bronchiolectasis consistent with localized fibrosis and possible chronic aspiration. 6. Aortic atherosclerosis and additional findings discussed above. Electronically Signed   By: Almira BarKeith  Chesser M.D.   On: 07/19/2021 06:39   DG HIP UNILAT WITH PELVIS 2-3 VIEWS LEFT  Result Date: 07/19/2021 CLINICAL DATA:  Increased hip pain. EXAM: DG HIP (WITH OR WITHOUT PELVIS) 2-3V LEFT COMPARISON:  Postoperative films following left hip replacement 06/04/2021. FINDINGS: Left hip arthroplasty with cemented femoral component is again noted without evidence of loosening or dislocation, or periprosthetic fractures. The bones are diffusely demineralized. Advanced bone-on-bone right hip DJD is again shown with acetabular and femoral head osteophytes. There is no widening at the SI joints and pubic symphysis. There have been no significant interval changes. IMPRESSION: Osteopenia with left total joint arthroplasty without evidence of loosening, dislocation or periprosthetic fracture. Advanced right hip DJD. Electronically Signed   By: Almira BarKeith  Chesser M.D.   On: 07/19/2021 04:25    EKG: Independently reviewed.   Assessment/Plan Principal Problem:   Closed fracture of ilium, unspecified fracture morphology, sequela Observation/telemetry. Continue analgesics as needed. Orthopedic surgery (Dr Joice LoftsPoggi) was contacted by Dr. Wallace CullensGray.   He recommended 50% weight bearing if possible. He also stated WBAT would be okay. She will need an x-ray in 1 week. Dr. Binnie RailPoggi's office will arrange for follow-up Patient  may be able to go back to SNF tomorrow.  Active Problems:   Normocytic anemia Monitor H&H. Transfuse as needed.    Essential hypertension Continue amlodipine 5 mg p.o. daily. Continue metoprolol succinate 25 mg p.o. daily. Monitor BP and heart rate.    Chronic respiratory failure with hypoxia (HCC) In the setting of   Pulmonary fibrosis (HCC)  Continue Pulmicort and bronchodilators.    Tachy-brady syndrome (HCC) Continue Toprol-XL 25 mg p.o. daily.    Stage 3a chronic kidney disease (HCC) Monitor renal function electrolytes.    Type 2 diabetes mellitus without complication,    without long-term current use of insulin (HCC) Carbohydrate modified diet. CBG monitoring with RI SS.    Hypokalemia Replacing. Follow potassium level.    Mild protein malnutrition (HCC) Protein supplementation. Nutritional services evaluation her facility.    DVT prophylaxis: SCDs. Code Status:   Full code. Family Communication:  Unable to reach family. Disposition Plan:   Patient is from:  SNF.  Anticipated DC to:  SNF.  Anticipated DC date:  07/20/2021 or 07/21/2021.  Anticipated DC barriers: Clinical status.  Consults called:  PT evaluation in AM. Admission status:  Observation/telemetry.   Severity of Illness: Moderate severity due to pain and disability.  The patient will remain for 24 hours for pain control.  Reubin Milan MD [facility]  How to contact the [facility] Attending or Consulting provider [place] or covering provider during after hours [place], for this patient?   Check the care team in [facility] and look for a) attending/consulting [facility] provider listed and b) the [facility] team listed Log into [email or web address] and use [place] universal password to access. If you do not have the password, please contact the hospital operator. Locate the [facility] provider you are looking for under [facility] and page to a number that you can be directly reached. If you still have  difficulty reaching the provider, please page the [facility] (Director on Call) for the Hospitalists listed on amion for assistance.  07/19/2021, 11:28 AM   This document was prepared in [employer] and may contain some unintended transcription errors.

## 2021-07-19 NOTE — ED Provider Notes (Signed)
°  Provider Note MRN:  [id number]  Arrival date & time: 07/19/21    ED Course and Medical Decision Making  Assumed care from [facility] at shift change.  See not from prior team for complete details, in brief: 87 yo female from nursing home w/ hx dementia. Recent hip arthorplasty Dr Joice Lofts [facility]. Today with pelvic fracture ipsilateral to prior arthroplasty, unclear how the trauma occurred. Plan for admission  D/w on call ortho here who recommends we discuss with Dr Binnie Rail team who performed recent procedure last month. D/w Dr Binnie Rail PA who after discussion with their on call ortho recommends WB @ 50%, and f/u in the office 1wk after discharge for repeat XR and rpt eval. WBAT would be okay given her underlying dementia. Dr Poggi's team feels the fracture is very stable and is unlikely to require intervention.  Accepted by hospitalist for admission. Dr Robb Matar  Procedures  Final Clinical Impressions(s) / ED Diagnoses     ICD-10-CM   1. Left hip pain  M25.552     2. Pain  R52 DG HIP UNILAT WITH PELVIS 2-3 VIEWS LEFT    DG HIP UNILAT WITH PELVIS 2-3 VIEWS LEFT    3. Closed displaced fracture of ilium, unspecified fracture morphology, unspecified laterality, initial encounter [facility])  S32.309A       ED Discharge Orders     None       Discharge Instructions   None        Sloan Leiter, DO 07/19/21 1630

## 2021-07-20 DIAGNOSIS — J9611 Chronic respiratory failure with hypoxia: Secondary | ICD-10-CM | POA: Diagnosis not present

## 2021-07-20 DIAGNOSIS — I495 Sick sinus syndrome: Secondary | ICD-10-CM

## 2021-07-20 DIAGNOSIS — S32309S Unspecified fracture of unspecified ilium, sequela: Secondary | ICD-10-CM | POA: Diagnosis not present

## 2021-07-20 DIAGNOSIS — E876 Hypokalemia: Secondary | ICD-10-CM | POA: Diagnosis not present

## 2021-07-20 DIAGNOSIS — J8410 Pulmonary fibrosis, unspecified: Secondary | ICD-10-CM

## 2021-07-20 DIAGNOSIS — I10 Essential (primary) hypertension: Secondary | ICD-10-CM

## 2021-07-20 DIAGNOSIS — E119 Type 2 diabetes mellitus without complications: Secondary | ICD-10-CM

## 2021-07-20 DIAGNOSIS — E441 Mild protein-calorie malnutrition: Secondary | ICD-10-CM

## 2021-07-20 DIAGNOSIS — N1831 Chronic kidney disease, stage 3a: Secondary | ICD-10-CM

## 2021-07-20 DIAGNOSIS — D649 Anemia, unspecified: Secondary | ICD-10-CM

## 2021-07-20 DIAGNOSIS — S32392A Other fracture of left ilium, initial encounter for closed fracture: Secondary | ICD-10-CM | POA: Diagnosis not present

## 2021-07-20 LAB — HEMOGLOBIN A1C
Hgb A1c MFr Bld: 5.3 % (ref 4.8–5.6)
Mean Plasma Glucose: 105.41 mg/dL

## 2021-07-20 LAB — URINE CULTURE: Culture: NO GROWTH

## 2021-07-20 LAB — GLUCOSE, CAPILLARY
Glucose-Capillary: 90 mg/dL (ref 70–99)
Glucose-Capillary: 129 mg/dL — ABNORMAL HIGH (ref 70–99)

## 2021-07-20 MED ORDER — OXYCODONE HCL 5 MG PO TABS: 2.5000 mg | ORAL_TABLET | ORAL | 0 refills | Status: DC | PRN

## 2021-07-20 MED ORDER — POTASSIUM CHLORIDE CRYS ER 20 MEQ PO TBCR
40.0000 meq | EXTENDED_RELEASE_TABLET | Freq: Once | ORAL | Status: AC
  Administered 2021-07-20: 40 meq via ORAL
  Filled 2021-07-20: qty 2

## 2021-07-20 MED ORDER — ORAL CARE MOUTH RINSE
15.0000 mL | Freq: Two times a day (BID) | OROMUCOSAL | Status: DC
  Administered 2021-07-20: 15 mL via OROMUCOSAL

## 2021-07-20 NOTE — Care Management CC44 (Signed)
Condition Code 44 Documentation Completed  Patient Details  Name: SEVIN FARONE MRN: [id number] Date of Birth: 10-26-1927   Condition Code 44 given:  Yes Patient signature on Condition Code 44 notice:  Yes Documentation of 2 MD's agreement:  Yes Code 44 added to claim:  Yes    Daritza Brees, RN 07/20/2021, 11:01 AM

## 2021-07-20 NOTE — TOC Progression Note (Signed)
Transition of Care [facility]) - Progression Note    Patient Details  Name: Brittany Munoz MRN: [id number] Date of Birth: [birth year]  Transition of Care [facility]) CM/SW Contact  Geni Bers, RN Phone Number: 07/20/2021, 10:01 AM  Clinical Narrative:    Pt is from [facility]. Spoke with Reuel Boom, RN at [facility]. Pt  may return. Need Discharge Summary fax to [phone].         Expected Discharge Plan and Services                                                 Social Determinants of Health (SDOH) Interventions    Readmission Risk Interventions No flowsheet data found.

## 2021-07-20 NOTE — NC FL2 (Signed)
[place] MEDICAID FL2 LEVEL OF CARE SCREENING TOOL     IDENTIFICATION  Patient Name: KIHANNA KAMIYA Birthdate: [birth year] Sex: female Admission Date (Current Location): 07/19/2021  [facility] and [place] Number:  Producer, television/film/video and Address:  [facility],  [address] [place], [place] [address]      Provider Number: [id number]  Attending Physician Name and Address:  Joycelyn Das, MD  Relative Name and Phone Number:  Natarsha, [facility]) Other [phone]    Current Level of Care: Hospital Recommended Level of Care: Skilled Nursing Facility Prior Approval Number:    Date Approved/Denied:   PASRR Number: [id number]  Discharge Plan: SNF    Current Diagnoses: Patient Active Problem List   Diagnosis Date Noted   Closed fracture of ilium, unspecified fracture morphology, sequela 07/19/2021   Hypokalemia 07/19/2021   Mild protein malnutrition (HCC) 07/19/2021   Closed left hip fracture (HCC) 06/04/2021   Tachycardia    Chronic respiratory failure with hypoxia (HCC)    Stage 3a chronic kidney disease (HCC)    Type 2 diabetes mellitus without complication, without long-term current use of insulin (HCC)    Bilateral hip pain 05/14/2021   Hyperlipidemia, unspecified 12/24/2016   Osteoporosis 12/24/2016   Pulmonary fibrosis (HCC) 12/24/2016   Wrist fracture, left 12/24/2016   Tachy-brady syndrome (HCC) 11/18/2016   B12 deficiency 11/03/2016   Chronic constipation 08/29/2016   Dyspnea 03/14/2016   Abdominal pain, RLQ (right lower quadrant) 10/08/2015   Chronic hyperglycemia 10/08/2015   COPD, moderate (HCC) 01/10/2015   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) 01/10/2015   Normocytic anemia 10/18/2013   End stage COPD (HCC) 10/18/2013   Essential hypertension 10/18/2013   Osteoarthrosis involving lower leg 10/18/2013   Vitamin D deficiency, unspecified 10/18/2013    Orientation RESPIRATION BLADDER Height & Weight     Self,  Situation  O2 (2L O2  [place]) External catheter, Continent Weight: 74.8 kg Height:  5\' 8"  (172.7 cm)  BEHAVIORAL SYMPTOMS/MOOD NEUROLOGICAL BOWEL NUTRITION STATUS      Continent Diet (Carb Modified)  AMBULATORY STATUS COMMUNICATION OF NEEDS Skin   Extensive Assist Verbally Other (Comment)                       Personal Care Assistance Level of Assistance  Bathing, Feeding, Dressing Bathing Assistance: Limited assistance Feeding assistance: Independent Dressing Assistance: Limited assistance     Functional Limitations Info  Sight, Hearing, Speech Sight Info: Adequate Hearing Info: Adequate Speech Info: Adequate    SPECIAL CARE FACTORS FREQUENCY  PT (By licensed PT), OT (By licensed OT)     PT Frequency: Eval and Treat OT Frequency: Eval and Treat            Contractures Contractures Info: Not present    Additional Factors Info  Code Status, Allergies Code Status Info: FULL Allergies Info: Aspirin, Cefuroxime Axetil, Moxifloxacin Hcl In Nacl, Oxycodone, Sulfamethoxazole-trimethoprim, Tramadol           Current Medications (07/20/2021):  This is the current hospital active medication list Current Facility-Administered Medications  Medication Dose Route Frequency Provider Last Rate Last Admin   acetaminophen (TYLENOL) tablet 650 mg  650 mg Oral Q6H PRN 07/22/2021, MD       Or   acetaminophen (TYLENOL) suppository 650 mg  650 mg Rectal Q6H PRN Bobette Mo, MD       albuterol (PROVENTIL) (2.5 MG/3ML) 0.083% nebulizer solution 3 mL  3 mL Inhalation Q4H PRN  Reubin Milan, MD       amLODipine [facility]) tablet 5 mg  5 mg Oral Daily Reubin Milan, MD   5 mg at 07/19/21 2050   budesonide (PULMICORT) nebulizer solution 0.25 mg  0.25 mg Inhalation BID Reubin Milan, MD   0.25 mg at 07/20/21 0949   DULoxetine (CYMBALTA) DR capsule 60 mg  60 mg Oral Daily Reubin Milan, MD   60 mg at 07/19/21 2049   ferrous gluconate (FERGON) tablet 324  mg  324 mg Oral Glenna Durand, MD       guaiFENesin [facility]) 12 hr tablet 600 mg  600 mg Oral q12n4p Reubin Milan, MD       insulin aspart (novoLOG) injection 0-9 Units  0-9 Units Subcutaneous TID WC Reubin Milan, MD       LORazepam (ATIVAN) tablet 2 mg  2 mg Oral QHS Reubin Milan, MD   2 mg at 07/19/21 2300   MEDLINE mouth rinse  15 mL Mouth Rinse BID Reubin Milan, MD       metoprolol succinate (TOPROL-XL) 24 hr tablet 25 mg  25 mg Oral Daily Reubin Milan, MD   25 mg at 07/19/21 2050   montelukast (SINGULAIR) tablet 10 mg  10 mg Oral QHS Reubin Milan, MD   10 mg at 07/19/21 2300   ondansetron (ZOFRAN) tablet 4 mg  4 mg Oral Q6H PRN Reubin Milan, MD       Or   ondansetron [facility]) injection 4 mg  4 mg Intravenous Q6H PRN Reubin Milan, MD       oxyCODONE (Oxy IR/ROXICODONE) immediate release tablet 2.5 mg  2.5 mg Oral Q3H PRN Reubin Milan, MD   2.5 mg at 07/20/21 0106   pantoprazole (PROTONIX) EC tablet 40 mg  40 mg Oral Daily Reubin Milan, MD       torsemide [facility]) tablet 10 mg  10 mg Oral Daily Reubin Milan, MD       umeclidinium bromide (INCRUSE ELLIPTA) 62.5 MCG/ACT 1 puff  1 puff Inhalation Daily Reubin Milan, MD         Discharge Medications: Please see discharge summary for a list of discharge medications.  Relevant Imaging Results:  Relevant Lab Results:   Additional Information [id number]  Purcell Mouton, RN

## 2021-07-20 NOTE — Progress Notes (Signed)
Call placed to [facility] place [phone]. Report given to [employer]. Questions, concerns were denied at this time. No change from am assessment. Pain has been managed. Pt remains alert, oriented x4. Pt is immobile no change from admission baseline.

## 2021-07-20 NOTE — Progress Notes (Signed)
Patients granddaughter contacted per pt request with update of transfer.

## 2021-07-20 NOTE — Care Management Obs Status (Signed)
MEDICARE OBSERVATION STATUS NOTIFICATION   Patient Details  Name: ABY GESSEL MRN: [id number] Date of Birth: August 24, 1927   Medicare Observation Status Notification Given:  Yes    Geni Bers, RN 07/20/2021, 11:01 AM

## 2021-07-20 NOTE — Discharge Summary (Signed)
Physician Discharge Summary  Brittany Munoz DOB: 1928/05/25 DOA: 07/19/2021  PCP: Mick Sell, MD  Admit date: 07/19/2021 Discharge date: 07/20/2021  Admitted From: Skilled nursing facility  Discharge disposition: Skilled nursing facility  Recommendations for Outpatient Follow-Up:   Follow up with your primary care provider in one week.  Check CBC, BMP, magnesium in the next visit Follow-up with Dr. Joice Lofts orthopedics.  Clinic to follow-up for an x-ray. Weightbearing as tolerated.  Please consider analgesics 30 to 45 minutes prior to PT.  Discharge Diagnosis:   Principal Problem:   Closed fracture of ilium, unspecified fracture morphology, sequela Active Problems:   Normocytic anemia   Essential hypertension   Pulmonary fibrosis (HCC)   Tachy-brady syndrome (HCC)   Chronic respiratory failure with hypoxia (HCC)   Stage 3a chronic kidney disease (HCC)   Type 2 diabetes mellitus without complication, without long-term current use of insulin (HCC)   Hypokalemia   Mild protein malnutrition (HCC)   Discharge Condition: Improved.  Diet recommendation: Low sodium, heart healthy.  Carbohydrate-modified.    Wound care: None.  Code status: Full.   History of Present Illness:   Brittany Munoz is a 87 y.o. female with medical history significant of anxiety, asthma, nonspecified cardiomyopathy, cataracts, COPD, osteoarthritis, depression, type II DM, eczema, gout, hemorrhoids, hyperlipidemia, hypertension, nocturnal hypoxia, osteoporosis, pulmonary fibrosis, seasonal allergies, varicella-zoster presented to hospital after complains of left hip pain for 1 day.  Unsure of fall or trauma.  P in the ED vitals were stable.  Patient had mild hypokalemia which was replenished.  X-ray of the hip showed osteopenia.  CT scan of the abdomen pelvis showed new oblique fracture over the posterior left ilium.  Orthopedic was consulted who recommended conservative  treatment and outpatient follow-up   Hospital Course:   Following conditions were addressed during hospitalization as listed below,  Closed fracture of ilium, unspecified fracture morphology, sequela Patient will weight-bear as tolerated.  Conservative treatment at this time. Orthopedic surgery (Dr Joice Lofts) was contacted who recommended to follow-up x-ray in 1 week.  Dr. Binnie Rail office will arrange for follow-up     Normocytic anemia Hemoglobin of 10.5.  No evidence of bleeding.    Essential hypertension Continue amlodipine and metoprolol from skilled nursing facility.     Chronic respiratory failure with hypoxia secondary to pulmonary fibrosis.   Continue supplemental oxygen, Pulmicort and bronchodilators.    Tachy-brady syndrome  Continue Toprol-XL     Stage 3a chronic kidney disease  At baseline.  Continue to monitor as outpatient.  Creatinine prior to discharge was 8.7.     Type 2 diabetes mellitus without complication,   without long-term current use of insulin  Diabetic diet.     Hypokalemia Replenished.   Disposition.  At this time, patient is stable for disposition back to skilled nursing facility with outpatient orthopedic follow-up.  Spoke with the granddaughter at bedside.  Medical Consultants:   Verbal consult with orthopedics.  Procedures:    None Subjective:   Today, patient was seen and examined at bedside.  Complains of mild pain at the hip area.  Denies any increasing shortness of breath, nausea, vomiting, fever or chills  Discharge Exam:   Vitals:   07/20/21 0834 07/20/21 0950  BP: (!) 150/66   Pulse: 82   Resp: 20   Temp: 97.7 F (36.5 C)   SpO2: 100% 98%   Vitals:   07/19/21 2349 07/20/21 0428 07/20/21 0834 07/20/21 0950  BP: 129/72 140/71 (!) 150/66  Pulse: 99 79 82   Resp: 18 18 20    Temp: 98.1 F (36.7 C) (!) 97.5 F (36.4 C) 97.7 F (36.5 C)   TempSrc: Oral Axillary Oral   SpO2: 100% 100% 100% 98%  Weight:      Height:        General: Alert awake, not in obvious distress, on nasal cannula oxygen, frail elderly female HENT: pupils equally reacting to light,  No scleral pallor or icterus noted. Oral mucosa is moist.  Chest:  Diminished breath sounds bilaterally.  Crackles noted. CVS: S1 &S2 heard. No murmur.  Regular rate and rhythm. Abdomen: Soft, nontender, nondistended.  Bowel sounds are heard.  Left hip area tenderness on palpation Extremities: No cyanosis, clubbing or edema.  Peripheral pulses are palpable. Psych: Alert, awake and oriented, normal mood CNS:  No cranial nerve deficits.  Generalized weakness noted. Skin: Warm and dry.  No rashes noted.  The results of significant diagnostics from this hospitalization (including imaging, microbiology, ancillary and laboratory) are listed below for reference.     Diagnostic Studies:   CT ABDOMEN PELVIS WO CONTRAST  Result Date: 07/19/2021 CLINICAL DATA:  Flank pain, left hip pain. EXAM: CT ABDOMEN AND PELVIS WITHOUT CONTRAST TECHNIQUE: Multidetector CT imaging of the abdomen and pelvis was performed following the standard protocol without IV contrast. COMPARISON:  Pelvic CT no contrast 05/14/2021, CT abdomen and pelvis with contrast 09/09/2016. FINDINGS: Factors affecting image quality: Respiratory motion artifact in the patient's arms in the field limited assessment of abdominal structures; abundant spray artifact from a left hip arthroplasty. Lower chest: There is subpleural opacity with bronchiolectasis in the posterior basal right lower lobe consistent with fibrosis and/or chronic aspiration. There is asymmetric thickening of the right lower lobe second and third order bronchi. Scattered linear scar-like opacities. Mild cardiomegaly without pericardial effusion. Hepatobiliary: Not well seen due to breathing motion. No obvious liver mass. Gallbladder again noted absent without biliary dilatation. Pancreas: Not well evaluated without contrast. No obvious mass or  ductal dilatation. Spleen: No mass or splenomegaly is seen without contrast. Adrenals/Urinary Tract: There is no adrenal mass. Cortical scarring again noted superior pole right kidney. There are small renal cysts. There are no stones or hydronephrosis. Distal left ureter is not well seen due to artifact from a left hip arthroplasty newly installed since 05/14/2021 with operative date 06/04/2021. The lower half of the bladder is also obscured by the hip replacement. Stomach/Bowel: There is moderate circumferential gastric thickening. Probable diffuse gastritis. No small bowel obstruction or inflammation. An appendix is not seen in this patient. There is constipation ascending and transverse colon, left colonic diverticulosis without colitis or diverticulitis. Vascular/Lymphatic: Aortic atherosclerosis. No enlarged abdominal or pelvic lymph nodes. Reproductive: The uterus is absent.  No adnexal lesion is seen. Other: No free air, hemorrhage or fluid. Musculoskeletal: There is a minimally displaced oblique fracture of the posterior lower left ilium extending through the posterior roof of the left acetabulum. Hemarthrosis is likely present in the left hip joint space but is obscured by the left hip arthroplasty. There is stranding in the adjacent soft tissues but no space-occupying intramuscular hematoma. There are no further displaced fractures. There is osteopenia. Advanced degenerative arthrosis is again noted of the right hip. There is no lumbar spinal compression injury. Facet hypertrophy lower lumbar spine is seen most advanced at L4-5. IMPRESSION: 1. Not seen on plain films earlier today, and new compared to 05/14/2021, an oblique fracture extends through the posterior lower left ilium into the  posterior roof of the left acetabulum with slight displacement. There is no visible healing reaction at this time. There is stranding in the adjacent soft tissues but there is no pelvic wall hematoma. There is abundant  metal artifact from a left hip arthroplasty placed 06/04/2021. 2. No evidence of urinary stones or obstruction. 3. Probable diffuse gastritis. 4. Constipation with diverticulosis. 5. Asymmetric right lower lobe bronchial thickening with posterior basal opacity with bronchiolectasis consistent with localized fibrosis and possible chronic aspiration. 6. Aortic atherosclerosis and additional findings discussed above. Electronically Signed   By: Telford Nab M.D.   On: 07/19/2021 06:39   DG HIP UNILAT WITH PELVIS 2-3 VIEWS LEFT  Result Date: 07/19/2021 CLINICAL DATA:  Increased hip pain. EXAM: DG HIP (WITH OR WITHOUT PELVIS) 2-3V LEFT COMPARISON:  Postoperative films following left hip replacement 06/04/2021. FINDINGS: Left hip arthroplasty with cemented femoral component is again noted without evidence of loosening or dislocation, or periprosthetic fractures. The bones are diffusely demineralized. Advanced bone-on-bone right hip DJD is again shown with acetabular and femoral head osteophytes. There is no widening at the SI joints and pubic symphysis. There have been no significant interval changes. IMPRESSION: Osteopenia with left total joint arthroplasty without evidence of loosening, dislocation or periprosthetic fracture. Advanced right hip DJD. Electronically Signed   By: Telford Nab M.D.   On: 07/19/2021 04:25     Labs:   Basic Metabolic Panel: Recent Labs  Lab 07/19/21 0210  NA 139  K 3.1*  CL 100  CO2 28  GLUCOSE 91  BUN 14  CREATININE 0.71  CALCIUM 8.8*   GFR Estimated Creatinine Clearance: 46.2 mL/min (by C-G formula based on SCr of 0.71 mg/dL). Liver Function Tests: Recent Labs  Lab 07/19/21 0210  AST 14*  ALT 6  ALKPHOS 65  BILITOT 0.4  PROT 6.2*  ALBUMIN 3.2*   No results for input(s): LIPASE, AMYLASE in the last 168 hours. No results for input(s): AMMONIA in the last 168 hours. Coagulation profile No results for input(s): INR, PROTIME in the last 168  hours.  CBC: Recent Labs  Lab 07/19/21 0210  WBC 9.5  HGB 10.5*  HCT 35.3*  MCV 82.3  PLT 472*   Cardiac Enzymes: No results for input(s): CKTOTAL, CKMB, CKMBINDEX, TROPONINI in the last 168 hours. BNP: Invalid input(s): POCBNP CBG: Recent Labs  Lab 07/19/21 1239 07/19/21 1805 07/19/21 2117 07/20/21 0737  GLUCAP 87 87 120* 90   D-Dimer No results for input(s): DDIMER in the last 72 hours. Hgb A1c Recent Labs    07/20/21 0526  HGBA1C 5.3   Lipid Profile No results for input(s): CHOL, HDL, LDLCALC, TRIG, CHOLHDL, LDLDIRECT in the last 72 hours. Thyroid function studies No results for input(s): TSH, T4TOTAL, T3FREE, THYROIDAB in the last 72 hours.  Invalid input(s): FREET3 Anemia work up No results for input(s): VITAMINB12, FOLATE, FERRITIN, TIBC, IRON, RETICCTPCT in the last 72 hours. Microbiology Recent Results (from the past 240 hour(s))  Resp Panel by RT-PCR (Flu A&B, Covid)     Status: None   Collection Time: 07/19/21  8:14 AM   Specimen: Nasopharyngeal(NP) swabs in vial transport medium  Result Value Ref Range Status   SARS Coronavirus 2 by RT PCR NEGATIVE NEGATIVE Final    Comment: (NOTE) SARS-CoV-2 target nucleic acids are NOT DETECTED.  The SARS-CoV-2 RNA is generally detectable in upper respiratory specimens during the acute phase of infection. The lowest concentration of SARS-CoV-2 viral copies this assay can detect is 138 copies/mL. A negative  result does not preclude SARS-Cov-2 infection and should not be used as the sole basis for treatment or other patient management decisions. A negative result may occur with  improper specimen collection/handling, submission of specimen other than nasopharyngeal swab, presence of viral mutation(s) within the areas targeted by this assay, and inadequate number of viral copies(<138 copies/mL). A negative result must be combined with clinical observations, patient history, and epidemiological information. The  expected result is Negative.  Fact Sheet for Patients:  EntrepreneurPulse.com.au  Fact Sheet for Healthcare Providers:  IncredibleEmployment.be  This test is no t yet approved or cleared by the [place] FDA and  has been authorized for detection and/or diagnosis of SARS-CoV-2 by FDA under an Emergency Use Authorization (EUA). This EUA will remain  in effect (meaning this test can be used) for the duration of the COVID-19 declaration under Section 564(b)(1) of the Act, 21 U.S.C.section 360bbb-3(b)(1), unless the authorization is terminated  or revoked sooner.       Influenza A by PCR NEGATIVE NEGATIVE Final   Influenza B by PCR NEGATIVE NEGATIVE Final    Comment: (NOTE) The Xpert Xpress SARS-CoV-2/FLU/RSV plus assay is intended as an aid in the diagnosis of influenza from Nasopharyngeal swab specimens and should not be used as a sole basis for treatment. Nasal washings and aspirates are unacceptable for Xpert Xpress SARS-CoV-2/FLU/RSV testing.  Fact Sheet for Patients: EntrepreneurPulse.com.au  Fact Sheet for Healthcare Providers: IncredibleEmployment.be  This test is not yet approved or cleared by the [place] FDA and has been authorized for detection and/or diagnosis of SARS-CoV-2 by FDA under an Emergency Use Authorization (EUA). This EUA will remain in effect (meaning this test can be used) for the duration of the COVID-19 declaration under Section 564(b)(1) of the Act, 21 U.S.C. section 360bbb-3(b)(1), unless the authorization is terminated or revoked.  Performed at [facility], [place] [address] [place], [place] [address]   Urine Culture     Status: None   Collection Time: 07/19/21  9:28 AM   Specimen: Urine, Clean Catch  Result Value Ref Range Status   Specimen Description   Final    URINE, CLEAN CATCH Performed at [facility], [place] [address] [place], [place] [address]    Special Requests   Final    NONE Performed at [facility], [place] [address] [place], [place] [address]    Culture   Final    NO GROWTH Performed at [facility], [place] [address], [place], [place] [address]    Report Status 07/20/2021 FINAL  Final     Discharge Instructions:   Discharge Instructions     Diet - low sodium heart healthy   Complete by: As directed    Discharge instructions   Complete by: As directed    Continue PT at the SNF. Follow up with your primary care provider in 3-5 days.   Increase activity slowly   Complete by: As directed    No wound care   Complete by: As directed       Allergies as of 07/20/2021       Reactions   Aspirin Anaphylaxis   Cefuroxime Axetil Swelling   TOLERATED CEFAZOLIN 06/04/21   Moxifloxacin Hcl In Nacl    Other reaction(s): Unknown   Oxycodone Nausea Only   Sulfamethoxazole-trimethoprim    Other reaction(s): Unknown   Tramadol    Other reaction(s): Unknown        Medication List  STOP taking these medications    enoxaparin 30 MG/0.3ML injection Commonly known as: LOVENOX       TAKE these medications    acetaminophen 650 MG CR tablet Commonly known as: TYLENOL Take 1,300 mg by mouth 2 (two) times daily.   amLODipine 5 MG tablet Commonly known as: NORVASC Take 5 mg by mouth daily.   budesonide 0.25 MG/2ML nebulizer solution Commonly known as: PULMICORT Inhale 0.25 mLs into the lungs in the morning and at bedtime.   calcium carbonate 500 MG chewable tablet Commonly known as: TUMS - dosed in mg elemental calcium Chew 2 tablets by mouth daily.   cyanocobalamin 1000 MCG/ML injection Commonly known as: (VITAMIN B-12) Inject 1,000 mcg into the muscle every 30 (thirty) days.   DULoxetine 60 MG capsule Commonly known as: CYMBALTA Take 60 mg by mouth daily.   ferrous gluconate 240 (27 FE) MG tablet Commonly known as: FERGON Take 1 tablet (240 mg  total) by mouth 3 (three) times daily with meals. What changed: when to take this   guaiFENesin 600 MG 12 hr tablet Commonly known as: MUCINEX Take 600 mg by mouth 2 times daily at 12 noon and 4 pm.   LORazepam 1 MG tablet Commonly known as: ATIVAN Take 2 mg by mouth at bedtime.   LORazepam 2 MG tablet Commonly known as: ATIVAN Take 2 mg by mouth at bedtime.   metoprolol succinate 25 MG 24 hr tablet Commonly known as: TOPROL-XL Take 25 mg by mouth daily.   montelukast 10 MG tablet Commonly known as: SINGULAIR Take 10 mg by mouth at bedtime.   omeprazole 20 MG capsule Commonly known as: PRILOSEC Take 20 mg by mouth daily.   oxyCODONE 5 MG immediate release tablet Commonly known as: Oxy IR/ROXICODONE Take 0.5 tablets (2.5 mg total) by mouth every 4 (four) hours as needed for breakthrough pain or severe pain.   albuterol 108 (90 Base) MCG/ACT inhaler Commonly known as: VENTOLIN HFA Inhale 2 puffs into the lungs every 4 (four) hours as needed for wheezing or shortness of breath. What changed: Another medication with the same name was changed. Make sure you understand how and when to take each.   ProAir HFA 108 (90 Base) MCG/ACT inhaler Generic drug: albuterol USE 2 PUFFS EVERY 4 HOURS  AS NEEDED What changed: See the new instructions.   Spiriva Respimat 2.5 MCG/ACT Aers Generic drug: Tiotropium Bromide Monohydrate Inhale 2 puffs into the lungs daily.   torsemide 20 MG tablet Commonly known as: DEMADEX Take 10 mg by mouth daily.   Vitamin D (Ergocalciferol) 1.25 MG (50000 UNIT) Caps capsule Commonly known as: DRISDOL Take 1 capsule by mouth once a week.        Time coordinating discharge: 39 minutes  Signed:  Rasheem Figiel  [facility] 07/20/2021, 10:14 AM

## 2021-08-13 ENCOUNTER — Encounter
Admission: RE | Disposition: A | Discharge status: Skilled Nursing Facility | Payer: Self-pay | Source: Home / Self Care | Attending: Internal Medicine

## 2021-08-13 ENCOUNTER — Inpatient Hospital Stay
Admission: RE | Admit: 2021-08-13 | Discharge: 2021-08-19 | DRG: 463 | Disposition: A | Discharge status: Skilled Nursing Facility | Payer: Medicare Other | Attending: Internal Medicine | Admitting: Internal Medicine

## 2021-08-13 ENCOUNTER — Emergency Department (HOSPITAL_COMMUNITY): Payer: Medicare Other

## 2021-08-13 ENCOUNTER — Inpatient Hospital Stay: Payer: Medicare Other | Admitting: Anesthesiology

## 2021-08-13 ENCOUNTER — Emergency Department (HOSPITAL_COMMUNITY)
Admission: EM | Admit: 2021-08-13 | Discharge: 2021-08-13 | Payer: Medicare Other | Attending: Emergency Medicine | Admitting: Emergency Medicine

## 2021-08-13 ENCOUNTER — Inpatient Hospital Stay: Payer: Medicare Other

## 2021-08-13 ENCOUNTER — Encounter (HOSPITAL_COMMUNITY): Payer: Self-pay | Admitting: Emergency Medicine

## 2021-08-13 ENCOUNTER — Other Ambulatory Visit: Payer: Self-pay

## 2021-08-13 ENCOUNTER — Emergency Department
Admission: EM | Admit: 2021-08-13 | Discharge: 2021-08-13 | Disposition: A | Discharge status: Home / Self Care | Payer: Medicare Other | Source: Home / Self Care

## 2021-08-13 ENCOUNTER — Other Ambulatory Visit: Payer: Self-pay | Admitting: Surgery

## 2021-08-13 DIAGNOSIS — E876 Hypokalemia: Secondary | ICD-10-CM | POA: Diagnosis present

## 2021-08-13 DIAGNOSIS — Z20822 Contact with and (suspected) exposure to covid-19: Secondary | ICD-10-CM | POA: Insufficient documentation

## 2021-08-13 DIAGNOSIS — Z885 Allergy status to narcotic agent status: Secondary | ICD-10-CM

## 2021-08-13 DIAGNOSIS — I495 Sick sinus syndrome: Secondary | ICD-10-CM | POA: Diagnosis present

## 2021-08-13 DIAGNOSIS — E871 Hypo-osmolality and hyponatremia: Secondary | ICD-10-CM | POA: Diagnosis not present

## 2021-08-13 DIAGNOSIS — J8410 Pulmonary fibrosis, unspecified: Secondary | ICD-10-CM | POA: Diagnosis present

## 2021-08-13 DIAGNOSIS — L8990 Pressure ulcer of unspecified site, unspecified stage: Secondary | ICD-10-CM | POA: Insufficient documentation

## 2021-08-13 DIAGNOSIS — Z419 Encounter for procedure for purposes other than remedying health state, unspecified: Secondary | ICD-10-CM

## 2021-08-13 DIAGNOSIS — E1122 Type 2 diabetes mellitus with diabetic chronic kidney disease: Secondary | ICD-10-CM | POA: Diagnosis present

## 2021-08-13 DIAGNOSIS — M810 Age-related osteoporosis without current pathological fracture: Secondary | ICD-10-CM | POA: Diagnosis present

## 2021-08-13 DIAGNOSIS — Z886 Allergy status to analgesic agent status: Secondary | ICD-10-CM

## 2021-08-13 DIAGNOSIS — Z79899 Other long term (current) drug therapy: Secondary | ICD-10-CM

## 2021-08-13 DIAGNOSIS — D62 Acute posthemorrhagic anemia: Secondary | ICD-10-CM | POA: Diagnosis not present

## 2021-08-13 DIAGNOSIS — Y792 Prosthetic and other implants, materials and accessory orthopedic devices associated with adverse incidents: Secondary | ICD-10-CM | POA: Diagnosis present

## 2021-08-13 DIAGNOSIS — E785 Hyperlipidemia, unspecified: Secondary | ICD-10-CM | POA: Diagnosis present

## 2021-08-13 DIAGNOSIS — D649 Anemia, unspecified: Secondary | ICD-10-CM | POA: Diagnosis present

## 2021-08-13 DIAGNOSIS — T84021A Dislocation of internal left hip prosthesis, initial encounter: Principal | ICD-10-CM | POA: Diagnosis present

## 2021-08-13 DIAGNOSIS — I129 Hypertensive chronic kidney disease with stage 1 through stage 4 chronic kidney disease, or unspecified chronic kidney disease: Secondary | ICD-10-CM | POA: Diagnosis present

## 2021-08-13 DIAGNOSIS — E538 Deficiency of other specified B group vitamins: Secondary | ICD-10-CM | POA: Diagnosis present

## 2021-08-13 DIAGNOSIS — Z882 Allergy status to sulfonamides status: Secondary | ICD-10-CM

## 2021-08-13 DIAGNOSIS — S32402A Unspecified fracture of left acetabulum, initial encounter for closed fracture: Secondary | ICD-10-CM | POA: Diagnosis present

## 2021-08-13 DIAGNOSIS — Z8261 Family history of arthritis: Secondary | ICD-10-CM

## 2021-08-13 DIAGNOSIS — X58XXXA Exposure to other specified factors, initial encounter: Secondary | ICD-10-CM | POA: Insufficient documentation

## 2021-08-13 DIAGNOSIS — I10 Essential (primary) hypertension: Secondary | ICD-10-CM | POA: Diagnosis present

## 2021-08-13 DIAGNOSIS — Z7951 Long term (current) use of inhaled steroids: Secondary | ICD-10-CM

## 2021-08-13 DIAGNOSIS — E559 Vitamin D deficiency, unspecified: Secondary | ICD-10-CM | POA: Diagnosis present

## 2021-08-13 DIAGNOSIS — I429 Cardiomyopathy, unspecified: Secondary | ICD-10-CM | POA: Diagnosis present

## 2021-08-13 DIAGNOSIS — J9611 Chronic respiratory failure with hypoxia: Secondary | ICD-10-CM | POA: Diagnosis present

## 2021-08-13 DIAGNOSIS — J449 Chronic obstructive pulmonary disease, unspecified: Secondary | ICD-10-CM | POA: Diagnosis present

## 2021-08-13 DIAGNOSIS — S73005A Unspecified dislocation of left hip, initial encounter: Secondary | ICD-10-CM

## 2021-08-13 DIAGNOSIS — Z85828 Personal history of other malignant neoplasm of skin: Secondary | ICD-10-CM

## 2021-08-13 DIAGNOSIS — Z881 Allergy status to other antibiotic agents status: Secondary | ICD-10-CM

## 2021-08-13 DIAGNOSIS — N1831 Chronic kidney disease, stage 3a: Secondary | ICD-10-CM | POA: Diagnosis present

## 2021-08-13 DIAGNOSIS — F419 Anxiety disorder, unspecified: Secondary | ICD-10-CM | POA: Diagnosis present

## 2021-08-13 DIAGNOSIS — K5909 Other constipation: Secondary | ICD-10-CM | POA: Diagnosis present

## 2021-08-13 DIAGNOSIS — F32A Depression, unspecified: Secondary | ICD-10-CM | POA: Diagnosis present

## 2021-08-13 DIAGNOSIS — D75838 Other thrombocytosis: Secondary | ICD-10-CM | POA: Diagnosis present

## 2021-08-13 DIAGNOSIS — E119 Type 2 diabetes mellitus without complications: Secondary | ICD-10-CM

## 2021-08-13 DIAGNOSIS — L89152 Pressure ulcer of sacral region, stage 2: Secondary | ICD-10-CM | POA: Diagnosis present

## 2021-08-13 DIAGNOSIS — Z800 Family history of malignant neoplasm of digestive organs: Secondary | ICD-10-CM

## 2021-08-13 DIAGNOSIS — S79912A Unspecified injury of left hip, initial encounter: Secondary | ICD-10-CM | POA: Diagnosis present

## 2021-08-13 DIAGNOSIS — Z87891 Personal history of nicotine dependence: Secondary | ICD-10-CM

## 2021-08-13 DIAGNOSIS — F0390 Unspecified dementia without behavioral disturbance: Secondary | ICD-10-CM | POA: Diagnosis present

## 2021-08-13 DIAGNOSIS — Z96649 Presence of unspecified artificial hip joint: Secondary | ICD-10-CM

## 2021-08-13 DIAGNOSIS — Z825 Family history of asthma and other chronic lower respiratory diseases: Secondary | ICD-10-CM

## 2021-08-13 DIAGNOSIS — Z8249 Family history of ischemic heart disease and other diseases of the circulatory system: Secondary | ICD-10-CM

## 2021-08-13 HISTORY — PX: HIP CLOSED REDUCTION: SHX983

## 2021-08-13 LAB — CBG MONITORING, ED: Glucose-Capillary: 85 mg/dL (ref 70–99)

## 2021-08-13 LAB — GLUCOSE, CAPILLARY: Glucose-Capillary: 94 mg/dL (ref 70–99)

## 2021-08-13 LAB — RESP PANEL BY RT-PCR (FLU A&B, COVID) ARPGX2
Influenza A by PCR: NEGATIVE
Influenza B by PCR: NEGATIVE
SARS Coronavirus 2 by RT PCR: NEGATIVE

## 2021-08-13 LAB — SAMPLE TO BLOOD BANK

## 2021-08-13 SURGERY — CLOSED REDUCTION, HIP
Anesthesia: General | Site: Hip | Laterality: Left

## 2021-08-13 MED ORDER — CHLORHEXIDINE GLUCONATE 0.12 % MT SOLN
OROMUCOSAL | Status: AC
  Administered 2021-08-13: 15 mL via OROMUCOSAL
  Filled 2021-08-13: qty 15

## 2021-08-13 MED ORDER — LIDOCAINE HCL (CARDIAC) PF 100 MG/5ML IV SOSY
PREFILLED_SYRINGE | INTRAVENOUS | Status: DC | PRN
Start: 2021-08-13 — End: 2021-08-13
  Administered 2021-08-13: 50 mg via INTRATRACHEAL

## 2021-08-13 MED ORDER — FENTANYL CITRATE (PF) 100 MCG/2ML IJ SOLN
INTRAMUSCULAR | Status: AC
  Administered 2021-08-13: 25 ug via INTRAVENOUS
  Filled 2021-08-13: qty 2

## 2021-08-13 MED ORDER — CALCIUM CARBONATE ANTACID 500 MG PO CHEW
2.0000 | CHEWABLE_TABLET | Freq: Every day | ORAL | Status: DC
  Administered 2021-08-14 – 2021-08-19 (×5): 400 mg via ORAL
  Filled 2021-08-13 (×5): qty 2

## 2021-08-13 MED ORDER — CHLORHEXIDINE GLUCONATE 0.12 % MT SOLN: 15.0000 mL | Freq: Once | OROMUCOSAL | Status: AC

## 2021-08-13 MED ORDER — FENTANYL CITRATE (PF) 100 MCG/2ML IJ SOLN
25.0000 ug | INTRAMUSCULAR | Status: AC | PRN
  Administered 2021-08-13 (×4): 25 ug via INTRAVENOUS

## 2021-08-13 MED ORDER — ACETAMINOPHEN 325 MG PO TABS
650.0000 mg | ORAL_TABLET | Freq: Four times a day (QID) | ORAL | Status: DC | PRN
  Administered 2021-08-14: 650 mg via ORAL
  Filled 2021-08-13: qty 2

## 2021-08-13 MED ORDER — INSULIN ASPART 100 UNIT/ML IJ SOLN: 0.0000 [IU] | Freq: Three times a day (TID) | INTRAMUSCULAR | Status: DC

## 2021-08-13 MED ORDER — ONDANSETRON HCL 4 MG/2ML IJ SOLN: 4.0000 mg | Freq: Four times a day (QID) | INTRAMUSCULAR | Status: DC | PRN

## 2021-08-13 MED ORDER — ORAL CARE MOUTH RINSE: 15.0000 mL | Freq: Once | OROMUCOSAL | Status: AC

## 2021-08-13 MED ORDER — TORSEMIDE 20 MG PO TABS
10.0000 mg | ORAL_TABLET | Freq: Every day | ORAL | Status: DC
  Administered 2021-08-14 – 2021-08-19 (×6): 10 mg via ORAL
  Filled 2021-08-13 (×6): qty 1

## 2021-08-13 MED ORDER — CLINDAMYCIN PHOSPHATE 900 MG/50ML IV SOLN
INTRAVENOUS | Status: AC
  Filled 2021-08-13: qty 50

## 2021-08-13 MED ORDER — ENOXAPARIN SODIUM 40 MG/0.4ML IJ SOSY: 40.0000 mg | PREFILLED_SYRINGE | INTRAMUSCULAR | Status: DC

## 2021-08-13 MED ORDER — LORAZEPAM 2 MG PO TABS
2.0000 mg | ORAL_TABLET | Freq: Two times a day (BID) | ORAL | Status: DC | PRN
  Administered 2021-08-15 – 2021-08-19 (×3): 2 mg via ORAL
  Filled 2021-08-13 (×3): qty 1

## 2021-08-13 MED ORDER — ALBUTEROL SULFATE (2.5 MG/3ML) 0.083% IN NEBU
2.5000 mg | INHALATION_SOLUTION | RESPIRATORY_TRACT | Status: DC | PRN
  Filled 2021-08-13: qty 3

## 2021-08-13 MED ORDER — DULOXETINE HCL 20 MG PO CPEP
20.0000 mg | ORAL_CAPSULE | Freq: Two times a day (BID) | ORAL | Status: DC
  Administered 2021-08-14 – 2021-08-19 (×10): 20 mg via ORAL
  Filled 2021-08-13 (×12): qty 1

## 2021-08-13 MED ORDER — ONDANSETRON HCL 4 MG PO TABS: 4.0000 mg | ORAL_TABLET | Freq: Four times a day (QID) | ORAL | Status: DC | PRN

## 2021-08-13 MED ORDER — MONTELUKAST SODIUM 10 MG PO TABS
10.0000 mg | ORAL_TABLET | Freq: Every day | ORAL | Status: DC
  Administered 2021-08-13 – 2021-08-18 (×6): 10 mg via ORAL
  Filled 2021-08-13 (×6): qty 1

## 2021-08-13 MED ORDER — AMLODIPINE BESYLATE 5 MG PO TABS
5.0000 mg | ORAL_TABLET | Freq: Every day | ORAL | Status: DC
  Administered 2021-08-14 – 2021-08-19 (×5): 5 mg via ORAL
  Filled 2021-08-13 (×6): qty 1

## 2021-08-13 MED ORDER — TRAMADOL HCL 50 MG PO TABS
25.0000 mg | ORAL_TABLET | Freq: Two times a day (BID) | ORAL | Status: DC
  Administered 2021-08-13 – 2021-08-19 (×10): 25 mg via ORAL
  Filled 2021-08-13 (×11): qty 1

## 2021-08-13 MED ORDER — ACETAMINOPHEN 650 MG RE SUPP
650.0000 mg | Freq: Four times a day (QID) | RECTAL | Status: DC | PRN
  Filled 2021-08-13: qty 1

## 2021-08-13 MED ORDER — BUDESONIDE 0.25 MG/2ML IN SUSP
0.2500 mL | Freq: Two times a day (BID) | RESPIRATORY_TRACT | Status: DC
  Filled 2021-08-13: qty 0.25

## 2021-08-13 MED ORDER — FERROUS GLUCONATE 324 (38 FE) MG PO TABS
324.0000 mg | ORAL_TABLET | ORAL | Status: DC
  Administered 2021-08-14 – 2021-08-18 (×3): 324 mg via ORAL
  Filled 2021-08-13 (×3): qty 1

## 2021-08-13 MED ORDER — SODIUM CHLORIDE 0.9 % IV SOLN: INTRAVENOUS | Status: DC

## 2021-08-13 MED ORDER — BUDESONIDE 0.25 MG/2ML IN SUSP
0.2500 mg | Freq: Two times a day (BID) | RESPIRATORY_TRACT | Status: DC
  Administered 2021-08-13 – 2021-08-19 (×11): 0.25 mg via RESPIRATORY_TRACT
  Filled 2021-08-13 (×11): qty 2

## 2021-08-13 MED ORDER — PANTOPRAZOLE SODIUM 40 MG PO TBEC
40.0000 mg | DELAYED_RELEASE_TABLET | Freq: Every day | ORAL | Status: DC
  Administered 2021-08-14 – 2021-08-19 (×5): 40 mg via ORAL
  Filled 2021-08-13 (×5): qty 1

## 2021-08-13 MED ORDER — METOPROLOL SUCCINATE ER 25 MG PO TB24
25.0000 mg | ORAL_TABLET | Freq: Every day | ORAL | Status: DC
  Administered 2021-08-14 – 2021-08-19 (×5): 25 mg via ORAL
  Filled 2021-08-13 (×6): qty 1

## 2021-08-13 MED ORDER — TIOTROPIUM BROMIDE MONOHYDRATE 18 MCG IN CAPS
18.0000 ug | ORAL_CAPSULE | Freq: Every day | RESPIRATORY_TRACT | Status: DC
  Administered 2021-08-16 – 2021-08-19 (×4): 18 ug via RESPIRATORY_TRACT
  Filled 2021-08-13 (×2): qty 5

## 2021-08-13 MED ORDER — CLINDAMYCIN PHOSPHATE 900 MG/50ML IV SOLN
900.0000 mg | INTRAVENOUS | Status: AC
  Administered 2021-08-13: 900 mg via INTRAVENOUS

## 2021-08-13 MED ORDER — PROPOFOL 10 MG/ML IV BOLUS
INTRAVENOUS | Status: DC | PRN
Start: 2021-08-13 — End: 2021-08-13
  Administered 2021-08-13 (×2): 30 mg via INTRAVENOUS

## 2021-08-13 MED ORDER — ALBUTEROL SULFATE HFA 108 (90 BASE) MCG/ACT IN AERS: 2.0000 | INHALATION_SPRAY | RESPIRATORY_TRACT | Status: DC | PRN

## 2021-08-13 MED ORDER — OXYCODONE HCL 5 MG PO TABS
2.5000 mg | ORAL_TABLET | ORAL | Status: DC | PRN
Start: 2021-08-13 — End: 2021-08-16
  Administered 2021-08-13 – 2021-08-16 (×3): 2.5 mg via ORAL
  Filled 2021-08-13 (×4): qty 1

## 2021-08-13 SURGICAL SUPPLY — 12 items
GAUZE 4X4 16PLY ~~LOC~~+RFID DBL (SPONGE) ×1 IMPLANT
GLOVE SURG ENC MOIS LTX SZ8 (GLOVE) ×2 IMPLANT
GOWN STRL REUS W/ TWL LRG LVL3 (GOWN DISPOSABLE) ×1 IMPLANT
GOWN STRL REUS W/ TWL XL LVL3 (GOWN DISPOSABLE) ×1 IMPLANT
GOWN STRL REUS W/TWL LRG LVL3 (GOWN DISPOSABLE)
GOWN STRL REUS W/TWL XL LVL3 (GOWN DISPOSABLE)
HOLSTER ELECTROSUGICAL PENCIL (MISCELLANEOUS) ×1 IMPLANT
KIT TURNOVER KIT A (KITS) ×1 IMPLANT
MANIFOLD NEPTUNE II (INSTRUMENTS) ×1 IMPLANT
PACK HIP PROSTHESIS (MISCELLANEOUS) ×1 IMPLANT
SPONGE T-LAP 18X18 ~~LOC~~+RFID (SPONGE) ×4 IMPLANT
WATER STERILE IRR 500ML POUR (IV SOLUTION) ×1 IMPLANT

## 2021-08-13 NOTE — Transfer of Care (Signed)
Immediate Anesthesia Transfer of Care Note  Patient: Brittany Munoz  Procedure(s) Performed: CLOSED REDUCTION HIP (Left: Hip)  Patient Location: PACU  Anesthesia Type:MAC  Level of Consciousness: awake, drowsy and patient cooperative  Airway & Oxygen Therapy: Patient Spontanous Breathing  Post-op Assessment: Report given to RN and Post -op Vital signs reviewed and stable  Post vital signs: Reviewed and stable  Last Vitals:  Vitals Value Taken Time  BP 120/54 08/13/21 1652  Temp 36.6 C 08/13/21 1652  Pulse 74 08/13/21 1656  Resp 15 08/13/21 1656  SpO2 100 % 08/13/21 1656  Vitals shown include unvalidated device data.  Last Pain:  Vitals:   08/13/21 1607  TempSrc: Temporal  PainSc: 7          Complications: No notable events documented.

## 2021-08-13 NOTE — ED Notes (Signed)
IV placed, type and screen and full rainbow collected and sent to lab. Pre op called [facility] given report. Per Pre Op they will send down RN to get pt.

## 2021-08-13 NOTE — H&P (Signed)
Subjective:  Chief complaint: Left hip pain.  The patient is a 88 y.o. female who is now 10 weeks status post a cemented left bipolar hemiarthroplasty for a displaced left femoral neck fracture.  The patient's postoperative course was notable for a nondisplaced left acetabular fracture which was diagnosed approximately 4 weeks ago and was being managed nonsurgically at a skilled nursing facility.  Apparently, the patient began complaining of increased left hip pain and the nursing staff noted that there was some deformity of her left hip and lower extremity.  An x-ray of the left hip was obtained which apparently demonstrated a dislocation of the the left hip hemiarthroplasty.  Therefore, the patient was brought to the emergency room and presents at this time for an attempted closed reduction, possible open reduction of the dislocated left hip prosthesis.  The patient denies any associated injury or fall which may have contributed to the dislocation event.   Patient Active Problem List   Diagnosis Date Noted   Closed fracture of ilium, unspecified fracture morphology, sequela 07/19/2021   Hypokalemia 07/19/2021   Mild protein malnutrition ([place]) 07/19/2021   Closed left hip fracture ([place]) 06/04/2021   Tachycardia    Chronic respiratory failure with hypoxia (HCC)    Stage 3a chronic kidney disease ([place])    Type 2 diabetes mellitus without complication, without long-term current use of insulin ([place])    Bilateral hip pain 05/14/2021   Hyperlipidemia, unspecified 12/24/2016   Osteoporosis 12/24/2016   Pulmonary fibrosis ([place]) 12/24/2016   Wrist fracture, left 12/24/2016   Tachy-brady syndrome ([place]) 11/18/2016   B12 deficiency 11/03/2016   Chronic constipation 08/29/2016   Dyspnea 03/14/2016   Abdominal pain, RLQ (right lower quadrant) 10/08/2015   Chronic hyperglycemia 10/08/2015   COPD, moderate ([place]) 01/10/2015   Nocturnal hypoxemia due to obstructive chronic bronchitis ([place]) 01/10/2015    Normocytic anemia 10/18/2013   End stage COPD ([place]) 10/18/2013   Essential hypertension 10/18/2013   Osteoarthrosis involving lower leg 10/18/2013   Vitamin D deficiency, unspecified 10/18/2013   Past Medical History:  Diagnosis Date   Anxiety    Asthma    Cardiomyopathy ([place])    Cataract    COPD (chronic obstructive pulmonary disease) ([place])    Degenerative arthritis    Depression    Diabetes ([place])    Eczema    Gout    Hemorrhoids    Hyperlipidemia    Hypertension    Nocturnal hypoxia    Osteoporosis    Pulmonary fibrosis ([place])    Seasonal allergies    Shingles     Past Surgical History:  Procedure Laterality Date   BLADDER REPAIR     BREAST CYST REMOVAL     BROKEN ANKLE     CARDIAC CATHETERIZATION     CHOLECYSTECTOMY     HIP ARTHROPLASTY Left 06/04/2021   Procedure: ARTHROPLASTY BIPOLAR HIP (HEMIARTHROPLASTY);  Surgeon: Corky Mull, MD;  Location: [facility] ORS;  Service: Orthopedics;  Laterality: Left;   JOINT REPLACEMENT     KNEE ARTHROSCOPY     MOUTH SURGERY     OPEN REDUCTION INTERNAL FIXATION (ORIF) DISTAL RADIAL FRACTURE     REPAIR ROTATOR CUFF TEAR     SKIN CANCER REMOVED     RT KNEE AND FOREHEAD   TONSILLECTOMY AND ADENOIDECTOMY     WISDOM TOOTH REMOVAL      Medications Prior to Admission  Medication Sig Dispense Refill Last Dose   acetaminophen (TYLENOL) 650 MG CR tablet Take 1,300 mg by mouth  2 (two) times daily.      albuterol (VENTOLIN HFA) 108 (90 Base) MCG/ACT inhaler Inhale 2 puffs into the lungs every 4 (four) hours as needed for wheezing or shortness of breath.      amLODipine (NORVASC) 5 MG tablet Take 5 mg by mouth daily.      budesonide (PULMICORT) 0.25 MG/2ML nebulizer solution Inhale 0.25 mLs into the lungs in the morning and at bedtime.      calcium carbonate (TUMS - DOSED IN MG ELEMENTAL CALCIUM) 500 MG chewable tablet Chew 2 tablets by mouth daily.      cyanocobalamin (,VITAMIN B-12,) 1000 MCG/ML injection Inject 1,000 mcg into the muscle  every 30 (thirty) days.      DULoxetine (CYMBALTA) 60 MG capsule Take 60 mg by mouth daily.      ferrous gluconate (FERGON) 240 (27 FE) MG tablet Take 1 tablet (240 mg total) by mouth 3 (three) times daily with meals. (Patient taking differently: Take 240 mg by mouth every other day.)      guaiFENesin (MUCINEX) 600 MG 12 hr tablet Take 600 mg by mouth 2 times daily at 12 noon and 4 pm.      LORazepam (ATIVAN) 1 MG tablet Take 2 mg by mouth at bedtime.      LORazepam (ATIVAN) 2 MG tablet Take 2 mg by mouth at bedtime.      metoprolol succinate (TOPROL-XL) 25 MG 24 hr tablet Take 25 mg by mouth daily.      montelukast (SINGULAIR) 10 MG tablet Take 10 mg by mouth at bedtime.      omeprazole (PRILOSEC) 20 MG capsule Take 20 mg by mouth daily.      oxyCODONE (OXY IR/ROXICODONE) 5 MG immediate release tablet Take 0.5 tablets (2.5 mg total) by mouth every 4 (four) hours as needed for breakthrough pain or severe pain. 10 tablet 0    PROAIR HFA 108 (90 Base) MCG/ACT inhaler USE 2 PUFFS EVERY 4 HOURS  AS NEEDED (Patient taking differently: 2 puffs every 4 (four) hours as needed for wheezing or shortness of breath.) 51 g 0    SPIRIVA RESPIMAT 2.5 MCG/ACT AERS Inhale 2 puffs into the lungs daily.      torsemide (DEMADEX) 20 MG tablet Take 10 mg by mouth daily.      Vitamin D, Ergocalciferol, (DRISDOL) 50000 UNITS CAPS capsule Take 1 capsule by mouth once a week.      Allergies  Allergen Reactions   Aspirin Anaphylaxis   Cefuroxime Axetil Swelling    TOLERATED CEFAZOLIN 06/04/21   Moxifloxacin Hcl In Nacl     Other reaction(s): Unknown   Oxycodone Nausea Only   Sulfamethoxazole-Trimethoprim     Other reaction(s): Unknown   Tramadol     Other reaction(s): Unknown    Social History   Tobacco Use   Smoking status: Former    Packs/day: 2.00    Years: 25.00    Pack years: 50.00    Types: Cigarettes    Quit date: 12/16/1963    Years since quitting: 57.6   Smokeless tobacco: Never  Substance Use  Topics   Alcohol use: No    Alcohol/week: 0.0 standard drinks    Family History  Problem Relation Age of Onset   Hypertension Mother    Asthma Mother    COPD Mother    Arthritis Mother    Colon cancer Father    Arthritis Sister    Prostate cancer Neg Hx    Bladder Cancer Neg Hx  Kidney cancer Neg Hx      Review of Systems: As noted above. The patient denies any chest pain, shortness of breath, nausea, vomiting, diarrhea, constipation, belly pain, blood in his/her stool, or burning with urination.  Objective: Temp:  [98.7 F (37.1 C)] 98.7 F (37.1 C) (01/10 1354) Pulse Rate:  [70] 70 (01/10 1411) Resp:  [16] 16 (01/10 1411) BP: (130)/(60) 130/60 (01/10 1411) SpO2:  [96 %-98 %] 96 % (01/10 1411)  Physical Exam: General:  Alert, no acute distress Psychiatric:  Patient is questionably competent for consent, but exhibits normal mood and affect  Cardiovascular:  RRR  Respiratory:  Clear to auscultation. No wheezing. Non-labored breathing GI:  Abdomen is soft and non-tender Skin:  No lesions in the area of chief complaint Neurologic:  Sensation intact distally Lymphatic:  No axillary or cervical lymphadenopathy  Orthopedic Exam:  Orthopedic examination is limited to the left hip and lower extremity.  The left lower extremity is held in a flexed, shortened, and internally rotated position.  Her surgical incision is well-healed and without evidence for infection.  No swelling, erythema, ecchymosis, abrasions, or other skin abnormalities are identified.  There is some prominence of the posterior portion of the hip, consistent with her posterior hip dislocation.  She is grossly neurovascularly intact to the left lower extremity and foot.  Imaging Review: Recent x-rays of the pelvis and left hip are not available for review.  However, by report, these films demonstrate a posterior dislocation of the left hip prosthesis.  Assessment: Prosthetic dislocation, left hip  Plan: The  treatment options, including both surgical and nonsurgical choices, have been discussed in detail with the patient and her daughter-in-law, who is her power of attorney and who was contacted by phone.  The patient and her daughter-in-law have agreed to proceed with surgical intervention to include a closed, possible open, reduction of the prosthetic hip dislocation.  The risks (including bleeding, infection, nerve and/or blood vessel injury, persistent or recurrent pain, loosening or failure of the components, leg length inequality, dislocation, need for further surgery, blood clots, strokes, heart attacks or arrhythmias, pneumonia, etc.) and benefits of the surgical procedure were discussed as well.  The patient and her daughter-in-law states their understanding and agree to proceed.  A formal written consent will be obtained by the nursing staff.

## 2021-08-13 NOTE — ED Provider Notes (Signed)
° °  [facility] Provider Note    None    (approximate)   History     HPI  Brittany Munoz is a 88 y.o. female with history of hip fracture requiring arthroplasty by Dr. Reginia Naas in November who comes in from a transfer from [facility] for surgery with Dr. Joice Lofts.  Patient was scheduled for OR close reduction and came to the ER prior to being sent upstairs to surgery.   Physical Exam   Triage Vital Signs: ED Triage Vitals  Enc Vitals Group     BP      Pulse      Resp      Temp      Temp src      SpO2      Weight      Height      Head Circumference      Peak Flow      Pain Score      Pain Loc      Pain Edu?      Excl. in GC?     Most recent vital signs: There were no vitals filed for this visit.   General: Awake, no distress.  CV:  Good peripheral perfusion.  Resp:  Normal effort.  Abd:  No distention.  Other:  Patient's left hip is held in flexion and internal rotation but has 2+ distal pulse.   ED Results / Procedures / Treatments   IMPRESSION / MDM / ASSESSMENT AND PLAN / ED COURSE   Discussed the case with Dr. Joice Lofts who is planning on doing surgery and is expected to the OR.  FINAL CLINICAL IMPRESSION(S) / ED DIAGNOSES   Final diagnoses:  Dislocation of left hip, initial encounter [facility])     Rx / DC Orders   ED Discharge Orders     None        Note:  This document was prepared using Dragon voice recognition software and may include unintentional dictation errors.   Concha Se, MD 08/13/21 [phone]

## 2021-08-13 NOTE — Anesthesia Postprocedure Evaluation (Signed)
Anesthesia Post Note  Patient: Brittany Munoz  Procedure(s) Performed: CLOSED REDUCTION HIP (Left: Hip)  Patient location during evaluation: PACU Anesthesia Type: General Level of consciousness: awake and alert Pain management: pain level controlled Vital Signs Assessment: post-procedure vital signs reviewed and stable Respiratory status: spontaneous breathing, nonlabored ventilation, respiratory function stable and patient connected to nasal cannula oxygen Cardiovascular status: blood pressure returned to baseline and stable Postop Assessment: no apparent nausea or vomiting Anesthetic complications: no   No notable events documented.   Last Vitals:  Vitals:   08/13/21 1815 08/13/21 1829  BP: (!) 146/67 (!) 133/58  Pulse:  75  Resp:  17  Temp:  36.7 C  SpO2:  98%    Last Pain:  Vitals:   08/13/21 1815  TempSrc:   PainSc: 0-No pain                 Cleda Mccreedy Kerman Pfost

## 2021-08-13 NOTE — ED Notes (Signed)
Pt taken to preop at this time

## 2021-08-13 NOTE — ED Triage Notes (Signed)
Per [facility] pt coming from [facility] for hip dislocation. Staff states xray done yesterday confirming. Patient had been complaining of left hip pain and denies any injury. Patient actually has surgery scheduled for today at [facility].

## 2021-08-13 NOTE — H&P (Addendum)
History and Physical    Brittany Munoz [id number] DOB: 1928/03/23 DOA: 08/13/2021  PCP: Leonel Ramsay, MD  Patient coming from: [facility].  I have personally briefly reviewed patient's old medical records in [place]  Chief Complaint: Acetabular fracture and dislocation.  HPI: Brittany Munoz is a 88 y.o. female with medical history significant of anxiety, asthma, nonspecified cardiomyopathy, cataracts, COPD, osteoarthritis, depression, type II DM, eczema, gout, hemorrhoids, hyperlipidemia, hypertension, nocturnal hypoxia, osteoporosis, pulmonary fibrosis, seasonal allergies, varicella-zoster who I am familiar with after she went to the emergency department [facility] due to left hip pain.  The patient at that time was observed for pain control.  She was a scheduled to be seen by her orthopedic surgery Dr. Roland Rack today for the dislocation reduction but unfortunately he was noticed now that she has an acetabular fracture as well.  The patient is currently in PACU, will remain in the hospital for further evaluation, work-up and possible surgical intervention tomorrow or Thursday.  When seen, the patient denied headache, dyspnea, back, chest or abdominal pain, nausea, emesis, diarrhea, but she occasionally gets constipated.  No flank pain, dysuria, frequency or hematuria.  Review of Systems: As per HPI otherwise all other systems reviewed and are negative.  Past Medical History:  Diagnosis Date   Anxiety    Asthma    Cardiomyopathy ([place])    Cataract    COPD (chronic obstructive pulmonary disease) (HCC)    Degenerative arthritis    Depression    Diabetes ([place])    Eczema    Gout    Hemorrhoids    Hyperlipidemia    Hypertension    Nocturnal hypoxia    Osteoporosis    Pulmonary fibrosis ([place])    Seasonal allergies    Shingles    Past Surgical History:  Procedure Laterality Date   BLADDER REPAIR     BREAST CYST REMOVAL     BROKEN ANKLE     CARDIAC  CATHETERIZATION     CHOLECYSTECTOMY     HIP ARTHROPLASTY Left 06/04/2021   Procedure: ARTHROPLASTY BIPOLAR HIP (HEMIARTHROPLASTY);  Surgeon: Corky Mull, MD;  Location: [facility] ORS;  Service: Orthopedics;  Laterality: Left;   JOINT REPLACEMENT     KNEE ARTHROSCOPY     MOUTH SURGERY     OPEN REDUCTION INTERNAL FIXATION (ORIF) DISTAL RADIAL FRACTURE     REPAIR ROTATOR CUFF TEAR     SKIN CANCER REMOVED     RT KNEE AND FOREHEAD   TONSILLECTOMY AND ADENOIDECTOMY     WISDOM TOOTH REMOVAL     Social History  reports that she quit smoking about 57 years ago. Her smoking use included cigarettes. She has a 50.00 pack-year smoking history. She has never used smokeless tobacco. She reports that she does not drink alcohol and does not use drugs.  Allergies  Allergen Reactions   Aspirin Anaphylaxis   Cefuroxime Axetil Swelling    TOLERATED CEFAZOLIN 06/04/21   Moxifloxacin Hcl In Nacl     Other reaction(s): Unknown   Oxycodone Nausea Only   Sulfamethoxazole-Trimethoprim     Other reaction(s): Unknown   Tramadol     Other reaction(s): Unknown   Family History  Problem Relation Age of Onset   Hypertension Mother    Asthma Mother    COPD Mother    Arthritis Mother    Colon cancer Father    Arthritis Sister    Prostate cancer Neg Hx    Bladder Cancer Neg Hx  Kidney cancer Neg Hx    Prior to Admission medications   Medication Sig Start Date End Date Taking? Authorizing Provider  acetaminophen (TYLENOL) 650 MG CR tablet Take 1,300 mg by mouth 2 (two) times daily.   Yes [provider]  amLODipine (NORVASC) 5 MG tablet Take 5 mg by mouth daily. 05/01/21  Yes [provider]  budesonide (PULMICORT) 0.25 MG/2ML nebulizer solution Inhale 0.25 mLs into the lungs in the morning and at bedtime. 09/17/20  Yes [provider]  calcium carbonate (TUMS - DOSED IN MG ELEMENTAL CALCIUM) 500 MG chewable tablet Chew 2 tablets by mouth daily.   Yes [provider]   DULoxetine (CYMBALTA) 60 MG capsule Take 60 mg by mouth daily. 10/18/14  Yes [provider]  ferrous gluconate (FERGON) 240 (27 FE) MG tablet Take 1 tablet (240 mg total) by mouth 3 (three) times daily with meals. Patient taking differently: Take 240 mg by mouth every other day. 06/07/21  Yes Wyvonnia Dusky, MD  guaiFENesin (MUCINEX) 600 MG 12 hr tablet Take 600 mg by mouth 2 times daily at 12 noon and 4 pm.   Yes [provider]  LORazepam (ATIVAN) 1 MG tablet Take 2 mg by mouth at bedtime.   Yes [provider]  LORazepam (ATIVAN) 2 MG tablet Take 2 mg by mouth at bedtime. 07/05/21  Yes [provider]  metoprolol succinate (TOPROL-XL) 25 MG 24 hr tablet Take 25 mg by mouth daily.   Yes [provider]  montelukast (SINGULAIR) 10 MG tablet Take 10 mg by mouth at bedtime. 10/18/14  Yes [provider]  omeprazole (PRILOSEC) 20 MG capsule Take 20 mg by mouth daily. 06/12/21  Yes [provider]  SPIRIVA RESPIMAT 2.5 MCG/ACT AERS Inhale 2 puffs into the lungs daily. 07/09/21  Yes [provider]  torsemide (DEMADEX) 20 MG tablet Take 10 mg by mouth daily. 07/06/14  Yes [provider]  traMADol (ULTRAM) 50 MG tablet Take 25 mg by mouth 2 (two) times daily.   Yes [provider]  Vitamin D, Ergocalciferol, (DRISDOL) 50000 UNITS CAPS capsule Take 1 capsule by mouth once a week. 12/20/14  Yes [provider]  albuterol (VENTOLIN HFA) 108 (90 Base) MCG/ACT inhaler Inhale 2 puffs into the lungs every 4 (four) hours as needed for wheezing or shortness of breath.    [provider]  cyanocobalamin (,VITAMIN B-12,) 1000 MCG/ML injection Inject 1,000 mcg into the muscle every 30 (thirty) days. 05/19/16   [provider]  oxyCODONE (OXY IR/ROXICODONE) 5 MG immediate release tablet Take 0.5 tablets (2.5 mg total) by mouth every 4 (four) hours as needed for breakthrough pain or severe pain. 07/20/21    Pokhrel, Corrie Mckusick, MD  PROAIR HFA 108 (90 Base) MCG/ACT inhaler USE 2 PUFFS EVERY 4 HOURS  AS NEEDED Patient taking differently: 2 puffs every 4 (four) hours as needed for wheezing or shortness of breath. 11/03/18   Flora Lipps, MD    Physical Exam: Vitals:   08/13/21 1730 08/13/21 1735 08/13/21 1740 08/13/21 1745  BP: 132/65  (!) 147/64   Pulse: 72 76 79 77  Resp: 12 17 11 13   Temp:   (!) 97.2 F (36.2 C)   TempSrc:      SpO2: 97% 95% 94% 95%    Constitutional: Frail, elderly female.  NAD, calm, comfortable Eyes: PERRL, lids and conjunctivae normal ENMT: Mucous membranes are moist. Posterior pharynx clear of any exudate or lesions. Neck: normal, supple,  no masses, no thyromegaly Respiratory: Clear to auscultation bilaterally, no wheezing, no crackles. Normal respiratory effort. No accessory muscle use.  Cardiovascular: Regular rate and rhythm, no murmurs / rubs / gallops. No extremity edema. 2+ pedal pulses. No carotid bruits.  Abdomen: No distention.  Soft, no tenderness, no masses palpated. No hepatosplenomegaly. Bowel sounds positive.  Musculoskeletal: no clubbing / cyanosis.  Moderate generalized weakness.  Left hip tenderness and decreased ROM.  No contractures. Normal muscle tone.  Skin: no rashes, lesions, ulcers. No induration Neurologic: CN 2-12 grossly intact. Sensation intact, DTR normal. Strength 5/5 in all 4.  Psychiatric: Normal judgment and insight. Alert and oriented x 3. Normal mood.   Labs on Admission: I have personally reviewed following labs and imaging studies  CBC: No results for input(s): WBC, NEUTROABS, HGB, HCT, MCV, PLT in the last 168 hours.  Basic Metabolic Panel: No results for input(s): NA, K, CL, CO2, GLUCOSE, BUN, CREATININE, CALCIUM, MG, PHOS in the last 168 hours.  GFR: CrCl cannot be calculated (Patient's most recent lab result is older than the maximum 21 days allowed.).  Liver Function Tests: No results for input(s): AST, ALT, ALKPHOS,  BILITOT, PROT, ALBUMIN in the last 168 hours.  Radiological Exams on Admission: DG C-Arm 1-60 Min-No Report  Result Date: 08/13/2021 Fluoroscopy was utilized by the requesting physician.  No radiographic interpretation.   DG HIP PORT UNILAT WITH PELVIS 1V LEFT  Result Date: 08/13/2021 CLINICAL DATA:  Hip reduction EXAM: DG HIP (WITH OR WITHOUT PELVIS) 1V PORT LEFT COMPARISON:  08/13/2021, 06/04/2021 FINDINGS: Two low resolution intraoperative spot views of the left hip. Total fluoroscopy time was 5.2 seconds, fluoroscopy dose 1.26 mGy. The images demonstrate a left hip replacement with complete dislocation of the acetabular components in a superior direction, the acetabular component articulates with the femoral component. There is a displaced left acetabular fracture IMPRESSION: Intraoperative fluoroscopic assistance provided during planned hip reduction which was not performed. There is a dislocated left hip replacement that involves both the acetabular cup and the femoral prosthesis. There is a fracture involving the acetabulum Electronically Signed   By: Jasmine PangKim  Fujinaga M.D.   On: 08/13/2021 17:26    EKG: Independently reviewed.   Assessment/Plan Principal Problem:   Acetabulum fracture, left (HCC) Continue admit to telemetry/inpatient. Continue analgesics as needed. Surgery most likely on Thursday. However, will keep n.p.o. after midnight tonight.  Active Problems: Normocytic anemia Monitor H&H. Transfuse as needed.     Essential hypertension Continue amlodipine 5 mg p.o. daily. Continue metoprolol succinate 25 mg p.o. daily. Monitor BP and heart rate.     Chronic respiratory failure with hypoxia (HCC) In the setting of   Pulmonary fibrosis (HCC) Continue Pulmicort and bronchodilators.     Tachy-brady syndrome (HCC) Continue Toprol-XL 25 mg p.o. daily. Monitor blood pressure and heart rate.     Stage 3a chronic kidney disease (HCC) Monitor renal function and  electrolytes.     Type 2 diabetes mellitus without complication,    without long-term current use of insulin (HCC) Carbohydrate modified diet. CBG monitoring with RI SS.    DVT prophylaxis: Lovenox SQ. Code Status:   Full code. Family Communication:   Disposition Plan:   Patient is from:  [facility].  Anticipated DC to:  [facility].  Anticipated DC date:  08/17/2021.  Anticipated DC barriers: Clinical status.  Consults called:  Leron CroakJohn Poggi, MD. Admission status:  Inpatient/telemetry.  Severity of Illness: High severity in the setting of left prosthetic hip joint dislocation  complicated with left acetabular fracture.  Reubin Milan MD [facility]  How to contact the [facility] Attending or Consulting provider [place] or covering provider during after hours [place], for this patient?   Check the care team in [facility] and look for a) attending/consulting TR[facility] provider listed and b) the [facility] team listed Log into [email or web address] and use [place]'s universal password to access. If you do not have the password, please contact the hospital operator. Locate the [name] provider you are looking for under [facility] and page to a number that you can be directly reached. If you still have difficulty reaching the provider, please page the [facility] (Director on Call) for the Hospitalists listed on amion for assistance.  08/13/2021, 5:51 PM   This document was preparation Dragon voice recognition software and may contain some unintended transcription errors.

## 2021-08-13 NOTE — Op Note (Signed)
08/13/2021  4:56 PM  Patient:   Brittany Munoz  Pre-Op Diagnosis:   Closed posterior left prothesic hip dislocation.  Post-Op Diagnosis:   Same with displaced acetabular fracture.  Procedure:   Aborted attempted closed reduction of posterior left prosthetic hip dislocation.  Surgeon:   Maryagnes Amos, MD  Assistant:   None  Anesthesia:   IV sedation  Findings:   As above.  Complications:   None  EBL:   None  Fluids:   300 cc crystalloid  TT:   None  Drains:   None  Closure:   None  Implants:   None  Brief Clinical Note:   The patient is a 88 year old female who is now 10 weeks status post a cemented bipolar hemiarthroplasty.  Apparently, the patient was complaining of increased left hip pain and presented to [facility] emergency room where x-rays and a CT scan demonstrated  a nondisplaced left acetabular fracture on 07/19/2022.  The patient was managed nonsurgically, but continued to complain of left hip pain.  This morning, the patient was noted to have a deformity of her hip with increased pain.  An x-ray was obtained which demonstrated a posterior hip dislocation by report, so the patient was advised to go to the emergency room.  She was sent to [facility] emergency room by mistake then transferred to [facility].  She is brought to the operating room at this time for an attempted closed reduction under anesthesia of this dislocation.  Procedure:   The patient was brought into the operating room and lain in the supine position.  After adequate IV sedation was achieved, an AP view of the pelvis was obtained fluoroscopically.  This demonstrated a displaced acetabular fracture with a posterior superior dislocation of the prosthesis and possible absence of the posterior wall of the acetabulum.  Therefore, no attempt was made to try to reduce this prosthetic hip dislocation.  The patient was allowed to awaken and was returned to the recovery room in satisfactory condition after  tolerating the procedure well.

## 2021-08-13 NOTE — ED Triage Notes (Addendum)
Pt comes via [place] with c/o hip dislocation. Pt being taken to OR and plans for surgery at 4pm per MD Poggi.  VSS per [facility]. Pt did have covid swab completed. Pt is at baseline.  Pt wears 3L [place] chronically.

## 2021-08-13 NOTE — ED Provider Notes (Signed)
[facility] EMERGENCY DEPARTMENT Provider Note    CSN: [id number] Arrival date & time: 08/13/21 1243  History No chief complaint on file.   Brittany Munoz is a 88 y.o. female had a fall and hip fracture requiring arthroplasty by Dr. Joice Lofts at [facility] in November. She was subsequently discharged to SNF in [place]. She apparently had an xray done at her SNF yesterday showing a dislocation and was scheduled for OR closed reduction by Dr. Joice Lofts at [facility] today. EMS was called by the facility and she was brought here as the closest ED. She reports minimal pain at this time.    Home Medications Prior to Admission medications   Medication Sig Start Date End Date Taking? Authorizing Provider  acetaminophen (TYLENOL) 650 MG CR tablet Take 1,300 mg by mouth 2 (two) times daily.    [provider]  albuterol (VENTOLIN HFA) 108 (90 Base) MCG/ACT inhaler Inhale 2 puffs into the lungs every 4 (four) hours as needed for wheezing or shortness of breath.    [provider]  amLODipine (NORVASC) 5 MG tablet Take 5 mg by mouth daily. 05/01/21   [provider]  budesonide (PULMICORT) 0.25 MG/2ML nebulizer solution Inhale 0.25 mLs into the lungs in the morning and at bedtime. 09/17/20   [provider]  calcium carbonate (TUMS - DOSED IN MG ELEMENTAL CALCIUM) 500 MG chewable tablet Chew 2 tablets by mouth daily.    [provider]  cyanocobalamin (,VITAMIN B-12,) 1000 MCG/ML injection Inject 1,000 mcg into the muscle every 30 (thirty) days. 05/19/16   [provider]  DULoxetine (CYMBALTA) 60 MG capsule Take 60 mg by mouth daily. 10/18/14   [provider]  ferrous gluconate (FERGON) 240 (27 FE) MG tablet Take 1 tablet (240 mg total) by mouth 3 (three) times daily with meals. Patient taking differently: Take 240 mg by mouth every other day. 06/07/21   Charise Killian, MD  guaiFENesin (MUCINEX) 600 MG 12 hr tablet Take 600 mg by mouth 2 times daily at 12  noon and 4 pm.    [provider]  LORazepam (ATIVAN) 1 MG tablet Take 2 mg by mouth at bedtime.    [provider]  LORazepam (ATIVAN) 2 MG tablet Take 2 mg by mouth at bedtime. 07/05/21   [provider]  metoprolol succinate (TOPROL-XL) 25 MG 24 hr tablet Take 25 mg by mouth daily.    [provider]  montelukast (SINGULAIR) 10 MG tablet Take 10 mg by mouth at bedtime. 10/18/14   [provider]  omeprazole (PRILOSEC) 20 MG capsule Take 20 mg by mouth daily. 06/12/21   [provider]  oxyCODONE (OXY IR/ROXICODONE) 5 MG immediate release tablet Take 0.5 tablets (2.5 mg total) by mouth every 4 (four) hours as needed for breakthrough pain or severe pain. 07/20/21   Pokhrel, Rebekah Chesterfield, MD  PROAIR HFA 108 (90 Base) MCG/ACT inhaler USE 2 PUFFS EVERY 4 HOURS  AS NEEDED Patient taking differently: 2 puffs every 4 (four) hours as needed for wheezing or shortness of breath. 11/03/18   Erin Fulling, MD  SPIRIVA RESPIMAT 2.5 MCG/ACT AERS Inhale 2 puffs into the lungs daily. 07/09/21   [provider]  torsemide (DEMADEX) 20 MG tablet Take 10 mg by mouth daily. 07/06/14   [provider]  Vitamin D, Ergocalciferol, (DRISDOL) 50000 UNITS CAPS capsule Take 1 capsule by mouth once a week. 12/20/14   [provider]     Allergies  Aspirin, Cefuroxime axetil, Moxifloxacin hcl in nacl, Oxycodone, Sulfamethoxazole-trimethoprim, and Tramadol   Review of Systems   Review of Systems Please see HPI for pertinent positives and negatives  Physical Exam SpO2 98%   Physical Exam Vitals and nursing note reviewed.  Constitutional:      Appearance: Normal appearance.  HENT:     Head: Normocephalic and atraumatic.     Nose: Nose normal.     Mouth/Throat:     Mouth: Mucous membranes are moist.  Eyes:     Extraocular Movements: Extraocular movements intact.     Conjunctiva/sclera: Conjunctivae normal.  Cardiovascular:     Rate and  Rhythm: Normal rate.  Pulmonary:     Effort: Pulmonary effort is normal.     Breath sounds: Normal breath sounds.  Abdominal:     General: Abdomen is flat.     Palpations: Abdomen is soft.     Tenderness: There is no abdominal tenderness.  Musculoskeletal:        General: No swelling.     Cervical back: Neck supple.     Comments: L hip held in flexion and internal rotation, distally NVI  Skin:    General: Skin is warm and dry.  Neurological:     General: No focal deficit present.     Mental Status: She is alert.  Psychiatric:        Mood and Affect: Mood normal.    ED Results / Procedures / Treatments   EKG None  Procedures Procedures  Medications Ordered in the ED Medications - No data to display  Initial Impression and Plan  Spoke with Dr. Joice Lofts, Ortho at [facility] who is expecting her to the OR today. He requests she be transferred to the [facility] ED for pre-op. Spoke with Dr. Cyril Loosen, EDP at [facility] who will accept. Pre-op labs ordered, xray imaging ordered as none is available in our PACS.   ED Course   Clinical Course as of 08/13/21 1413  Tue Aug 13, 2021  1355 The pre-op labs/imaging were not collected prior to discharge. She also did not have her vitals documented yet although EMS reported no concerning values. [facility] ED aware.  [CS]  1412 Apparently the patient had not actually left yet, [facility] is at bedside. The patient has a POA, daughter-in-law, who I was able to speak with and she understands the plan and consents to transfer.  [CS]    Clinical Course User Index [CS] Pollyann Savoy, MD     MDM Rules/Calculators/A&P Medical Decision Making Amount and/or Complexity of Data Reviewed Independent Historian: EMS Labs: ordered. Radiology: ordered.  Risk Decision regarding hospitalization.    Final Clinical Impression(s) / ED Diagnoses Final diagnoses:  Dislocation of left hip, initial encounter [facility])    Rx / DC Orders ED Discharge Orders     None         Pollyann Savoy, MD 08/13/21 1356

## 2021-08-13 NOTE — Anesthesia Preprocedure Evaluation (Signed)
Anesthesia Evaluation  Patient identified by MRN, date of birth, ID band Patient awake    Reviewed: Allergy & Precautions, NPO status , Patient's Chart, lab work & pertinent test results  History of Anesthesia Complications Negative for: history of anesthetic complications  Airway Mallampati: III  TM Distance: >3 FB Neck ROM: full    Dental  (+) Lower Dentures, Upper Dentures   Pulmonary shortness of breath and with exertion, asthma , COPD, former smoker,    Pulmonary exam normal        Cardiovascular hypertension, (-) angina+CHF  Normal cardiovascular exam     Neuro/Psych PSYCHIATRIC DISORDERS negative neurological ROS     GI/Hepatic negative GI ROS, Neg liver ROS, neg GERD  ,  Endo/Other  diabetes, Type 2  Renal/GU Renal disease  negative genitourinary   Musculoskeletal  (+) Arthritis ,   Abdominal   Peds  Hematology negative hematology ROS (+)   Anesthesia Other Findings Past Medical History: No date: Anxiety No date: Asthma No date: Cardiomyopathy (HCC) No date: Cataract No date: COPD (chronic obstructive pulmonary disease) (HCC) No date: Degenerative arthritis No date: Depression No date: Diabetes (HCC) No date: Eczema No date: Gout No date: Hemorrhoids No date: Hyperlipidemia No date: Hypertension No date: Nocturnal hypoxia No date: Osteoporosis No date: Pulmonary fibrosis (HCC) No date: Seasonal allergies No date: Shingles  Past Surgical History: No date: BLADDER REPAIR No date: BREAST CYST REMOVAL No date: BROKEN ANKLE No date: CARDIAC CATHETERIZATION No date: CHOLECYSTECTOMY 06/04/2021: HIP ARTHROPLASTY; Left     Comment:  Procedure: ARTHROPLASTY BIPOLAR HIP (HEMIARTHROPLASTY);               Surgeon: Christena Flake, MD;  Location: [facility] ORS;                Service: Orthopedics;  Laterality: Left; No date: JOINT REPLACEMENT No date: KNEE ARTHROSCOPY No date: MOUTH SURGERY No date: OPEN  REDUCTION INTERNAL FIXATION (ORIF) DISTAL RADIAL  FRACTURE No date: REPAIR ROTATOR CUFF TEAR No date: SKIN CANCER REMOVED     Comment:  RT KNEE AND FOREHEAD No date: TONSILLECTOMY AND ADENOIDECTOMY No date: WISDOM TOOTH REMOVAL     Reproductive/Obstetrics negative OB ROS                             Anesthesia Physical Anesthesia Plan  ASA: 3  Anesthesia Plan: General   Post-op Pain Management:    Induction: Intravenous  PONV Risk Score and Plan: Propofol infusion and TIVA  Airway Management Planned: Natural Airway and Nasal Cannula  Additional Equipment:   Intra-op Plan:   Post-operative Plan:   Informed Consent: I have reviewed the patients History and Physical, chart, labs and discussed the procedure including the risks, benefits and alternatives for the proposed anesthesia with the patient or authorized representative who has indicated his/her understanding and acceptance.     Dental Advisory Given  Plan Discussed with: Anesthesiologist, CRNA and Surgeon  Anesthesia Plan Comments: (Patient and daughter consented for risks of anesthesia including but not limited to:  - adverse reactions to medications - risk of airway placement if required - damage to eyes, teeth, lips or other oral mucosa - nerve damage due to positioning  - sore throat or hoarseness - Damage to heart, brain, nerves, lungs, other parts of body or loss of life  They voiced understanding.)        Anesthesia Quick Evaluation

## 2021-08-14 ENCOUNTER — Encounter: Payer: Self-pay | Admitting: Surgery

## 2021-08-14 DIAGNOSIS — D75838 Other thrombocytosis: Secondary | ICD-10-CM

## 2021-08-14 DIAGNOSIS — J9611 Chronic respiratory failure with hypoxia: Secondary | ICD-10-CM

## 2021-08-14 DIAGNOSIS — E876 Hypokalemia: Secondary | ICD-10-CM

## 2021-08-14 DIAGNOSIS — S32402A Unspecified fracture of left acetabulum, initial encounter for closed fracture: Secondary | ICD-10-CM | POA: Diagnosis not present

## 2021-08-14 LAB — GLUCOSE, CAPILLARY
Glucose-Capillary: 82 mg/dL (ref 70–99)
Glucose-Capillary: 87 mg/dL (ref 70–99)
Glucose-Capillary: 109 mg/dL — ABNORMAL HIGH (ref 70–99)
Glucose-Capillary: 135 mg/dL — ABNORMAL HIGH (ref 70–99)
Glucose-Capillary: 132 mg/dL — ABNORMAL HIGH (ref 70–99)

## 2021-08-14 LAB — COMPREHENSIVE METABOLIC PANEL
ALT: 8 U/L (ref 0–44)
AST: 15 U/L (ref 15–41)
Albumin: 2.6 g/dL — ABNORMAL LOW (ref 3.5–5.0)
Alkaline Phosphatase: 95 U/L (ref 38–126)
Anion gap: 7 (ref 5–15)
BUN: 10 mg/dL (ref 8–23)
CO2: 29 mmol/L (ref 22–32)
Calcium: 8.7 mg/dL — ABNORMAL LOW (ref 8.9–10.3)
Chloride: 99 mmol/L (ref 98–111)
Creatinine, Ser: 0.6 mg/dL (ref 0.44–1.00)
GFR, Estimated: >60 mL/min (ref >60)
Glucose, Bld: 88 mg/dL (ref 70–99)
Potassium: 3.4 mmol/L — ABNORMAL LOW (ref 3.5–5.1)
Sodium: 135 mmol/L (ref 135–145)
Total Bilirubin: 0.6 mg/dL (ref 0.3–1.2)
Total Protein: 5.5 g/dL — ABNORMAL LOW (ref 6.5–8.1)

## 2021-08-14 LAB — CBC
HCT: 32 % — ABNORMAL LOW (ref 36.0–46.0)
Hemoglobin: 9.8 g/dL — ABNORMAL LOW (ref 12.0–15.0)
MCH: 24 pg — ABNORMAL LOW (ref 26.0–34.0)
MCHC: 30.6 g/dL (ref 30.0–36.0)
MCV: 78.2 fL — ABNORMAL LOW (ref 80.0–100.0)
Platelets: 404 10*3/uL — ABNORMAL HIGH (ref 150–400)
RBC: 4.09 MIL/uL (ref 3.87–5.11)
RDW: 16.4 % — ABNORMAL HIGH (ref 11.5–15.5)
WBC: 6.2 10*3/uL (ref 4.0–10.5)
nRBC: 0 % (ref 0.0–0.2)

## 2021-08-14 LAB — IRON AND TIBC
Iron: 26 ug/dL — ABNORMAL LOW (ref 28–170)
Saturation Ratios: 11 % (ref 10.4–31.8)
TIBC: 234 ug/dL — ABNORMAL LOW (ref 250–450)
UIBC: 208 ug/dL

## 2021-08-14 LAB — FERRITIN: Ferritin: 136 ng/mL (ref 11–307)

## 2021-08-14 LAB — VITAMIN B12: Vitamin B-12: 574 pg/mL (ref 180–914)

## 2021-08-14 MED ORDER — POTASSIUM CHLORIDE 20 MEQ PO PACK
40.0000 meq | PACK | Freq: Once | ORAL | Status: AC
  Administered 2021-08-14: 40 meq via ORAL
  Filled 2021-08-14: qty 2

## 2021-08-14 MED ORDER — CHLORHEXIDINE GLUCONATE CLOTH 2 % EX PADS
6.0000 | MEDICATED_PAD | Freq: Every day | CUTANEOUS | Status: DC
  Administered 2021-08-16 – 2021-08-19 (×4): 6 via TOPICAL

## 2021-08-14 NOTE — TOC Progression Note (Signed)
Transition of Care [facility]) - Progression Note    Patient Details  Name: Brittany Munoz MRN: [id number] Date of Birth: Mar 15, 1928  Transition of Care [facility]) CM/SW Contact  Marlowe Sax, RN Phone Number: 08/14/2021, 10:08 AM  Clinical Narrative:    The patient will have surgery, then PT to eval, TOC to continue to follow and assist with DC planning and needs        Expected Discharge Plan and Services                                                 Social Determinants of Health (SDOH) Interventions    Readmission Risk Interventions No flowsheet data found.

## 2021-08-14 NOTE — Progress Notes (Signed)
Patient ID: Brittany Munoz, female   DOB: [birth year], 88 y.o.   MRN: [id number]  Subjective: No new complaints pertaining to the left hip.   Objective: Vital signs in last 24 hours: Temp:  [97 F (36.1 C)-98.7 F (37.1 C)] 98.2 F (36.8 C) (01/11 0303) Pulse Rate:  [70-95] 82 (01/11 0303) Resp:  [11-46] 15 (01/11 0303) BP: (110-147)/(46-94) 121/46 (01/11 0303) SpO2:  [94 %-100 %] 97 % (01/11 0303)  Intake/Output from previous day: 01/10 0701 - 01/11 0700 In: 650 [I.V.:600; IV Piggyback:50] Out: 1 [Stool:1] Intake/Output this shift: No intake/output data recorded.  Recent Labs    08/14/21 0507  HGB 9.8*   Recent Labs    08/14/21 0507  WBC 6.2  RBC 4.09  HCT 32.0*  PLT 404*   Recent Labs    08/14/21 0507  NA 135  K 3.4*  CL 99  CO2 29  BUN 10  CREATININE 0.60  GLUCOSE 88  CALCIUM 8.7*   No results for input(s): LABPT, INR in the last 72 hours.  Physical Exam: Orthopedic examination again is limited to the left hip and lower extremity.  The findings are unchanged as compared to yesterday.  Assessment: Displaced left acetabular fracture with prosthetic hip dislocation.  Plan: I have had numerous discussions with the patient, her daughter-in-law who is the power of attorney for the patient, a traumatologist at [facility], and a total joint reconstructive expert at [facility].  There basically are 3 options for this patient: To leave her as she is, to perform a resection arthroplasty and leave her without a hip joint, or to perform a major reconstructive procedure of the hip and pelvis.  The consensus from all is that the patient is not a good candidate for the third option, given her age, level of confusion, and poor bone quality.  The daughter and her mother do not feel that option #1 is a good option either as the patient is an quite a bit of pain.    Therefore, we will proceed with option #2, a resection arthroplasty, in the hopes that this will make her more  comfortable and allow her to tolerate sitting and transfers more easily and comfortably.  I will plan on doing this procedure tomorrow afternoon, assuming she is stable medically.  The risks (including bleeding, infection, nerve and/or blood vessel injury, persistent or recurrent pain, loosening or failure of the components, leg length inequality, dislocation, need for further surgery, blood clots, strokes, heart attacks or arrhythmias, pneumonia, etc.) and benefits of the surgical procedure were discussed.  The patient's daughter states her understanding and agrees to proceed on the patient's behalf.  A formal written consent will be obtained by the nursing staff.   Marshall Cork Jeanie Mccard 08/14/2021, 7:21 AM

## 2021-08-14 NOTE — Progress Notes (Signed)
PROGRESS NOTE    Brittany Munoz  [id number] DOB: 25-Jul-1928 DOA: 08/13/2021 PCP: Leonel Ramsay, MD   Follow-up on pelvic fracture with dislocation. Brief Narrative:  Brittany Munoz is a 88 y.o. female with medical history significant of anxiety, asthma, nonspecified cardiomyopathy, cataracts, COPD, osteoarthritis, depression, type II DM, eczema, gout, hemorrhoids, hyperlipidemia, hypertension, nocturnal hypoxia, osteoporosis, pulmonary fibrosis, seasonal allergies, varicella-zoster who I am familiar with after she went to the emergency department [facility] due to left hip pain X-ray of the hip showed dislocated hip joint with acetabular fracture.  Patient is seen by orthopedics, decision is made for resection arthroplasty, scheduled on 1/12   Assessment & Plan:   Principal Problem:   Acetabulum fracture, left ([place]) Active Problems:   Normocytic anemia   Essential hypertension   Pulmonary fibrosis (HCC)   Tachy-brady syndrome (HCC)   Chronic respiratory failure with hypoxia (HCC)   Stage 3a chronic kidney disease ([place])   Type 2 diabetes mellitus without complication, without long-term current use of insulin (HCC)   Hypokalemia   Reactive thrombocytosis  Acetabulum fracture, left (HCC) Left hip dislocation. Surgery scheduled for tomorrow.  We will keep patient n.p.o. after midnight.  Anemia. Reactive thrombocytosis. Check iron, ferritin and B12 level.  Pulmonary fibrosis. Chronic respiratory failure with hypoxemia. Continue oxygen treatment, currently condition is stable.  Chronic kidney disease stage IIIa. Hypokalemia. Continue to monitor.  Replete potassium  Type 2 diabetes  Continue sliding scale insulin.  Tachybradycardia syndrome. Patient currently on Toprol 25 mg daily.  Continue telemetry.   DVT prophylaxis: Lovenox Code Status: full Family Communication:  Disposition Plan:    Status is: Inpatient  Remains inpatient appropriate  because: Pending surgery.        I/O last 3 completed shifts: In: 650 [I.V.:600; IV Piggyback:50] Out: 1 [Stool:1] No intake/output data recorded.     Consultants:  Orthopedcics  Procedures: pending  Antimicrobials:None  Subjective: Patient doing well today, she has good appetite.  No nausea vomiting abdominal pain. No significant pain in the hip. No short of breath or cough.  Objective: Vitals:   08/14/21 0303 08/14/21 0749 08/14/21 0811 08/14/21 1229  BP: (!) 121/46  (!) 144/84 140/70  Pulse: 82  94 84  Resp: 15  18 18   Temp: 98.2 F (36.8 C)  98.4 F (36.9 C) 98.2 F (36.8 C)  TempSrc:      SpO2: 97% 94% 98% 97%    Intake/Output Summary (Last 24 hours) at 08/14/2021 1308 Last data filed at 08/13/2021 1800 Gross per 24 hour  Intake 650 ml  Output 1 ml  Net 649 ml   There were no vitals filed for this visit.  Examination:  General exam: Appears calm and comfortable  Respiratory system: Clear to auscultation. Respiratory effort normal. Cardiovascular system: S1 & S2 heard, RRR. No JVD, murmurs, rubs, gallops or clicks. No pedal edema. Gastrointestinal system: Abdomen is nondistended, soft and nontender. No organomegaly or masses felt. Normal bowel sounds heard. Central nervous system: Alert and oriented x2. No focal neurological deficits. Extremities: Symmetric 5 x 5 power. Skin: No rashes, lesions or ulcers Psychiatry: Judgement and insight appear normal. Mood & affect appropriate.     Data Reviewed: I have personally reviewed following labs and imaging studies  CBC: Recent Labs  Lab 08/14/21 0507  WBC 6.2  HGB 9.8*  HCT 32.0*  MCV 78.2*  PLT [id number]*   Basic Metabolic Panel: Recent Labs  Lab 08/14/21 0507  NA 135  K 3.4*  CL 99  CO2 29  GLUCOSE 88  BUN 10  CREATININE 0.60  CALCIUM 8.7*   GFR: CrCl cannot be calculated (Unknown ideal weight.). Liver Function Tests: Recent Labs  Lab 08/14/21 0507  AST 15  ALT 8  ALKPHOS 95   BILITOT 0.6  PROT 5.5*  ALBUMIN 2.6*   No results for input(s): LIPASE, AMYLASE in the last 168 hours. No results for input(s): AMMONIA in the last 168 hours. Coagulation Profile: No results for input(s): INR, PROTIME in the last 168 hours. Cardiac Enzymes: No results for input(s): CKTOTAL, CKMB, CKMBINDEX, TROPONINI in the last 168 hours. BNP (last 3 results) No results for input(s): PROBNP in the last 8760 hours. HbA1C: No results for input(s): HGBA1C in the last 72 hours. CBG: Recent Labs  Lab 08/13/21 1557 08/13/21 1710 08/14/21 0307 08/14/21 0813  GLUCAP 94 85 82 87   Lipid Profile: No results for input(s): CHOL, HDL, LDLCALC, TRIG, CHOLHDL, LDLDIRECT in the last 72 hours. Thyroid Function Tests: No results for input(s): TSH, T4TOTAL, FREET4, T3FREE, THYROIDAB in the last 72 hours. Anemia Panel: No results for input(s): VITAMINB12, FOLATE, FERRITIN, TIBC, IRON, RETICCTPCT in the last 72 hours. Sepsis Labs: No results for input(s): PROCALCITON, LATICACIDVEN in the last 168 hours.  Recent Results (from the past 240 hour(s))  Resp Panel by RT-PCR (Flu A&B, Covid) Nasopharyngeal Swab     Status: None   Collection Time: 08/13/21  2:17 PM   Specimen: Nasopharyngeal Swab; Nasopharyngeal(NP) swabs in vial transport medium  Result Value Ref Range Status   SARS Coronavirus 2 by RT PCR NEGATIVE NEGATIVE Final    Comment: (NOTE) SARS-CoV-2 target nucleic acids are NOT DETECTED.  The SARS-CoV-2 RNA is generally detectable in upper respiratory specimens during the acute phase of infection. The lowest concentration of SARS-CoV-2 viral copies this assay can detect is 138 copies/mL. A negative result does not preclude SARS-Cov-2 infection and should not be used as the sole basis for treatment or other patient management decisions. A negative result may occur with  improper specimen collection/handling, submission of specimen other than nasopharyngeal swab, presence of viral  mutation(s) within the areas targeted by this assay, and inadequate number of viral copies(<138 copies/mL). A negative result must be combined with clinical observations, patient history, and epidemiological information. The expected result is Negative.  Fact Sheet for Patients:  EntrepreneurPulse.com.au  Fact Sheet for Healthcare Providers:  IncredibleEmployment.be  This test is no t yet approved or cleared by the [place] FDA and  has been authorized for detection and/or diagnosis of SARS-CoV-2 by FDA under an Emergency Use Authorization (EUA). This EUA will remain  in effect (meaning this test can be used) for the duration of the COVID-19 declaration under Section 564(b)(1) of the Act, 21 U.S.C.section 360bbb-3(b)(1), unless the authorization is terminated  or revoked sooner.       Influenza A by PCR NEGATIVE NEGATIVE Final   Influenza B by PCR NEGATIVE NEGATIVE Final    Comment: (NOTE) The Xpert Xpress SARS-CoV-2/FLU/RSV plus assay is intended as an aid in the diagnosis of influenza from Nasopharyngeal swab specimens and should not be used as a sole basis for treatment. Nasal washings and aspirates are unacceptable for Xpert Xpress SARS-CoV-2/FLU/RSV testing.  Fact Sheet for Patients: EntrepreneurPulse.com.au  Fact Sheet for Healthcare Providers: IncredibleEmployment.be  This test is not yet approved or cleared by the [place] FDA and has been authorized for detection and/or diagnosis of SARS-CoV-2 by FDA under an Emergency Use Authorization (EUA). This  EUA will remain in effect (meaning this test can be used) for the duration of the COVID-19 declaration under Section 564(b)(1) of the Act, 21 U.S.C. section 360bbb-3(b)(1), unless the authorization is terminated or revoked.  Performed at [facility], [place] [address] [place], [place] [address]          Radiology  Studies: DG C-Arm 1-60 Min-No Report  Result Date: 08/13/2021 Fluoroscopy was utilized by the requesting physician.  No radiographic interpretation.   DG HIP PORT UNILAT WITH PELVIS 1V LEFT  Result Date: 08/13/2021 CLINICAL DATA:  Hip reduction EXAM: DG HIP (WITH OR WITHOUT PELVIS) 1V PORT LEFT COMPARISON:  08/13/2021, 06/04/2021 FINDINGS: Two low resolution intraoperative spot views of the left hip. Total fluoroscopy time was 5.2 seconds, fluoroscopy dose 1.26 mGy. The images demonstrate a left hip replacement with complete dislocation of the acetabular components in a superior direction, the acetabular component articulates with the femoral component. There is a displaced left acetabular fracture IMPRESSION: Intraoperative fluoroscopic assistance provided during planned hip reduction which was not performed. There is a dislocated left hip replacement that involves both the acetabular cup and the femoral prosthesis. There is a fracture involving the acetabulum Electronically Signed   By: Donavan Foil M.D.   On: 08/13/2021 17:26        Scheduled Meds:  amLODipine  5 mg Oral Daily   budesonide  0.25 mg Inhalation BID   calcium carbonate  2 tablet Oral Daily   DULoxetine  20 mg Oral BID   enoxaparin (LOVENOX) injection  40 mg Subcutaneous Q24H   ferrous gluconate  324 mg Oral QODAY   insulin aspart  0-9 Units Subcutaneous TID WC   metoprolol succinate  25 mg Oral Daily   montelukast  10 mg Oral QHS   pantoprazole  40 mg Oral Daily   potassium chloride  40 mEq Oral Once   tiotropium  18 mcg Inhalation Daily   torsemide  10 mg Oral Daily   traMADol  25 mg Oral BID   Continuous Infusions:  sodium chloride       LOS: 1 day    Time spent: 28 minutes    Sharen Hones, MD [facility]   To contact the attending provider between 7A-7P or the covering provider during after hours 7P-7A, please log into the web site [email or web address] and access using universal  password for  that web site. If you do not have the password, please call the hospital operator.  08/14/2021, 1:08 PM

## 2021-08-15 ENCOUNTER — Inpatient Hospital Stay: Payer: Medicare Other | Admitting: Certified Registered"

## 2021-08-15 ENCOUNTER — Encounter
Admission: RE | Disposition: A | Discharge status: Skilled Nursing Facility | Payer: Self-pay | Source: Home / Self Care | Attending: Internal Medicine

## 2021-08-15 ENCOUNTER — Encounter: Payer: Self-pay | Admitting: *Deleted

## 2021-08-15 ENCOUNTER — Inpatient Hospital Stay: Payer: Medicare Other

## 2021-08-15 DIAGNOSIS — J8410 Pulmonary fibrosis, unspecified: Secondary | ICD-10-CM

## 2021-08-15 DIAGNOSIS — S32402A Unspecified fracture of left acetabulum, initial encounter for closed fracture: Secondary | ICD-10-CM | POA: Diagnosis not present

## 2021-08-15 DIAGNOSIS — J9611 Chronic respiratory failure with hypoxia: Secondary | ICD-10-CM | POA: Diagnosis not present

## 2021-08-15 DIAGNOSIS — L8990 Pressure ulcer of unspecified site, unspecified stage: Secondary | ICD-10-CM | POA: Insufficient documentation

## 2021-08-15 HISTORY — PX: TOTAL HIP REVISION: SHX763

## 2021-08-15 LAB — BASIC METABOLIC PANEL
Anion gap: 6 (ref 5–15)
BUN: 13 mg/dL (ref 8–23)
CO2: 32 mmol/L (ref 22–32)
Calcium: 9 mg/dL (ref 8.9–10.3)
Chloride: 100 mmol/L (ref 98–111)
Creatinine, Ser: 0.67 mg/dL (ref 0.44–1.00)
GFR, Estimated: >60 mL/min (ref >60)
Glucose, Bld: 102 mg/dL — ABNORMAL HIGH (ref 70–99)
Potassium: 3.8 mmol/L (ref 3.5–5.1)
Sodium: 138 mmol/L (ref 135–145)

## 2021-08-15 LAB — GLUCOSE, CAPILLARY
Glucose-Capillary: 100 mg/dL — ABNORMAL HIGH (ref 70–99)
Glucose-Capillary: 87 mg/dL (ref 70–99)
Glucose-Capillary: 96 mg/dL (ref 70–99)
Glucose-Capillary: 109 mg/dL — ABNORMAL HIGH (ref 70–99)

## 2021-08-15 LAB — CBC WITH DIFFERENTIAL/PLATELET
Abs Immature Granulocytes: 0.07 10*3/uL (ref 0.00–0.07)
Basophils Absolute: 0.1 10*3/uL (ref 0.0–0.1)
Basophils Relative: 1 %
Eosinophils Absolute: 0.2 10*3/uL (ref 0.0–0.5)
Eosinophils Relative: 2 %
HCT: 31.9 % — ABNORMAL LOW (ref 36.0–46.0)
Hemoglobin: 9.8 g/dL — ABNORMAL LOW (ref 12.0–15.0)
Immature Granulocytes: 1 %
Lymphocytes Relative: 23 %
Lymphs Abs: 1.5 10*3/uL (ref 0.7–4.0)
MCH: 24.3 pg — ABNORMAL LOW (ref 26.0–34.0)
MCHC: 30.7 g/dL (ref 30.0–36.0)
MCV: 79 fL — ABNORMAL LOW (ref 80.0–100.0)
Monocytes Absolute: 0.7 10*3/uL (ref 0.1–1.0)
Monocytes Relative: 11 %
Neutro Abs: 3.9 10*3/uL (ref 1.7–7.7)
Neutrophils Relative %: 62 %
Platelets: 384 10*3/uL (ref 150–400)
RBC: 4.04 MIL/uL (ref 3.87–5.11)
RDW: 16.3 % — ABNORMAL HIGH (ref 11.5–15.5)
WBC: 6.4 10*3/uL (ref 4.0–10.5)
nRBC: 0 % (ref 0.0–0.2)

## 2021-08-15 LAB — PREPARE RBC (CROSSMATCH)

## 2021-08-15 LAB — SURGICAL PCR SCREEN
MRSA, PCR: NEGATIVE
Staphylococcus aureus: NEGATIVE

## 2021-08-15 SURGERY — TOTAL HIP REVISION
Anesthesia: Monitor Anesthesia Care | Site: Hip | Laterality: Left

## 2021-08-15 MED ORDER — VANCOMYCIN HCL 1000 MG IV SOLR
INTRAVENOUS | Status: AC
  Filled 2021-08-15: qty 20

## 2021-08-15 MED ORDER — SODIUM CHLORIDE (PF) 0.9 % IJ SOLN
INTRAMUSCULAR | Status: AC
  Filled 2021-08-15: qty 50

## 2021-08-15 MED ORDER — FLEET ENEMA 7-19 GM/118ML RE ENEM: 1.0000 | ENEMA | Freq: Once | RECTAL | Status: DC | PRN

## 2021-08-15 MED ORDER — BUPIVACAINE LIPOSOME 1.3 % IJ SUSP
INTRAMUSCULAR | Status: AC
  Filled 2021-08-15: qty 20

## 2021-08-15 MED ORDER — FENTANYL CITRATE (PF) 100 MCG/2ML IJ SOLN: 25.0000 ug | INTRAMUSCULAR | Status: DC | PRN

## 2021-08-15 MED ORDER — MAGNESIUM HYDROXIDE 400 MG/5ML PO SUSP
30.0000 mL | Freq: Every day | ORAL | Status: DC | PRN
  Administered 2021-08-18: 30 mL via ORAL
  Filled 2021-08-15: qty 30

## 2021-08-15 MED ORDER — KETAMINE HCL 50 MG/5ML IJ SOSY
PREFILLED_SYRINGE | INTRAMUSCULAR | Status: AC
  Filled 2021-08-15: qty 5

## 2021-08-15 MED ORDER — ACETAMINOPHEN 10 MG/ML IV SOLN: 1000.0000 mg | Freq: Once | INTRAVENOUS | Status: DC | PRN

## 2021-08-15 MED ORDER — ACETAMINOPHEN 500 MG PO TABS
500.0000 mg | ORAL_TABLET | Freq: Four times a day (QID) | ORAL | Status: AC
  Administered 2021-08-16 (×2): 500 mg via ORAL
  Filled 2021-08-15 (×2): qty 1

## 2021-08-15 MED ORDER — SODIUM CHLORIDE 0.9 % IV SOLN
INTRAVENOUS | Status: DC
  Administered 2021-08-15: 1000 mL via INTRAVENOUS

## 2021-08-15 MED ORDER — KETAMINE HCL 10 MG/ML IJ SOLN
INTRAMUSCULAR | Status: DC | PRN
  Administered 2021-08-15 (×3): 10 mg via INTRAVENOUS

## 2021-08-15 MED ORDER — BISACODYL 10 MG RE SUPP
10.0000 mg | Freq: Every day | RECTAL | Status: DC | PRN
  Filled 2021-08-15: qty 1

## 2021-08-15 MED ORDER — SODIUM CHLORIDE FLUSH 0.9 % IV SOLN
INTRAVENOUS | Status: AC
  Filled 2021-08-15: qty 10

## 2021-08-15 MED ORDER — CEFAZOLIN SODIUM-DEXTROSE 2-4 GM/100ML-% IV SOLN
INTRAVENOUS | Status: AC
  Filled 2021-08-15: qty 100

## 2021-08-15 MED ORDER — METOCLOPRAMIDE HCL 10 MG PO TABS: 5.0000 mg | ORAL_TABLET | Freq: Three times a day (TID) | ORAL | Status: DC | PRN

## 2021-08-15 MED ORDER — PROPOFOL 1000 MG/100ML IV EMUL
INTRAVENOUS | Status: AC
  Filled 2021-08-15: qty 100

## 2021-08-15 MED ORDER — DIPHENHYDRAMINE HCL 12.5 MG/5ML PO ELIX
12.5000 mg | ORAL_SOLUTION | ORAL | Status: DC | PRN
  Filled 2021-08-15: qty 10

## 2021-08-15 MED ORDER — METOCLOPRAMIDE HCL 5 MG/ML IJ SOLN: 5.0000 mg | Freq: Three times a day (TID) | INTRAMUSCULAR | Status: DC | PRN

## 2021-08-15 MED ORDER — ACETAMINOPHEN 325 MG PO TABS
325.0000 mg | ORAL_TABLET | Freq: Four times a day (QID) | ORAL | Status: DC | PRN
  Administered 2021-08-18: 650 mg via ORAL
  Filled 2021-08-15 (×2): qty 2

## 2021-08-15 MED ORDER — PHENYLEPHRINE HCL-NACL 20-0.9 MG/250ML-% IV SOLN
INTRAVENOUS | Status: DC | PRN
  Administered 2021-08-15: 10 ug/min via INTRAVENOUS

## 2021-08-15 MED ORDER — BUPIVACAINE HCL (PF) 0.5 % IJ SOLN
INTRAMUSCULAR | Status: DC | PRN
Start: 2021-08-15 — End: 2021-08-15
  Administered 2021-08-15: 2.9 mL via INTRATHECAL

## 2021-08-15 MED ORDER — ACETAMINOPHEN 10 MG/ML IV SOLN
INTRAVENOUS | Status: DC | PRN
  Administered 2021-08-15: 1000 mg via INTRAVENOUS

## 2021-08-15 MED ORDER — FENTANYL CITRATE (PF) 100 MCG/2ML IJ SOLN
INTRAMUSCULAR | Status: AC
  Filled 2021-08-15: qty 2

## 2021-08-15 MED ORDER — ENOXAPARIN SODIUM 40 MG/0.4ML IJ SOSY
40.0000 mg | PREFILLED_SYRINGE | INTRAMUSCULAR | Status: DC
  Administered 2021-08-16 – 2021-08-19 (×4): 40 mg via SUBCUTANEOUS
  Filled 2021-08-15 (×4): qty 0.4

## 2021-08-15 MED ORDER — ONDANSETRON HCL 4 MG/2ML IJ SOLN: 4.0000 mg | Freq: Four times a day (QID) | INTRAMUSCULAR | Status: DC | PRN

## 2021-08-15 MED ORDER — LACTATED RINGERS IV SOLN: INTRAVENOUS | Status: DC

## 2021-08-15 MED ORDER — SODIUM CHLORIDE (PF) 0.9 % IJ SOLN
INTRAMUSCULAR | Status: DC | PRN
  Administered 2021-08-15: 60 mL

## 2021-08-15 MED ORDER — BUPIVACAINE-EPINEPHRINE (PF) 0.25% -1:200000 IJ SOLN
INTRAMUSCULAR | Status: AC
  Filled 2021-08-15: qty 30

## 2021-08-15 MED ORDER — ONDANSETRON HCL 4 MG/2ML IJ SOLN: 4.0000 mg | Freq: Once | INTRAMUSCULAR | Status: DC | PRN

## 2021-08-15 MED ORDER — NEOMYCIN-POLYMYXIN B GU 40-200000 IR SOLN
Status: AC
  Filled 2021-08-15: qty 20

## 2021-08-15 MED ORDER — CEFAZOLIN SODIUM-DEXTROSE 2-4 GM/100ML-% IV SOLN
2.0000 g | Freq: Four times a day (QID) | INTRAVENOUS | Status: AC
  Administered 2021-08-15 – 2021-08-16 (×3): 2 g via INTRAVENOUS
  Filled 2021-08-15 (×3): qty 100

## 2021-08-15 MED ORDER — TRANEXAMIC ACID 1000 MG/10ML IV SOLN
INTRAVENOUS | Status: AC
  Filled 2021-08-15: qty 10

## 2021-08-15 MED ORDER — PHENYLEPHRINE HCL (PRESSORS) 10 MG/ML IV SOLN
INTRAVENOUS | Status: DC | PRN
  Administered 2021-08-15: 160 ug via INTRAVENOUS
  Administered 2021-08-15 (×2): 80 ug via INTRAVENOUS

## 2021-08-15 MED ORDER — TRANEXAMIC ACID-NACL 1000-0.7 MG/100ML-% IV SOLN
INTRAVENOUS | Status: DC | PRN
  Administered 2021-08-15: 1000 mg via INTRAVENOUS

## 2021-08-15 MED ORDER — ONDANSETRON HCL 4 MG PO TABS
4.0000 mg | ORAL_TABLET | Freq: Four times a day (QID) | ORAL | Status: DC | PRN
  Administered 2021-08-16: 4 mg via ORAL
  Filled 2021-08-15: qty 1

## 2021-08-15 MED ORDER — PROPOFOL 500 MG/50ML IV EMUL
INTRAVENOUS | Status: DC | PRN
Start: 2021-08-15 — End: 2021-08-15
  Administered 2021-08-15: 20 ug/kg/min via INTRAVENOUS

## 2021-08-15 MED ORDER — DOCUSATE SODIUM 100 MG PO CAPS
100.0000 mg | ORAL_CAPSULE | Freq: Two times a day (BID) | ORAL | Status: DC
  Administered 2021-08-15 – 2021-08-19 (×8): 100 mg via ORAL
  Filled 2021-08-15 (×8): qty 1

## 2021-08-15 MED ORDER — CEFAZOLIN SODIUM-DEXTROSE 2-4 GM/100ML-% IV SOLN
2.0000 g | Freq: Once | INTRAVENOUS | Status: AC
  Administered 2021-08-15: 2 g via INTRAVENOUS

## 2021-08-15 MED ORDER — NEOMYCIN-POLYMYXIN B GU 40-200000 IR SOLN
Status: DC | PRN
  Administered 2021-08-15: 2800 mL
  Administered 2021-08-15: 200 mL

## 2021-08-15 SURGICAL SUPPLY — 47 items
BLADE SURG SZ20 CARB STEEL (BLADE) ×2 IMPLANT
BNDG COHESIVE 6X5 TAN ST LF (GAUZE/BANDAGES/DRESSINGS) ×2 IMPLANT
BUR DISC 0.8X25 (BURR) IMPLANT
CARTRIDGE OIL MAESTRO DRILL (MISCELLANEOUS) IMPLANT
CHLORAPREP W/TINT 26 (MISCELLANEOUS) ×4 IMPLANT
COVER BACK TABLE REUSABLE LG (DRAPES) ×2 IMPLANT
COVER MAYO STAND REUSABLE (DRAPES) ×2 IMPLANT
DRAPE 3/4 80X56 (DRAPES) ×2 IMPLANT
DRAPE INCISE IOBAN 66X60 STRL (DRAPES) ×2 IMPLANT
DRAPE ORTHO SPLIT 77X108 STRL (DRAPES) ×2
DRAPE SURG 17X11 SM STRL (DRAPES) ×4 IMPLANT
DRAPE SURG ORHT 6 SPLT 77X108 (DRAPES) ×2 IMPLANT
DRSG OPSITE POSTOP 4X10 (GAUZE/BANDAGES/DRESSINGS) ×1 IMPLANT
ELECT CAUTERY BLADE 6.4 (BLADE) ×2 IMPLANT
Exodus Hip revision Kit ×1 IMPLANT
GAUZE XEROFORM 1X8 LF (GAUZE/BANDAGES/DRESSINGS) ×1 IMPLANT
GLOVE SRG 8 PF TXTR STRL LF DI (GLOVE) ×1 IMPLANT
GLOVE SURG ENC MOIS LTX SZ7.5 (GLOVE) ×4 IMPLANT
GLOVE SURG ENC MOIS LTX SZ8 (GLOVE) ×4 IMPLANT
GLOVE SURG UNDER LTX SZ8 (GLOVE) ×2 IMPLANT
GLOVE SURG UNDER POLY LF SZ8 (GLOVE) ×1
GOWN STRL REUS W/ TWL LRG LVL3 (GOWN DISPOSABLE) ×2 IMPLANT
GOWN STRL REUS W/ TWL XL LVL3 (GOWN DISPOSABLE) ×1 IMPLANT
GOWN STRL REUS W/TWL LRG LVL3 (GOWN DISPOSABLE) ×2
GOWN STRL REUS W/TWL XL LVL3 (GOWN DISPOSABLE) ×1
HOLSTER ELECTROSUGICAL PENCIL (MISCELLANEOUS) ×2 IMPLANT
IV NS IRRIG 3000ML ARTHROMATIC (IV SOLUTION) ×2 IMPLANT
KIT HIP STEM REMOVAL BLADES (KITS) ×1 IMPLANT
KIT TURNOVER KIT A (KITS) ×2 IMPLANT
MANIFOLD NEPTUNE II (INSTRUMENTS) ×2 IMPLANT
NDL SPNL 20GX3.5 QUINCKE YW (NEEDLE) ×1 IMPLANT
NEEDLE SPNL 20GX3.5 QUINCKE YW (NEEDLE) ×2 IMPLANT
NS IRRIG 1000ML POUR BTL (IV SOLUTION) ×2 IMPLANT
OIL CARTRIDGE MAESTRO DRILL (MISCELLANEOUS)
OSTEOTOME U 10.5X8 (ORTHOPEDIC DISPOSABLE SUPPLIES) ×1 IMPLANT
PACK HIP PROSTHESIS (MISCELLANEOUS) ×2 IMPLANT
PULSAVAC PLUS IRRIG FAN TIP (DISPOSABLE) ×2
SLEEVE PROTECTION STRL DISP (MISCELLANEOUS) ×1 IMPLANT
SPONGE T-LAP 18X18 ~~LOC~~+RFID (SPONGE) ×8 IMPLANT
STAPLER SKIN PROX 35W (STAPLE) ×2 IMPLANT
SUT VIC AB 1 CT1 36 (SUTURE) ×4 IMPLANT
SUT VIC AB 1 CTX 27 (SUTURE) ×2 IMPLANT
SUT VIC AB 2-0 CT1 (SUTURE) ×4 IMPLANT
SUT VIC AB 2-0 CT1 27 (SUTURE) ×4
SUT VIC AB 2-0 CT1 TAPERPNT 27 (SUTURE) ×4 IMPLANT
SYR 30ML LL (SYRINGE) ×3 IMPLANT
TIP FAN IRRIG PULSAVAC PLUS (DISPOSABLE) ×1 IMPLANT

## 2021-08-15 NOTE — Transfer of Care (Signed)
Immediate Anesthesia Transfer of Care Note  Patient: Brittany Munoz  Procedure(s) Performed: TOTAL HIP DIS-ARTHROPLASTY (Left: Hip)  Patient Location: PACU  Anesthesia Type:MAC and Spinal  Level of Consciousness: awake, alert  and oriented  Airway & Oxygen Therapy: Patient Spontanous Breathing and Patient connected to face mask oxygen  Post-op Assessment: Report given to RN and Post -op Vital signs reviewed and stable  Post vital signs: Reviewed and stable  Last Vitals:  Vitals Value Taken Time  BP 119/76 08/15/21 1848  Temp    Pulse 89 08/15/21 1851  Resp 22 08/15/21 1851  SpO2 100 % 08/15/21 1851  Vitals shown include unvalidated device data.  Last Pain:  Vitals:   08/15/21 1511  TempSrc: Temporal  PainSc:       Patients Stated Pain Goal: 2 (02/98/47 3085)  Complications: No notable events documented.

## 2021-08-15 NOTE — Anesthesia Preprocedure Evaluation (Addendum)
Anesthesia Evaluation  Patient identified by MRN, date of birth, ID band Patient awake    Reviewed: Allergy & Precautions, NPO status , Patient's Chart, lab work & pertinent test results  History of Anesthesia Complications Negative for: history of anesthetic complications  Airway Mallampati: III   Neck ROM: Full    Dental  (+) Upper Dentures   Pulmonary asthma , COPD,  oxygen dependent, former smoker (quit 1965),  Pulmonary fibrosis   Pulmonary exam normal breath sounds clear to auscultation       Cardiovascular hypertension, +CHF (cardiomyopathy)  Normal cardiovascular exam Rhythm:Regular Rate:Normal  ECG 06/04/21: Sinus tachycardia (HR 115) with PACs; T wave abnormality   Neuro/Psych PSYCHIATRIC DISORDERS Anxiety Depression negative neurological ROS     GI/Hepatic negative GI ROS,   Endo/Other  diabetes  Renal/GU Renal disease (stage III CKD)     Musculoskeletal Gout    Abdominal   Peds  Hematology negative hematology ROS (+)   Anesthesia Other Findings   Reproductive/Obstetrics                           Anesthesia Physical Anesthesia Plan  ASA: 3  Anesthesia Plan: General and Spinal   Post-op Pain Management:    Induction: Intravenous  PONV Risk Score and Plan: 3 and Propofol infusion, TIVA, Treatment may vary due to age or medical condition and Ondansetron  Airway Management Planned: Natural Airway  Additional Equipment:   Intra-op Plan:   Post-operative Plan:   Informed Consent: I have reviewed the patients History and Physical, chart, labs and discussed the procedure including the risks, benefits and alternatives for the proposed anesthesia with the patient or authorized representative who has indicated his/her understanding and acceptance.     Consent reviewed with POA  Plan Discussed with: CRNA  Anesthesia Plan Comments: (Plan for spinal and GA with natural  airway, LMA/GETA backup.  Patient's POA (daughter in law) consented for risks of anesthesia including but not limited to:  - adverse reactions to medications - damage to eyes, teeth, lips or other oral mucosa - nerve damage due to positioning  - sore throat or hoarseness - headache, bleeding, infection, nerve damage 2/2 spinal - damage to heart, brain, nerves, lungs, other parts of body or loss of life  Informed POA about role of CRNA in peri- and intra-operative care.  POA voiced understanding.)      Anesthesia Quick Evaluation

## 2021-08-15 NOTE — TOC Progression Note (Signed)
Transition of Care [facility]) - Progression Note    Patient Details  Name: Brittany Munoz MRN: [id number] Date of Birth: Jan 12, 1928  Transition of Care [facility]) CM/SW [place], RN Phone Number: 08/15/2021, 10:13 AM  Clinical Narrative:   has been in SNF for 4 weeks after nonsurgical hip fracture repair.  The hip has not healed as anticipated and is now going to be surgically repaired today  she now has a stage 2 coccyx pressure injury.  After Surgery to be evaluated by PT and OT, will likely need to go to [facility]         Expected Discharge Plan and Services                                                 Social Determinants of Health (SDOH) Interventions    Readmission Risk Interventions No flowsheet data found.

## 2021-08-15 NOTE — Progress Notes (Signed)
Patient awake, oriented to surroundings, and that procedure complete. Verbalizes understanding. X-ray completed of left hip, noted greater trochanter "cracked"  per Dr. Joice Lofts was not unexpected.  Patient received spinal anesthesia, able to move bil lower ext, sensation intact, able to wiggle toes. Abductor pillow placed per Dr. Joice Lofts

## 2021-08-15 NOTE — Plan of Care (Signed)
°  Problem: Education: °Goal: Knowledge of General Education information will improve °Description: Including pain rating scale, medication(s)/side effects and non-pharmacologic comfort measures °Outcome: Progressing °  °Problem: Health Behavior/Discharge Planning: °Goal: Ability to manage health-related needs will improve °Outcome: Progressing °  °Problem: Clinical Measurements: °Goal: Ability to maintain clinical measurements within normal limits will improve °Outcome: Progressing °Goal: Will remain free from infection °Outcome: Progressing °Goal: Diagnostic test results will improve °Outcome: Progressing °Goal: Respiratory complications will improve °Outcome: Progressing °Goal: Cardiovascular complication will be avoided °Outcome: Progressing °  °Problem: Activity: °Goal: Risk for activity intolerance will decrease °Outcome: Progressing °  °Problem: Nutrition: °Goal: Adequate nutrition will be maintained °Outcome: Progressing °  °Problem: Coping: °Goal: Level of anxiety will decrease °Outcome: Progressing °  °Problem: Elimination: °Goal: Will not experience complications related to bowel motility °Outcome: Progressing °Goal: Will not experience complications related to urinary retention °Outcome: Progressing °  °Problem: Pain Managment: °Goal: General experience of comfort will improve °Outcome: Progressing °  °Problem: Safety: °Goal: Ability to remain free from injury will improve °Outcome: Progressing °  °Problem: Skin Integrity: °Goal: Risk for impaired skin integrity will decrease °Outcome: Progressing °  °Problem: Activity: °Goal: Ability to avoid complications of mobility impairment will improve °Outcome: Progressing °  °Problem: Pain Management: °Goal: Pain level will decrease with appropriate interventions °Outcome: Progressing °  °Problem: Skin Integrity: °Goal: Will show signs of wound healing °Outcome: Progressing °  °

## 2021-08-15 NOTE — NC FL2 (Signed)
[place] LEVEL OF CARE SCREENING TOOL     IDENTIFICATION  Patient Name: Brittany Munoz Birthdate: 09-15-1927 Sex: female Admission Date (Current Location): 08/13/2021  [facility] and [place] Number:  Engineering geologist and Address:  [facility], [address] [place], [place] [address]      Provider Number: [id number]  Attending Physician Name and Address:  Sharen Hones, MD  Relative Name and Phone Number:  Lawerance Sabal [phone]    Current Level of Care: Hospital Recommended Level of Care: [place] Prior Approval Number:    Date Approved/Denied:   PASRR Number: [id number]  Discharge Plan: SNF    Current Diagnoses: Patient Active Problem List   Diagnosis Date Noted   Pressure injury of skin 08/15/2021   Reactive thrombocytosis 08/14/2021   Acetabulum fracture, left ([place]) 08/13/2021   Closed fracture of ilium, unspecified fracture morphology, sequela 07/19/2021   Hypokalemia 07/19/2021   Mild protein malnutrition ([place]) 07/19/2021   Closed left hip fracture ([place]) 06/04/2021   Tachycardia    Chronic respiratory failure with hypoxia ([place])    Stage 3a chronic kidney disease ([place])    Type 2 diabetes mellitus without complication, without long-term current use of insulin (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) 01/10/2015   Normocytic anemia 10/18/2013   End stage COPD ([place]) 10/18/2013   Essential hypertension 10/18/2013   Osteoarthrosis involving lower leg  10/18/2013   Vitamin D deficiency, unspecified 10/18/2013    Orientation RESPIRATION BLADDER Height & Weight     Self, Time, Situation, Place  Normal Continent, External catheter Weight: 68.1 kg Height:  5\' 8"  (172.7 cm)  BEHAVIORAL SYMPTOMS/MOOD NEUROLOGICAL BOWEL NUTRITION STATUS      Continent Diet (Carb Modified)  AMBULATORY STATUS COMMUNICATION OF NEEDS Skin   Extensive Assist Verbally Normal, Surgical wounds                       Personal Care Assistance Level of Assistance  Bathing, Feeding, Dressing Bathing Assistance: Maximum assistance Feeding assistance: Independent Dressing Assistance: Maximum assistance     Functional Limitations Info  Sight, Hearing, Speech Sight Info: Adequate Hearing Info: Adequate Speech Info: Adequate    SPECIAL CARE FACTORS FREQUENCY  PT (By licensed PT), OT (By licensed OT)     PT Frequency: 5 times per week OT Frequency: 5 times per week            Contractures Contractures Info: Not present    Additional Factors Info  Code Status, Allergies, Insulin Sliding Scale Code Status Info: Full code Allergies Info: Aspirin, Cefuroxime Axetil, Moxifloxacin Hcl In Nacl, Oxycodone, Sulfamethoxazole-trimethoprim, Tramadol   Insulin Sliding Scale Info: CBG < 70: Implement Hypoglycemia Standing Orders and refer to Hypoglycemia Standing Orders sidebar report   CBG 70 - 120: 0 units   CBG 121 - 150: 1 unit   CBG 151 - 200: 2 units   CBG 201 - 250: 3 units   CBG 251 - 300: 5 units   CBG 301 - 350: 7 units   CBG 351 -  400 9 units       Current Medications (08/15/2021):  This is the current hospital active medication list Current Facility-Administered Medications  Medication Dose Route Frequency Provider Last Rate Last Admin   0.9 %  sodium chloride infusion   Intravenous Continuous Reubin Milan, MD 10 mL/hr at 08/15/21 0907 New Bag at 08/15/21 0907   acetaminophen (TYLENOL) tablet 650 mg  650 mg Oral Q6H PRN Reubin Milan, MD    650 mg at 08/14/21 1253   Or   acetaminophen (TYLENOL) suppository 650 mg  650 mg Rectal Q6H PRN Reubin Milan, MD       albuterol (PROVENTIL) (2.5 MG/3ML) 0.083% nebulizer solution 2.5 mg  2.5 mg Nebulization Q4H PRN Reubin Milan, MD       amLODipine (NORVASC) tablet 5 mg  5 mg Oral Daily Reubin Milan, MD   5 mg at 08/14/21 0911   budesonide (PULMICORT) nebulizer solution 0.25 mg  0.25 mg Inhalation BID Reubin Milan, MD   0.25 mg at 08/15/21 0744   calcium carbonate (TUMS - dosed in mg elemental calcium) chewable tablet 400 mg of elemental calcium  2 tablet Oral Daily Reubin Milan, MD   400 mg of elemental calcium at 08/14/21 0912   Chlorhexidine Gluconate Cloth 2 % PADS 6 each  6 each Topical Daily Sharen Hones, MD       DULoxetine (CYMBALTA) DR capsule 20 mg  20 mg Oral BID Reubin Milan, MD   20 mg at 08/14/21 2238   enoxaparin (LOVENOX) injection 40 mg  40 mg Subcutaneous Q24H Reubin Milan, MD       ferrous gluconate [facility]) tablet 324 mg  324 mg Oral Glenna Durand, MD   324 mg at 08/14/21 0913   insulin aspart (novoLOG) injection 0-9 Units  0-9 Units Subcutaneous TID WC Reubin Milan, MD       LORazepam (ATIVAN) tablet 2 mg  2 mg Oral BID BM & HS PRN Reubin Milan, MD   2 mg at 08/15/21 0835   metoprolol succinate (TOPROL-XL) 24 hr tablet 25 mg  25 mg Oral Daily Reubin Milan, MD   25 mg at 08/14/21 0912   montelukast (SINGULAIR) tablet 10 mg  10 mg Oral QHS Reubin Milan, MD   10 mg at 08/14/21 2238   ondansetron (ZOFRAN) tablet 4 mg  4 mg Oral Q6H PRN Reubin Milan, MD       Or   ondansetron [facility]) injection 4 mg  4 mg Intravenous Q6H PRN Reubin Milan, MD       oxyCODONE (Oxy IR/ROXICODONE) immediate release tablet 2.5 mg  2.5 mg Oral Q4H PRN Reubin Milan, MD   2.5 mg at 08/13/21 2320   pantoprazole (PROTONIX) EC tablet 40 mg  40 mg Oral Daily Reubin Milan, MD   40 mg at  08/14/21 0912   tiotropium [facility]) inhalation capsule ([facility] use ONLY) 18 mcg  18 mcg Inhalation Daily Reubin Milan, MD       torsemide [facility]) tablet 10 mg  10 mg Oral Daily Reubin Milan, MD   10 mg at 08/14/21 0913   traMADol (ULTRAM) tablet 25 mg  25 mg Oral BID Reubin Milan, MD   25 mg at 08/14/21 2238     Discharge Medications: Please see discharge summary for a list of discharge medications.  Relevant Imaging Results:  Relevant Lab Results:   Additional  Information [id number]  Conception Oms, RN

## 2021-08-15 NOTE — Anesthesia Procedure Notes (Addendum)
Spinal  Patient location during procedure: OR Start time: 08/15/2021 4:30 PM End time: 08/15/2021 4:48 PM Reason for block: surgical anesthesia Staffing Performed: anesthesiologist  Anesthesiologist: Foye Deer, MD Resident/CRNA: Iran Planas, CRNA Preanesthetic Checklist Completed: patient identified, IV checked, site marked, risks and benefits discussed, surgical consent, monitors and equipment checked, pre-op evaluation and timeout performed Spinal Block Patient position: sitting Prep: DuraPrep Patient monitoring: heart rate, cardiac monitor, continuous pulse ox and blood pressure Approach: right paramedian Location: L3-4 Injection technique: single-shot Needle Needle type: Pencan  Needle gauge: 24 G Needle length: 9 cm Assessment Sensory level: T10 Events: CSF return

## 2021-08-15 NOTE — Progress Notes (Signed)
PROGRESS NOTE    [name]  [id number] DOB: [birth year] DOA: 08/13/2021 PCP: Leonel Ramsay, MD    Brief Narrative:   Brittany Munoz is a 88 y.o. female with medical history significant of anxiety, asthma, nonspecified cardiomyopathy, cataracts, COPD, osteoarthritis, depression, type II DM, eczema, gout, hemorrhoids, hyperlipidemia, hypertension, nocturnal hypoxia, osteoporosis, pulmonary fibrosis, seasonal allergies, varicella-zoster who I am familiar with after she went to the emergency department [facility] due to left hip pain X-ray of the hip showed dislocated hip joint with acetabular fracture.  Patient is seen by orthopedics, decision is made for resection arthroplasty, scheduled on 1/12    Assessment & Plan:   Principal Problem:   Acetabulum fracture, left ([place]) Active Problems:   Normocytic anemia   Essential hypertension   Pulmonary fibrosis (HCC)   Tachy-brady syndrome (HCC)   Chronic respiratory failure with hypoxia (HCC)   Stage 3a chronic kidney disease ([place])   Type 2 diabetes mellitus without complication, without long-term current use of insulin (HCC)   Hypokalemia   Reactive thrombocytosis   Pressure injury of skin  Acetabulum fracture, left (HCC) Left hip dislocation. Surgery scheduled for today.  We will monitor patient opt for surgery.  Anemia. Reactive thrombocytosis. No significant iron or B12 deficiency.  Pulmonary fibrosis. Chronic respiratory failure with hypoxemia. Condition stable.  Chronic kidney disease stage IIIa. Hypokalemia. Continue to monitor.  Type 2 diabetes  Continue sliding scale insulin.  Tachybradycardia syndrome. Patient currently on Toprol 25 mg daily.  Continue telemetry.     DVT prophylaxis: Lovenox Code Status: full Family Communication:  Disposition Plan:      Status is: Inpatient   Remains inpatient appropriate because: Pending surgery.  I/O last 3 completed shifts: In: 240  [P.O.:240] Out: 2000 [Urine:2000] No intake/output data recorded.     Consultants:  Orthopedics  Procedures: Pending today  Antimicrobials: None  Subjective: Doing well today, denies any short of breath or cough.  Slept well last night. No abdominal pain or nausea vomiting. No dysuria hematuria P No fever or chills.  Objective: Vitals:   08/14/21 2111 08/15/21 0549 08/15/21 0750 08/15/21 0908  BP: (!) 117/58 123/64 (!) 127/54   Pulse: 83 85 92   Resp: 17 18 18    Temp: 97.6 F (36.4 C) 97.6 F (36.4 C) 98.2 F (36.8 C)   TempSrc:  Tympanic    SpO2: 99% 99% 100%   Weight:    68.1 kg  Height:    5\' 8"  (1.727 m)    Intake/Output Summary (Last 24 hours) at 08/15/2021 1040 Last data filed at 08/15/2021 0500 Gross per 24 hour  Intake 240 ml  Output 2000 ml  Net -1760 ml   Filed Weights   08/15/21 0908  Weight: 68.1 kg    Examination:  General exam: Appears calm and comfortable  Respiratory system: Clear to auscultation. Respiratory effort normal. Cardiovascular system: S1 & S2 heard, RRR. No JVD, murmurs, rubs, gallops or clicks. No pedal edema. Gastrointestinal system: Abdomen is nondistended, soft and nontender. No organomegaly or masses felt. Normal bowel sounds heard. Central nervous system: Alert and oriented. No focal neurological deficits. Extremities: Symmetric 5 x 5 power. Skin: No rashes, lesions or ulcers Psychiatry: Judgement and insight appear normal. Mood & affect appropriate.     Data Reviewed: I have personally reviewed following labs and imaging studies  CBC: Recent Labs  Lab 08/14/21 0507 08/15/21 0604  WBC 6.2 6.4  NEUTROABS  --  3.9  HGB 9.8* 9.8*  HCT 32.0* 31.9*  MCV 78.2* 79.0*  PLT 404* 0000000   Basic Metabolic Panel: Recent Labs  Lab 08/14/21 0507 08/15/21 0604  NA 135 138  K 3.4* 3.8  CL 99 100  CO2 29 32  GLUCOSE 88 102*  BUN 10 13  CREATININE 0.60 0.67  CALCIUM 8.7* 9.0   GFR: Estimated Creatinine Clearance: 46.2  mL/min (by C-G formula based on SCr of 0.67 mg/dL). Liver Function Tests: Recent Labs  Lab 08/14/21 0507  AST 15  ALT 8  ALKPHOS 95  BILITOT 0.6  PROT 5.5*  ALBUMIN 2.6*   No results for input(s): LIPASE, AMYLASE in the last 168 hours. No results for input(s): AMMONIA in the last 168 hours. Coagulation Profile: No results for input(s): INR, PROTIME in the last 168 hours. Cardiac Enzymes: No results for input(s): CKTOTAL, CKMB, CKMBINDEX, TROPONINI in the last 168 hours. BNP (last 3 results) No results for input(s): PROBNP in the last 8760 hours. HbA1C: No results for input(s): HGBA1C in the last 72 hours. CBG: Recent Labs  Lab 08/14/21 0813 08/14/21 1310 08/14/21 1647 08/14/21 2204 08/15/21 0754  GLUCAP 87 109* 135* 132* 100*   Lipid Profile: No results for input(s): CHOL, HDL, LDLCALC, TRIG, CHOLHDL, LDLDIRECT in the last 72 hours. Thyroid Function Tests: No results for input(s): TSH, T4TOTAL, FREET4, T3FREE, THYROIDAB in the last 72 hours. Anemia Panel: Recent Labs    08/13/21 1507 08/14/21 0507  VITAMINB12 574  --   FERRITIN  --  136  TIBC  --  234*  IRON  --  26*   Sepsis Labs: No results for input(s): PROCALCITON, LATICACIDVEN in the last 168 hours.  Recent Results (from the past 240 hour(s))  Resp Panel by RT-PCR (Flu A&B, Covid) Nasopharyngeal Swab     Status: None   Collection Time: 08/13/21  2:17 PM   Specimen: Nasopharyngeal Swab; Nasopharyngeal(NP) swabs in vial transport medium  Result Value Ref Range Status   SARS Coronavirus 2 by RT PCR NEGATIVE NEGATIVE Final    Comment: (NOTE) SARS-CoV-2 target nucleic acids are NOT DETECTED.  The SARS-CoV-2 RNA is generally detectable in upper respiratory specimens during the acute phase of infection. The lowest concentration of SARS-CoV-2 viral copies this assay can detect is 138 copies/mL. A negative result does not preclude SARS-Cov-2 infection and should not be used as the sole basis for treatment  or other patient management decisions. A negative result may occur with  improper specimen collection/handling, submission of specimen other than nasopharyngeal swab, presence of viral mutation(s) within the areas targeted by this assay, and inadequate number of viral copies(<138 copies/mL). A negative result must be combined with clinical observations, patient history, and epidemiological information. The expected result is Negative.  Fact Sheet for Patients:  EntrepreneurPulse.com.au  Fact Sheet for Healthcare Providers:  IncredibleEmployment.be  This test is no t yet approved or cleared by the [place] FDA and  has been authorized for detection and/or diagnosis of SARS-CoV-2 by FDA under an Emergency Use Authorization (EUA). This EUA will remain  in effect (meaning this test can be used) for the duration of the COVID-19 declaration under Section 564(b)(1) of the Act, 21 U.S.C.section 360bbb-3(b)(1), unless the authorization is terminated  or revoked sooner.       Influenza A by PCR NEGATIVE NEGATIVE Final   Influenza B by PCR NEGATIVE NEGATIVE Final    Comment: (NOTE) The Xpert Xpress SARS-CoV-2/FLU/RSV plus assay is intended as an aid in the diagnosis of influenza from Nasopharyngeal swab  specimens and should not be used as a sole basis for treatment. Nasal washings and aspirates are unacceptable for Xpert Xpress SARS-CoV-2/FLU/RSV testing.  Fact Sheet for Patients: EntrepreneurPulse.com.au  Fact Sheet for Healthcare Providers: IncredibleEmployment.be  This test is not yet approved or cleared by the [place] FDA and has been authorized for detection and/or diagnosis of SARS-CoV-2 by FDA under an Emergency Use Authorization (EUA). This EUA will remain in effect (meaning this test can be used) for the duration of the COVID-19 declaration under Section 564(b)(1) of the Act, 21 U.S.C. section  360bbb-3(b)(1), unless the authorization is terminated or revoked.  Performed at [facility], [place] [address], [place], [place] [address]   Surgical PCR screen     Status: None   Collection Time: 08/15/21 12:57 AM   Specimen: Nasal Mucosa; Nasal Swab  Result Value Ref Range Status   MRSA, PCR NEGATIVE NEGATIVE Final   Staphylococcus aureus NEGATIVE NEGATIVE Final    Comment: (NOTE) The Xpert SA Assay (FDA approved for NASAL specimens in patients 69 years of age and older), is one component of a comprehensive surveillance program. It is not intended to diagnose infection nor to guide or monitor treatment. Performed at [facility], [address] [place], [place] [address]          Radiology Studies: DG C-Arm 1-60 Min-No Report  Result Date: 08/13/2021 Fluoroscopy was utilized by the requesting physician.  No radiographic interpretation.   DG HIP PORT UNILAT WITH PELVIS 1V LEFT  Result Date: 08/13/2021 CLINICAL DATA:  Hip reduction EXAM: DG HIP (WITH OR WITHOUT PELVIS) 1V PORT LEFT COMPARISON:  08/13/2021, 06/04/2021 FINDINGS: Two low resolution intraoperative spot views of the left hip. Total fluoroscopy time was 5.2 seconds, fluoroscopy dose 1.26 mGy. The images demonstrate a left hip replacement with complete dislocation of the acetabular components in a superior direction, the acetabular component articulates with the femoral component. There is a displaced left acetabular fracture IMPRESSION: Intraoperative fluoroscopic assistance provided during planned hip reduction which was not performed. There is a dislocated left hip replacement that involves both the acetabular cup and the femoral prosthesis. There is a fracture involving the acetabulum Electronically Signed   By: Donavan Foil M.D.   On: 08/13/2021 17:26        Scheduled Meds:  amLODipine  5 mg Oral Daily   budesonide  0.25 mg Inhalation BID   calcium carbonate  2 tablet Oral Daily    Chlorhexidine Gluconate Cloth  6 each Topical Daily   DULoxetine  20 mg Oral BID   enoxaparin (LOVENOX) injection  40 mg Subcutaneous Q24H   ferrous gluconate  324 mg Oral QODAY   insulin aspart  0-9 Units Subcutaneous TID WC   metoprolol succinate  25 mg Oral Daily   montelukast  10 mg Oral QHS   pantoprazole  40 mg Oral Daily   tiotropium  18 mcg Inhalation Daily   torsemide  10 mg Oral Daily   traMADol  25 mg Oral BID   Continuous Infusions:  sodium chloride 10 mL/hr at 08/15/21 0907     LOS: 2 days    Time spent: 27 minutes    Sharen Hones, MD [facility]   To contact the attending provider between 7A-7P or the covering provider during after hours 7P-7A, please log into the web site [email or web address] and access using universal [place] password for that web site. If you do not have the password, please call the hospital operator.  08/15/2021, 10:40 AM

## 2021-08-15 NOTE — Anesthesia Postprocedure Evaluation (Signed)
Anesthesia Post Note  Patient: Brittany Munoz  Procedure(s) Performed: TOTAL HIP DIS-ARTHROPLASTY (Left: Hip)  Patient location during evaluation: PACU Anesthesia Type: Spinal and Combined General/Spinal Level of consciousness: awake and alert Pain management: pain level controlled Vital Signs Assessment: post-procedure vital signs reviewed and stable Respiratory status: spontaneous breathing, nonlabored ventilation, respiratory function stable and patient connected to nasal cannula oxygen Cardiovascular status: blood pressure returned to baseline and stable Postop Assessment: no apparent nausea or vomiting Anesthetic complications: no   No notable events documented.   Last Vitals:  Vitals:   08/15/21 1945 08/15/21 2009  BP: (!) 131/53 (!) 132/55  Pulse: 83 85  Resp: 15 17  Temp:  36.8 C  SpO2: 100% 98%    Last Pain:  Vitals:   08/15/21 2009  TempSrc: Oral  PainSc:                  Foye Deer

## 2021-08-15 NOTE — Op Note (Signed)
08/13/2021 - 08/15/2021  6:42 PM  Patient:   Brittany Munoz  Pre-Op Diagnosis:   Irreducible left prosthetic hip dislocation with comminuted displaced acetabular fracture.  Post-Op Diagnosis:   Same  Procedure:   Resection arthroplasty left hip.  Surgeon:   Maryagnes Amos, MD  Assistant:   Horris Latino, PA-C  Anesthesia:   Spinal  Findings:   As above.  Complications:   None  Fluids:   750 cc crystalloid  EBL:   50 cc  UOP:   250 cc  TT:   None  Drains:   None  Closure:   Staples  Brief Clinical Note:   The patient is a 88 year old female who underwent a cemented bipolar hemiarthroplasty 2.5 months ago for a displaced left femoral neck fracture.  Initially, the patient did reasonably well.  However, because of worsening left hip pain, she presented to emergency room where x-rays demonstrated a nondisplaced acetabular fracture.  An attempt was made to manage the fracture nonsurgically, so the patient was returned to the rehab facility.  Several days ago, the patient was noted to have progressive deformity of her left hip.  An x-ray at the rehab facility suggested a posteriorly dislocated hip.  The patient was brought to [facility].  However, radiographic imaging demonstrated that the medial wall acetabular fracture had now fully displaced, resulting in pelvic discontinuity.  In addition, the posterior acetabular wall also had a displaced fracture, preventing adequate stability for the hip.  After discussing the options with the patient, her daughter-in-law who is her power of attorney, and several other orthopedic surgery experts, the decision was made to proceed with a resection arthroplasty to allow the patient to sit more comfortably and to assist with perineal hygiene.  Procedure:   The patient was brought into the operating room.  After adequate spinal anesthesia was obtained, the patient was lain in the right lateral decubitus position on the fracture table and secured using the  lateral hip positioner.  An axillary roll was placed and care was taken to be sure the right lateral knee and lateral ankle region were well-padded.  The left hip and lower extremity was prepped with a ChloraPrep solution before being draped sterilely.  Preoperative antibiotics were administered.    Utilizing the proximal two thirds of the previous incision and extending it proximally for another 4-5 cm, an approximately 6 inch incision was made over the lateral aspect of the hip.  The incision was carried down through the subcutaneous tissues to expose the iliotibial band and gluteal fascia.  The structures were split the length of the incision to visualize the dislocated bipolar prosthesis.  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  The bipolar head was removed using a bone tamp before the proximal end of the femur was debrided of soft tissues to better visualize the prosthetic cement interface in the proximal femur.  Utilizing the [employer] precontoured prosthesis chisels and the flexible osteotomes, an attempt was made to disrupt the cement prosthesis bond.  The universal femoral distractor device was attached to the trunnion, then the SLAP hammer attachment was applied to this distractor.  Multiple repeated slaps were performed, pausing several times to pass the osteotomes down the sides of the prosthesis before repeating the slaphammer maneuver.  The prosthesis finally released and was extracted along with a moderate amount of some cement.  At this time, it is noted that the greater  trochanter had cracked which was not unexpected.  The wound was copiously irrigated with sterile saline solution using the jet lavage system before the soft tissues were injected utilizing a solution of 20 cc of Exparel, 30 cc of 0.5% Sensorcaine with epinephrine, and 10 cc of normal saline.  A gram of vancomycin powder was sprinkled around  the wound to minimize the risk of postoperative infection before the iliotibial band and gluteal fascia was reapproximated using #1 Vicryl interrupted sutures.  The subcutaneous tissues were closed in 2 layers using 2-0 Vicryl interrupted sutures before the skin was closed using staples.  A sterile occlusive dressing was applied to the wound before the patient was transferred back to her hospital bed and returned to the recovery room in satisfactory condition after tolerating the procedure well.

## 2021-08-15 NOTE — Consult Note (Signed)
[place] Nurse Consult Note: Reason for Consult:Patient has been in SNF for 4 weeks after nonsurgical hip fracture repair.  The hip has not healed as anticipated and is now going to be surgically repaired.  She is admitted to [facility] with stage 2 coccyx pressure injury.  Wound type:stage 2 pressure Pressure Injury POA: Yes Measurement: 2 cm x 1.5 cm x 0.2 cm  Wound [id number] and moist Drainage (amount, consistency, odor) none Periwound:intact Dressing procedure/placement/frequency: Cleanse coccyx wound with NS and pat dry. Apply small piece of alginate ([place] # [id number]) to open area.  COver with sacral silicone foam dressing.  Change every other day.  Will not follow at this time.  Please re-consult if needed.  Domenic Moras MSN, RN, FNP-BC CWON Wound, Ostomy, Continence Nurse Pager [phone]

## 2021-08-16 DIAGNOSIS — S32402A Unspecified fracture of left acetabulum, initial encounter for closed fracture: Secondary | ICD-10-CM | POA: Diagnosis not present

## 2021-08-16 DIAGNOSIS — J9611 Chronic respiratory failure with hypoxia: Secondary | ICD-10-CM | POA: Diagnosis not present

## 2021-08-16 LAB — CBC WITH DIFFERENTIAL/PLATELET
Abs Immature Granulocytes: 0.08 10*3/uL — ABNORMAL HIGH (ref 0.00–0.07)
Basophils Absolute: 0.1 10*3/uL (ref 0.0–0.1)
Basophils Relative: 1 %
Eosinophils Absolute: 0.1 10*3/uL (ref 0.0–0.5)
Eosinophils Relative: 1 %
HCT: 32 % — ABNORMAL LOW (ref 36.0–46.0)
Hemoglobin: 9.5 g/dL — ABNORMAL LOW (ref 12.0–15.0)
Immature Granulocytes: 1 %
Lymphocytes Relative: 11 %
Lymphs Abs: 0.9 10*3/uL (ref 0.7–4.0)
MCH: 24 pg — ABNORMAL LOW (ref 26.0–34.0)
MCHC: 29.7 g/dL — ABNORMAL LOW (ref 30.0–36.0)
MCV: 80.8 fL (ref 80.0–100.0)
Monocytes Absolute: 0.7 10*3/uL (ref 0.1–1.0)
Monocytes Relative: 8 %
Neutro Abs: 6.3 10*3/uL (ref 1.7–7.7)
Neutrophils Relative %: 78 %
Platelets: 321 10*3/uL (ref 150–400)
RBC: 3.96 MIL/uL (ref 3.87–5.11)
RDW: 16.8 % — ABNORMAL HIGH (ref 11.5–15.5)
WBC: 8.1 10*3/uL (ref 4.0–10.5)
nRBC: 0 % (ref 0.0–0.2)

## 2021-08-16 LAB — BASIC METABOLIC PANEL
Anion gap: 12 (ref 5–15)
BUN: 9 mg/dL (ref 8–23)
CO2: 26 mmol/L (ref 22–32)
Calcium: 8.6 mg/dL — ABNORMAL LOW (ref 8.9–10.3)
Chloride: 101 mmol/L (ref 98–111)
Creatinine, Ser: 0.54 mg/dL (ref 0.44–1.00)
GFR, Estimated: >60 mL/min (ref >60)
Glucose, Bld: 100 mg/dL — ABNORMAL HIGH (ref 70–99)
Potassium: 4 mmol/L (ref 3.5–5.1)
Sodium: 139 mmol/L (ref 135–145)

## 2021-08-16 LAB — GLUCOSE, CAPILLARY
Glucose-Capillary: 128 mg/dL — ABNORMAL HIGH (ref 70–99)
Glucose-Capillary: 126 mg/dL — ABNORMAL HIGH (ref 70–99)
Glucose-Capillary: 137 mg/dL — ABNORMAL HIGH (ref 70–99)

## 2021-08-16 LAB — MAGNESIUM: Magnesium: 1.5 mg/dL — ABNORMAL LOW (ref 1.7–2.4)

## 2021-08-16 MED ORDER — OXYCODONE HCL 5 MG PO TABS
5.0000 mg | ORAL_TABLET | Freq: Four times a day (QID) | ORAL | Status: DC | PRN
  Administered 2021-08-17 – 2021-08-19 (×5): 5 mg via ORAL
  Filled 2021-08-16 (×5): qty 1

## 2021-08-16 MED ORDER — IPRATROPIUM-ALBUTEROL 0.5-2.5 (3) MG/3ML IN SOLN
3.0000 mL | Freq: Four times a day (QID) | RESPIRATORY_TRACT | Status: DC
  Administered 2021-08-16 – 2021-08-18 (×10): 3 mL via RESPIRATORY_TRACT
  Filled 2021-08-16 (×11): qty 3

## 2021-08-16 MED ORDER — MAGNESIUM SULFATE 4 GM/100ML IV SOLN
4.0000 g | Freq: Once | INTRAVENOUS | Status: AC
  Administered 2021-08-16: 4 g via INTRAVENOUS
  Filled 2021-08-16: qty 100

## 2021-08-16 MED ORDER — MORPHINE SULFATE (PF) 2 MG/ML IV SOLN
2.0000 mg | Freq: Once | INTRAVENOUS | Status: AC
  Administered 2021-08-16: 2 mg via INTRAVENOUS
  Filled 2021-08-16: qty 1

## 2021-08-16 MED ORDER — ENOXAPARIN SODIUM 40 MG/0.4ML IJ SOSY
40.0000 mg | PREFILLED_SYRINGE | INTRAMUSCULAR | 0 refills | Status: AC
Start: 2021-08-16 — End: ?

## 2021-08-16 NOTE — Progress Notes (Signed)
PROGRESS NOTE    Brittany Munoz  [id number] DOB: [birth year] DOA: 08/13/2021 PCP: Leonel Ramsay, MD   Chief complaint.  Left hip pain. Brief Narrative:  Brittany Munoz is a 88 y.o. female with medical history significant of anxiety, asthma, nonspecified cardiomyopathy, cataracts, COPD, osteoarthritis, depression, type II DM, eczema, gout, hemorrhoids, hyperlipidemia, hypertension, nocturnal hypoxia, osteoporosis, pulmonary fibrosis, seasonal allergies, varicella-zoster who I am familiar with after she went to the emergency department [facility] due to left hip pain X-ray of the hip showed dislocated hip joint with acetabular fracture.  Patient is seen by orthopedics,  resection arthroplasty of the left hip was performed on 1/12.  Assessment & Plan:   Principal Problem:   Acetabulum fracture, left (HCC) Active Problems:   Normocytic anemia   Essential hypertension   Pulmonary fibrosis (HCC)   Tachy-brady syndrome (HCC)   Chronic respiratory failure with hypoxia (HCC)   Stage 3a chronic kidney disease (HCC)   Type 2 diabetes mellitus without complication, without long-term current use of insulin (HCC)   Hypokalemia   Reactive thrombocytosis   Pressure injury of skin   Hypomagnesemia  Acetabulum fracture, left (HCC) Left hip dislocation. Patient is status post resection arthroplasty of left hip.  Currently still have significant pain, will continue pain medicine.  Will discontinue fluids once patient has adequate p.o. intake.  Anemia. Reactive thrombocytosis. Continue to follow.  Check CBC tomorrow.  Pulmonary fibrosis. Chronic respiratory failure with hypoxemia. Patient has some cough and bronchospasm today, added Combivent.  Chronic kidney disease stage IIIa ruled out Hypokalemia. Hypomagnesemia. Based on lab test, patient does not appear to have chronic kidney disease stage IIIa.   Potassium normalized, will replete magnesium.   Type 2 diabetes   Continue sliding scale insulin.  Tachybradycardia syndrome. Patient currently on Toprol 25 mg daily.  Continue telemetry.     DVT prophylaxis: Lovenox Code Status: full Family Communication: Daughter-in-law, POA updated. Disposition Plan: Patient came from nursing home, she probably can return to nursing home by Saturday or Sunday.     Status is: Inpatient   Remains inpatient appropriate because: Severity of disease, postop pain control.        I/O last 3 completed shifts: In: 2117.4 [I.V.:1817.4; IV Piggyback:300] Out: 450 [Urine:400; Blood:50] No intake/output data recorded.     Consultants:  orthopedic  Procedures: hip  Antimicrobials: hip  Subjective: Patient still complaining of severe pain in the left hip, taking pain medicine.  She has some wheezing, started on Combivent.  No cough. Denies any fever chills pain No abdominal pain nausea vomiting. No dysuria hematuria.  Objective: Vitals:   08/15/21 2230 08/16/21 0108 08/16/21 0558 08/16/21 0735  BP: 132/74 134/70 (!) 145/49   Pulse: 98 (!) 110 (!) 108   Resp: 17 17 20    Temp:   98.8 F (37.1 C)   TempSrc:      SpO2: 98% 97% 97% 96%  Weight:      Height:        Intake/Output Summary (Last 24 hours) at 08/16/2021 1002 Last data filed at 08/16/2021 0511 Gross per 24 hour  Intake 2117.37 ml  Output 150 ml  Net 1967.37 ml   Filed Weights   08/15/21 0908 08/15/21 1511  Weight: 68.1 kg 68.1 kg    Examination:  General exam: Appears calm and comfortable  Respiratory system: Clear to auscultation. Respiratory effort normal. Cardiovascular system: S1 & S2 heard, RRR. No JVD, murmurs, rubs, gallops or clicks. No pedal edema. Gastrointestinal  system: Abdomen is nondistended, soft and nontender. No organomegaly or masses felt. Normal bowel sounds heard. Central nervous system: Alert and oriented x2. No focal neurological deficits. Extremities: Symmetric 5 x 5 power. Skin: No rashes, lesions or  ulcers Psychiatry: Judgement and insight appear normal. Mood & affect appropriate.     Data Reviewed: I have personally reviewed following labs and imaging studies  CBC: Recent Labs  Lab 08/14/21 0507 08/15/21 0604 08/16/21 0531  WBC 6.2 6.4 8.1  NEUTROABS  --  3.9 6.3  HGB 9.8* 9.8* 9.5*  HCT 32.0* 31.9* 32.0*  MCV 78.2* 79.0* 80.8  PLT 404* 384 [id number]   Basic Metabolic Panel: Recent Labs  Lab 08/14/21 0507 08/15/21 0604 08/16/21 0531  NA 135 138 139  K 3.4* 3.8 4.0  CL 99 100 101  CO2 29 32 26  GLUCOSE 88 102* 100*  BUN 10 13 9   CREATININE 0.60 0.67 0.54  CALCIUM 8.7* 9.0 8.6*  MG  --   --  1.5*   GFR: Estimated Creatinine Clearance: 46.2 mL/min (by C-G formula based on SCr of 0.54 mg/dL). Liver Function Tests: Recent Labs  Lab 08/14/21 0507  AST 15  ALT 8  ALKPHOS 95  BILITOT 0.6  PROT 5.5*  ALBUMIN 2.6*   No results for input(s): LIPASE, AMYLASE in the last 168 hours. No results for input(s): AMMONIA in the last 168 hours. Coagulation Profile: No results for input(s): INR, PROTIME in the last 168 hours. Cardiac Enzymes: No results for input(s): CKTOTAL, CKMB, CKMBINDEX, TROPONINI in the last 168 hours. BNP (last 3 results) No results for input(s): PROBNP in the last 8760 hours. HbA1C: No results for input(s): HGBA1C in the last 72 hours. CBG: Recent Labs  Lab 08/15/21 0754 08/15/21 1129 08/15/21 1855 08/15/21 2233 08/16/21 0910  GLUCAP 100* 87 96 109* 128*   Lipid Profile: No results for input(s): CHOL, HDL, LDLCALC, TRIG, CHOLHDL, LDLDIRECT in the last 72 hours. Thyroid Function Tests: No results for input(s): TSH, T4TOTAL, FREET4, T3FREE, THYROIDAB in the last 72 hours. Anemia Panel: Recent Labs    08/13/21 1507 08/14/21 0507  VITAMINB12 574  --   FERRITIN  --  136  TIBC  --  234*  IRON  --  26*   Sepsis Labs: No results for input(s): PROCALCITON, LATICACIDVEN in the last 168 hours.  Recent Results (from the past 240 hour(s))   Resp Panel by RT-PCR (Flu A&B, Covid) Nasopharyngeal Swab     Status: None   Collection Time: 08/13/21  2:17 PM   Specimen: Nasopharyngeal Swab; Nasopharyngeal(NP) swabs in vial transport medium  Result Value Ref Range Status   SARS Coronavirus 2 by RT PCR NEGATIVE NEGATIVE Final    Comment: (NOTE) SARS-CoV-2 target nucleic acids are NOT DETECTED.  The SARS-CoV-2 RNA is generally detectable in upper respiratory specimens during the acute phase of infection. The lowest concentration of SARS-CoV-2 viral copies this assay can detect is 138 copies/mL. A negative result does not preclude SARS-Cov-2 infection and should not be used as the sole basis for treatment or other patient management decisions. A negative result may occur with  improper specimen collection/handling, submission of specimen other than nasopharyngeal swab, presence of viral mutation(s) within the areas targeted by this assay, and inadequate number of viral copies(<138 copies/mL). A negative result must be combined with clinical observations, patient history, and epidemiological information. The expected result is Negative.  Fact Sheet for Patients:  EntrepreneurPulse.com.au  Fact Sheet for Healthcare Providers:  IncredibleEmployment.be  This  test is no t yet approved or cleared by the [place] and  has been authorized for detection and/or diagnosis of SARS-CoV-2 by FDA under an Emergency Use Authorization (EUA). This EUA will remain  in effect (meaning this test can be used) for the duration of the COVID-19 declaration under Section 564(b)(1) of the Act, 21 U.S.C.section 360bbb-3(b)(1), unless the authorization is terminated  or revoked sooner.       Influenza A by PCR NEGATIVE NEGATIVE Final   Influenza B by PCR NEGATIVE NEGATIVE Final    Comment: (NOTE) The Xpert Xpress SARS-CoV-2/FLU/RSV plus assay is intended as an aid in the diagnosis of influenza from  Nasopharyngeal swab specimens and should not be used as a sole basis for treatment. Nasal washings and aspirates are unacceptable for Xpert Xpress SARS-CoV-2/FLU/RSV testing.  Fact Sheet for Patients: EntrepreneurPulse.com.au  Fact Sheet for Healthcare Providers: IncredibleEmployment.be  This test is not yet approved or cleared by the [place] FDA and has been authorized for detection and/or diagnosis of SARS-CoV-2 by FDA under an Emergency Use Authorization (EUA). This EUA will remain in effect (meaning this test can be used) for the duration of the COVID-19 declaration under Section 564(b)(1) of the Act, 21 U.S.C. section 360bbb-3(b)(1), unless the authorization is terminated or revoked.  Performed at [facility], [place] [address], [place], [place] [address]   Surgical PCR screen     Status: None   Collection Time: 08/15/21 12:57 AM   Specimen: Nasal Mucosa; Nasal Swab  Result Value Ref Range Status   MRSA, PCR NEGATIVE NEGATIVE Final   Staphylococcus aureus NEGATIVE NEGATIVE Final    Comment: (NOTE) The Xpert SA Assay (FDA approved for NASAL specimens in patients 72 years of age and older), is one component of a comprehensive surveillance program. It is not intended to diagnose infection nor to guide or monitor treatment. Performed at [facility], [address] [place], [place] [address]   Aerobic/Anaerobic Culture w Gram Stain (surgical/deep wound)     Status: None (Preliminary result)   Collection Time: 08/15/21  4:19 PM   Specimen: PATH Other; Tissue  Result Value Ref Range Status   Specimen Description   Final    HIP LEFT Performed at [facility], [address] [place], [place] [address]    Special Requests   Final    NONE Performed at [facility] [address] [place] [address] [place], [place] [address]    Gram Stain   Final    NO SQUAMOUS EPITHELIAL CELLS SEEN FEW WBC SEEN NO  ORGANISMS SEEN Performed at [facility], [place] [address], [place], [place] [address]    Culture PENDING  Incomplete   Report Status PENDING  Incomplete         Radiology Studies: DG HIP UNILAT W OR W/O PELVIS 2-3 VIEWS LEFT  Result Date: 08/15/2021 CLINICAL DATA:  Postop. EXAM: DG HIP (WITH OR WITHOUT PELVIS) 2-3V LEFT COMPARISON:  Hip radiograph 07/19/2021 FINDINGS: Previous left hip hemiarthroplasty has been explanted. There is a fractures of the central left acetabulum. There is also fracture through the greater trochanter and proximal lateral femoral shaft. Soft tissue edema and lateral skin staples. Probable overlying ice pack on the lateral view, without definite intra-articular antibiotic beads on AP. IMPRESSION: Left hip hemiarthroplasty explanted. Fractures of the central acetabulum, greater trochanter/proximal lateral trochanteric femur. Electronically Signed   By: Keith Rake M.D.   On: 08/15/2021 19:43  Scheduled Meds:  acetaminophen  500 mg Oral Q6H   amLODipine  5 mg Oral Daily   budesonide  0.25 mg Inhalation BID   calcium carbonate  2 tablet Oral Daily   Chlorhexidine Gluconate Cloth  6 each Topical Daily   docusate sodium  100 mg Oral BID   DULoxetine  20 mg Oral BID   enoxaparin (LOVENOX) injection  40 mg Subcutaneous Q24H   ferrous gluconate  324 mg Oral QODAY   insulin aspart  0-9 Units Subcutaneous TID WC   ipratropium-albuterol  3 mL Inhalation Q6H   metoprolol succinate  25 mg Oral Daily   montelukast  10 mg Oral QHS   pantoprazole  40 mg Oral Daily   tiotropium  18 mcg Inhalation Daily   torsemide  10 mg Oral Daily   traMADol  25 mg Oral BID   Continuous Infusions:  sodium chloride 75 mL/hr at 08/16/21 0511    ceFAZolin (ANCEF) IV 2 g (08/16/21 0555)   magnesium sulfate bolus IVPB       LOS: 3 days    Time spent: 25 minutes    Sharen Hones, MD [facility]   To contact the attending provider between 7A-7P or the  covering provider during after hours 7P-7A, please log into the web site [email or web address] and access using universal [place] password for that web site. If you do not have the password, please call the hospital operator.  08/16/2021, 10:02 AM

## 2021-08-16 NOTE — Plan of Care (Signed)
°  Problem: Education: Goal: Knowledge of General Education information will improve Description: Including pain rating scale, medication(s)/side effects and non-pharmacologic comfort measures Outcome: Progressing   Problem: Health Behavior/Discharge Planning: Goal: Ability to manage health-related needs will improve Outcome: Progressing   Problem: Clinical Measurements: Goal: Ability to maintain clinical measurements within normal limits will improve Outcome: Progressing Goal: Will remain free from infection Outcome: Progressing Goal: Diagnostic test results will improve Outcome: Progressing Goal: Respiratory complications will improve Outcome: Progressing Goal: Cardiovascular complication will be avoided Outcome: Progressing   Problem: Activity: Goal: Risk for activity intolerance will decrease Outcome: Progressing   Problem: Nutrition: Goal: Adequate nutrition will be maintained Outcome: Progressing   Problem: Coping: Goal: Level of anxiety will decrease Outcome: Progressing   Problem: Elimination: Goal: Will not experience complications related to bowel motility Outcome: Progressing Goal: Will not experience complications related to urinary retention Outcome: Progressing   Problem: Pain Managment: Goal: General experience of comfort will improve Outcome: Progressing   Problem: Safety: Goal: Ability to remain free from injury will improve Outcome: Progressing   Problem: Skin Integrity: Goal: Risk for impaired skin integrity will decrease Outcome: Progressing   Problem: Activity: Goal: Ability to avoid complications of mobility impairment will improve Outcome: Progressing   Problem: Pain Management: Goal: Pain level will decrease with appropriate interventions Outcome: Progressing   Problem: Skin Integrity: Goal: Will show signs of wound healing Outcome: Progressing

## 2021-08-16 NOTE — Care Management Important Message (Signed)
Important Message  Patient Details  Name: Brittany Munoz MRN: [id number] Date of Birth: [birth year]   Medicare Important Message Given:  Yes     Dannette Barbara 08/16/2021, 11:48 AM

## 2021-08-16 NOTE — Evaluation (Signed)
Physical Therapy Evaluation Patient Details Name: Brittany Munoz MRN: [id number] DOB: 11-11-1927 Today's Date: 08/16/2021  History of Present Illness  Brittany Munoz is a 19yoF who comes [facility]10 c CC Left hip pain. Pt is 10weeks s/p cemented Left bipolar hemiarthroplasty to correct Lt femoral neck fracture. PMH: COPD on O2, pulmonary fibrosis, HTN, DM, osteoporosis, gout, depression, GAD, cardiomyopathy, resides at [facility], RW AMB at baseline. CT scan shows displaced Left hip fracture. Pt went to OR c Dr. Milagros Evener for Lt hip hemiarthroplasty using "standard posterior approach". Per Dr. 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 Pt is WBAT LLE. films demonstrate a posterior dislocation of the left hip prosthesis. Pt was taken to OR for closed reduction under fluroscopy, however, findings of acetabular wall fracture led to aborting procedure. Pt went to OR next day for resection of irreducible Left HHA. Pt is WBAT LLE.  Clinical Impression  Pt admitted c above Dx. Pt shows functional limitations due to the deficits listed below (see "PT Problem List"). Patient agreeable to PT evaluation. PLOF and home setup obtained. Pt familiar to author from prior admission. Pt posturing to comfort in bed slightly reclined, LLE flexed and internally rotated. Attempts to reposition passively are met with severe limiting pain. Pt tolerates HOB adjustment fairly well, but is not agreeable to perform any additional bed mobility at this time, given her severe pain with NSG mobility earlier. Unfortunately pt was very limited postoperatively here 10 weeks prior after HHA, and per family reports, had not progressed back to significant amounts of OOB actiivty at [facility]. Given present LLE disarticulation of femur from pelvis, it is not clear what will result from any prior hip/knee moments that previously created axial compression of hip joint, as now the resultant forces would likely  create cranial approximation of the proximal osteotomed femur toward the acetabular vacancy- in truth osteokinematics of this situation cannot be predicted with precision. This includes basic sitting at EOB which requires hip extensors for stabilization would not function in a functional or predictable capacity. High levels of pain and guarding at this time do not allow for further assessment of tolerance for pivot transfers, however mechnical lift transfer is likely a safer option at this time. Given pt's most recent progression in mobility now made more complicated but additional injury, pt not appropriate for PT at this time. Author would advise OOB mobility as directed by orthopedics on a need basis at this time with potential to reevaluate in coming months as healing of pelvis progresses.       Recommendations for follow up therapy are one component of a multi-disciplinary discharge planning process, led by the attending physician.  Recommendations may be updated based on patient status, additional functional criteria and insurance authorization.  Follow Up Recommendations Long-term institutional care without follow-up therapy    Assistance Recommended at Discharge Set up Supervision/Assistance  Patient can return home with the following       Equipment Recommendations    Recommendations for Other Services       Functional Status Assessment Patient has had a recent decline in their functional status and/or demonstrates limited ability to make significant improvements in function in a reasonable and predictable amount of time     Precautions / Restrictions Precautions Precautions: Fall Restrictions LLE Weight Bearing: Weight bearing as tolerated      Mobility  Bed Mobility  General bed mobility comments: pt tolerating bed changes, but not allowing any pssive movement of LLE.    Transfers                        Ambulation/Gait                   Stairs            Wheelchair Mobility    Modified Rankin (Stroke Patients Only)       Balance                                             Pertinent Vitals/Pain Pain Assessment: Faces Faces Pain Scale: Hurts whole lot (any attempt to move from her self selected posturing in bed.) Pain Location: Left knee/hip Pain Intervention(s): Limited activity within patient's tolerance;Repositioned    Home Living Family/patient expects to be discharged to:: Skilled nursing facility                   Additional Comments: Pt living at [facility] prior to fall 10 weeks ago.    Prior Function               Mobility Comments: Pt has not been able to advance mobility back to AMB since being at facility.       Hand Dominance        Extremity/Trunk Assessment   Upper Extremity Assessment Upper Extremity Assessment: Generalized weakness    Lower Extremity Assessment Lower Extremity Assessment: Difficult to assess due to impaired cognition (pain limiting)       Communication   Communication: HOH  Cognition Arousal/Alertness: Awake/alert Behavior During Therapy: WFL for tasks assessed/performed Overall Cognitive Status: Within Functional Limits for tasks assessed                                          General Comments      Exercises     Assessment/Plan    PT Assessment Patient does not need any further PT services  PT Problem List         PT Treatment Interventions      PT Goals (Current goals can be found in the Care Plan section)  Acute Rehab PT Goals PT Goal Formulation: All assessment and education complete, DC therapy    Frequency       Co-evaluation               AM-PAC PT "6 Clicks" Mobility  Outcome Measure Help needed turning from your back to your side while in a flat bed without using bedrails?: Total Help needed moving from lying on your back to sitting on the side of a flat bed  without using bedrails?: Total Help needed moving to and from a bed to a chair (including a wheelchair)?: Total Help needed standing up from a chair using your arms (e.g., wheelchair or bedside chair)?: Total Help needed to walk in hospital room?: Total Help needed climbing 3-5 steps with a railing? : Total 6 Click Score: 6    End of Session Equipment Utilized During Treatment: Oxygen Activity Tolerance: Patient limited by pain Patient left: in bed;with call bell/phone within reach Nurse Communication: Mobility status PT Visit Diagnosis: Other abnormalities  of gait and mobility (R26.89);Difficulty in walking, not elsewhere classified (R26.2)    Time: 4650-3546 PT Time Calculation (min) (ACUTE ONLY): 13 min   Charges:   PT Evaluation $PT Eval Moderate Complexity: 1 Mod        3:31 PM, 08/16/21 Etta Grandchild, PT, DPT Physical Therapist - [facility]  [phone] ([place])   Naziah Portee C 08/16/2021, 3:11 PM

## 2021-08-16 NOTE — Progress Notes (Signed)
°   08/16/21 1000  Clinical Encounter Type  Visited With Patient  Visit Type Initial;Spiritual support  Referral From Patient  Spiritual Encounters  Spiritual Needs Prayer   Patient requested pre-operation prayer. Chaplain facilitated this request and discussion. Patient kept asking about her scooter and where it is and asked about some of the tubes coming out of her. Her communication and questions would seem to indicate some confusion.

## 2021-08-16 NOTE — Progress Notes (Signed)
°  Subjective: 1 Day Post-Op Procedure(s) (LRB): TOTAL HIP DIS-ARTHROPLASTY (Left) Patient reports pain as moderate.  States that she feels miserable this morning. Patient is demented, asking if we are doing surgery today. Plan is to go Skilled nursing facility after hospital stay. Negative for chest pain and shortness of breath Fever: no Gastrointestinal:Negative for nausea and vomiting  Objective: Vital signs in last 24 hours: Temp:  [97.4 F (36.3 C)-98.8 F (37.1 C)] 98.8 F (37.1 C) (01/13 0558) Pulse Rate:  [72-110] 108 (01/13 0558) Resp:  [15-20] 20 (01/13 0558) BP: (112-148)/(48-76) 145/49 (01/13 0558) SpO2:  [95 %-100 %] 96 % (01/13 0735) Weight:  [68.1 kg] 68.1 kg (01/12 1511)  Intake/Output from previous day:  Intake/Output Summary (Last 24 hours) at 08/16/2021 0749 Last data filed at 08/16/2021 0511 Gross per 24 hour  Intake 2117.37 ml  Output 150 ml  Net 1967.37 ml    Intake/Output this shift: No intake/output data recorded.  Labs: Recent Labs    08/14/21 0507 08/15/21 0604 08/16/21 0531  HGB 9.8* 9.8* 9.5*   Recent Labs    08/15/21 0604 08/16/21 0531  WBC 6.4 8.1  RBC 4.04 3.96  HCT 31.9* 32.0*  PLT 384 321   Recent Labs    08/15/21 0604 08/16/21 0531  NA 138 139  K 3.8 4.0  CL 100 101  CO2 32 26  BUN 13 9  CREATININE 0.67 0.54  GLUCOSE 102* 100*  CALCIUM 9.0 8.6*   No results for input(s): LABPT, INR in the last 72 hours.  EXAM General - Patient is Disorganized and Confused Extremity - ABD soft Neurovascular intact Incision: moderate drainage No cellulitis present Moderate flexion contracture noted to the left knee. Dressing/Incision - moderate bloody drainage noted to the left hip, honeycomb dressing changed today. Motor Function - intact, moving foot and toes well on exam.  Abdomen soft with normal bowel sounds.  Past Medical History:  Diagnosis Date   Anxiety    Asthma    Cardiomyopathy ([place])    Cataract    COPD  (chronic obstructive pulmonary disease) (HCC)    Degenerative arthritis    Depression    Diabetes ([place])    Eczema    Gout    Hemorrhoids    Hyperlipidemia    Hypertension    Nocturnal hypoxia    Osteoporosis    Pulmonary fibrosis (HCC)    Seasonal allergies    Shingles     Assessment/Plan: 1 Day Post-Op Procedure(s) (LRB): TOTAL HIP DIS-ARTHROPLASTY (Left) Principal Problem:   Acetabulum fracture, left (HCC) Active Problems:   Normocytic anemia   Essential hypertension   Pulmonary fibrosis (HCC)   Tachy-brady syndrome (HCC)   Chronic respiratory failure with hypoxia (HCC)   Stage 3a chronic kidney disease (HCC)   Type 2 diabetes mellitus without complication, without long-term current use of insulin (HCC)   Hypokalemia   Reactive thrombocytosis   Pressure injury of skin  Estimated body mass index is 22.83 kg/m as calculated from the following:   Height as of this encounter: 5\' 8"  (1.727 m).   Weight as of this encounter: 68.1 kg.  Labs reviewed this AM. Hg 9.5 this morning. Moderate flexion contracture to the left hip.   Up with therapy as tolerated. Hip abduction pillow was loosened at today's visit Discharge back to rehab when stable.  DVT Prophylaxis - Lovenox and TED hose Weight-Bearing as tolerated to left leg  J. Cameron Proud, PA-C [facility] 08/16/2021, 7:49 AM

## 2021-08-16 NOTE — TOC Progression Note (Signed)
Transition of Care [facility]) - Progression Note    Patient Details  Name: Brittany Munoz MRN: [id number] Date of Birth: 21-Oct-1927  Transition of Care [facility]) CM/SW [place], RN Phone Number: 08/16/2021, 2:41 PM  Clinical Narrative:   Patient had surgery, will need to work with PT to have recommendation, TOC to follow and assist with needs         Expected Discharge Plan and Services                                                 Social Determinants of Health (SDOH) Interventions    Readmission Risk Interventions No flowsheet data found.

## 2021-08-17 DIAGNOSIS — J9611 Chronic respiratory failure with hypoxia: Secondary | ICD-10-CM | POA: Diagnosis not present

## 2021-08-17 DIAGNOSIS — S32402A Unspecified fracture of left acetabulum, initial encounter for closed fracture: Secondary | ICD-10-CM | POA: Diagnosis not present

## 2021-08-17 DIAGNOSIS — E876 Hypokalemia: Secondary | ICD-10-CM | POA: Diagnosis not present

## 2021-08-17 LAB — TYPE AND SCREEN
ABO/RH(D): O POS
Antibody Screen: NEGATIVE
Unit division: 0
Unit division: 0

## 2021-08-17 LAB — BPAM RBC
Blood Product Expiration Date: 202302092359
Blood Product Expiration Date: 202302092359
Unit Type and Rh: 5100
Unit Type and Rh: 5100

## 2021-08-17 LAB — FERRITIN: Ferritin: 263 ng/mL (ref 11–307)

## 2021-08-17 LAB — CBC WITH DIFFERENTIAL/PLATELET
Abs Immature Granulocytes: 0.07 10*3/uL (ref 0.00–0.07)
Basophils Absolute: 0 10*3/uL (ref 0.0–0.1)
Basophils Relative: 1 %
Eosinophils Absolute: 0.1 10*3/uL (ref 0.0–0.5)
Eosinophils Relative: 1 %
HCT: 25.8 % — ABNORMAL LOW (ref 36.0–46.0)
Hemoglobin: 7.9 g/dL — ABNORMAL LOW (ref 12.0–15.0)
Immature Granulocytes: 1 %
Lymphocytes Relative: 15 %
Lymphs Abs: 1.2 10*3/uL (ref 0.7–4.0)
MCH: 24 pg — ABNORMAL LOW (ref 26.0–34.0)
MCHC: 30.6 g/dL (ref 30.0–36.0)
MCV: 78.4 fL — ABNORMAL LOW (ref 80.0–100.0)
Monocytes Absolute: 1 10*3/uL (ref 0.1–1.0)
Monocytes Relative: 12 %
Neutro Abs: 5.7 10*3/uL (ref 1.7–7.7)
Neutrophils Relative %: 70 %
Platelets: 262 10*3/uL (ref 150–400)
RBC: 3.29 MIL/uL — ABNORMAL LOW (ref 3.87–5.11)
RDW: 16.5 % — ABNORMAL HIGH (ref 11.5–15.5)
WBC: 8 10*3/uL (ref 4.0–10.5)
nRBC: 0 % (ref 0.0–0.2)

## 2021-08-17 LAB — VITAMIN B12: Vitamin B-12: 516 pg/mL (ref 180–914)

## 2021-08-17 LAB — IRON AND TIBC
Iron: 10 ug/dL — ABNORMAL LOW (ref 28–170)
Saturation Ratios: 5 % — ABNORMAL LOW (ref 10.4–31.8)
TIBC: 185 ug/dL — ABNORMAL LOW (ref 250–450)
UIBC: 175 ug/dL

## 2021-08-17 LAB — GLUCOSE, CAPILLARY
Glucose-Capillary: 97 mg/dL (ref 70–99)
Glucose-Capillary: 110 mg/dL — ABNORMAL HIGH (ref 70–99)
Glucose-Capillary: 121 mg/dL — ABNORMAL HIGH (ref 70–99)
Glucose-Capillary: 107 mg/dL — ABNORMAL HIGH (ref 70–99)
Glucose-Capillary: 122 mg/dL — ABNORMAL HIGH (ref 70–99)

## 2021-08-17 NOTE — Evaluation (Signed)
Occupational Therapy Evaluation Patient Details Name: Brittany Munoz MRN: [id number] DOB: 27-Jan-1928 Today's Date: 08/17/2021   History of Present Illness Pt is a 88 y/o F with PMH: COPD on home O2, pulmonary fibrosis, HTN, DM, osteoporosis, gout, depression, GAD, and CMY. Pt was living at [facility] initially and was amb with RW at baseline. She was s/p left hip bipolar hemiarthroplasty on 06/04/21 w/ Dr. Joice Lofts. She d/c'ed to [facility] on 06/07/21 for rehab. She presented to [facility] ED on 12/16 w/ c/o L hip pain and at that time conservative mgt was recommended and f/u imaging outpatient. She re-presented to [facility] ED again on 08/13/21 for L hip dislocation. on 1/10  pt s/p Aborted attempted at closed reduction of posterior left prosthetic hip dislocation. Pt finally s/p Resection arthroplasty left hip on 1/12 d/t "Irreducible left prosthetic hip dislocation with comminuted displaced acetabular fracture".   Clinical Impression   Pt seen for OT evaluation this date in setting of acute hospitalization d/t several L hip complications and procedures. She presents this date with decreased fxl activity tolerance for any/all mobilization 2/2 pain including even bed mobility. She does, with breathing relaxation techniques integrated, finally tolerate very minimal AAROM of internal/external rotation of L LE x2 reps requiring extended time. OT engages pt in repositioning to propel towards [facility] with MAX/TOTAL A in trendelenburg. Rolling attempted, but will likely require 2p assist. Pt not amenable to trying further mobility at this time citing pain. OT engages pt in below listed exercise of R LE and B UE with increased time and visual/tactile cues for form/pace/technique. Pt states she wants to keep trying to get stronger even though she has severe pain in her L LE. OT will pick up on a trial basis for 2-3 sessions to assess for rehab potential, but pt could potentially be more LTC appropriate at this point given such  limited mobility tolerance. At this time, recommending STR f/u OT services.      Recommendations for follow up therapy are one component of a multi-disciplinary discharge planning process, led by the attending physician.  Recommendations may be updated based on patient status, additional functional criteria and insurance authorization.   Follow Up Recommendations  Skilled nursing-short term rehab (<3 hours/day)    Assistance Recommended at Discharge Intermittent Supervision/Assistance  Patient can return home with the following Two people to help with walking and/or transfers;Two people to help with bathing/dressing/bathroom    Functional Status Assessment  Patient has had a recent decline in their functional status and demonstrates the ability to make significant improvements in function in a reasonable and predictable amount of time.  Equipment Recommendations  Other (comment) (defer to next level of care)    Recommendations for Other Services       Precautions / Restrictions Precautions Precautions: Fall Restrictions Weight Bearing Restrictions: Yes LLE Weight Bearing: Non weight bearing      Mobility Bed Mobility Overal bed mobility: Needs Assistance Bed Mobility: Rolling Rolling: Total assist;+2 for physical assistance         General bed mobility comments: grimmacing/moaning with any/all attempts to roll.    Transfers                   General transfer comment: not appropriate to attempt at this time.      Balance  ADL either performed or assessed with clinical judgement   ADL                                         General ADL Comments: able to perform all bed level UB ADLs with SETUP, requires MAX/TOTAL A for LB ADLs (one person assist for donning socks, 2p assist for peri care/toileting/bathing tasks). Unable to assess mobility functionally, she really does not tolerate  assisting in bed level repositioning either d/t pain.     Vision Patient Visual Report: No change from baseline       Perception     Praxis      Pertinent Vitals/Pain Pain Assessment: Faces Faces Pain Scale: Hurts whole lot Pain Location: L LE with severe pain with ANY attempts to reposition/mobilize/engage the LE in any level of activity, even just bed level repositioning. Pain Descriptors / Indicators: Grimacing;Guarding;Crying;Moaning Pain Intervention(s): Limited activity within patient's tolerance;Repositioned     Hand Dominance Right   Extremity/Trunk Assessment Upper Extremity Assessment Upper Extremity Assessment: Generalized weakness (ROM WFL, MMT grossly 4-/5)   Lower Extremity Assessment Lower Extremity Assessment: RLE deficits/detail;LLE deficits/detail RLE Deficits / Details: limited strength/ROM including limited ability to complete SLR from bed level. DF/PF grossly 3+/5 LLE Deficits / Details: essentailly unable to assess on any level. Pt does attempt to particpate in very light ROM to bring hip from externally rotated resting position in bed, attempted to internally rotate to neutral. Unable to really assess any other movement. Pt cannot tolerate attempting to extend L LE. LLE: Unable to fully assess due to pain       Communication Communication Communication: HOH   Cognition Arousal/Alertness: Awake/alert Behavior During Therapy: WFL for tasks assessed/performed Overall Cognitive Status: Within Functional Limits for tasks assessed                                 General Comments: somewhat HOH so some diffiuclty with cues, but overall able to particpate appropriately, follows commands well, pleasant     General Comments       Exercises Other Exercises Other Exercises: OT engages pt in bed level UB ADLs including straight arm raises x10 per side, tricep pushes (bed level modification) x10, bed level int/ext rotation of R LE while knee is in  flexion with AAROM d/t limited tolerance.   Shoulder Instructions      Home Living Family/patient expects to be discharged to:: Skilled nursing facility                                 Additional Comments: [facility] living [facility] prior to fall 10 weeks ago.      Prior Functioning/Environment Prior Level of Function : Independent/Modified Independent;Other (comment)             Mobility Comments: Pt has not been able to advance mobility back to AMB since being at facility. ADLs Comments: Per chart, pt able to complete ADLs w/ assist device ~ 3 weeks ago. Unclear ADL ability, previous 3 weeks.        OT Problem List: Decreased strength;Decreased range of motion;Decreased activity tolerance;Impaired balance (sitting and/or standing);Pain;Increased edema      OT Treatment/Interventions: Self-care/ADL training;Therapeutic exercise;DME and/or AE instruction;Therapeutic activities;Patient/family education;Balance training  OT Goals(Current goals can be found in the care plan section) Acute Rehab OT Goals Patient Stated Goal: to try to participate in therapy the best I can. OT Goal Formulation: With patient Time For Goal Achievement: 08/31/21 Potential to Achieve Goals: Good  OT Frequency: Min 2X/week    Co-evaluation              AM-PAC OT "6 Clicks" Daily Activity     Outcome Measure Help from another person eating meals?: None Help from another person taking care of personal grooming?: A Little Help from another person toileting, which includes using toliet, bedpan, or urinal?: Total Help from another person bathing (including washing, rinsing, drying)?: Total Help from another person to put on and taking off regular upper body clothing?: A Little Help from another person to put on and taking off regular lower body clothing?: Total 6 Click Score: 13   End of Session Nurse Communication: Mobility status  Activity Tolerance: Patient limited by  pain Patient left: in bed;with call bell/phone within reach;with bed alarm set  OT Visit Diagnosis: Unsteadiness on feet (R26.81);Muscle weakness (generalized) (M62.81)                Time: 5366-4403 OT Time Calculation (min): 36 min Charges:  OT General Charges $OT Visit: 1 Visit OT Evaluation $OT Eval Moderate Complexity: 1 Mod OT Treatments $Self Care/Home Management : 8-22 mins $Therapeutic Exercise: 8-22 mins  Rejeana Brock, MS, OTR/L ascom [phone] 08/17/21, 6:08 PM

## 2021-08-17 NOTE — Plan of Care (Signed)
°  Problem: Education: °Goal: Knowledge of General Education information will improve °Description: Including pain rating scale, medication(s)/side effects and non-pharmacologic comfort measures °Outcome: Progressing °  °Problem: Health Behavior/Discharge Planning: °Goal: Ability to manage health-related needs will improve °Outcome: Progressing °  °Problem: Clinical Measurements: °Goal: Ability to maintain clinical measurements within normal limits will improve °Outcome: Progressing °Goal: Will remain free from infection °Outcome: Progressing °Goal: Diagnostic test results will improve °Outcome: Progressing °Goal: Respiratory complications will improve °Outcome: Progressing °Goal: Cardiovascular complication will be avoided °Outcome: Progressing °  °Problem: Activity: °Goal: Risk for activity intolerance will decrease °Outcome: Progressing °  °Problem: Nutrition: °Goal: Adequate nutrition will be maintained °Outcome: Progressing °  °Problem: Coping: °Goal: Level of anxiety will decrease °Outcome: Progressing °  °Problem: Elimination: °Goal: Will not experience complications related to bowel motility °Outcome: Progressing °Goal: Will not experience complications related to urinary retention °Outcome: Progressing °  °Problem: Pain Managment: °Goal: General experience of comfort will improve °Outcome: Progressing °  °Problem: Safety: °Goal: Ability to remain free from injury will improve °Outcome: Progressing °  °Problem: Skin Integrity: °Goal: Risk for impaired skin integrity will decrease °Outcome: Progressing °  °Problem: Activity: °Goal: Ability to avoid complications of mobility impairment will improve °Outcome: Progressing °  °Problem: Pain Management: °Goal: Pain level will decrease with appropriate interventions °Outcome: Progressing °  °Problem: Skin Integrity: °Goal: Will show signs of wound healing °Outcome: Progressing °  °

## 2021-08-17 NOTE — Progress Notes (Signed)
°  Subjective: 2 Days Post-Op Procedure(s) (LRB): TOTAL HIP DIS-ARTHROPLASTY (Left) Patient pleasant but confused. No complaints of pain at rest.   Objective: Vital signs in last 24 hours: Temp:  [97.4 F (36.3 C)-98.6 F (37 C)] 97.9 F (36.6 C) (01/14 1122) Pulse Rate:  [92-106] 92 (01/14 1122) Resp:  [15-20] 16 (01/14 1122) BP: (106-141)/(48-91) 118/91 (01/14 1122) SpO2:  [93 %-99 %] 99 % (01/14 1122)  Intake/Output from previous day:  Intake/Output Summary (Last 24 hours) at 08/17/2021 1130 Last data filed at 08/17/2021 1045 Gross per 24 hour  Intake 600 ml  Output 775 ml  Net -175 ml    Intake/Output this shift: Total I/O In: 240 [P.O.:240] Out: -   Labs: Recent Labs    08/15/21 0604 08/16/21 0531 08/17/21 0458  HGB 9.8* 9.5* 7.9*   Recent Labs    08/16/21 0531 08/17/21 0458  WBC 8.1 8.0  RBC 3.96 3.29*  HCT 32.0* 25.8*  PLT 321 262   Recent Labs    08/15/21 0604 08/16/21 0531  NA 138 139  K 3.8 4.0  CL 100 101  CO2 32 26  BUN 13 9  CREATININE 0.67 0.54  GLUCOSE 102* 100*  CALCIUM 9.0 8.6*   No results for input(s): LABPT, INR in the last 72 hours.   EXAM General - Patient is Alert and Confused Extremity - Neurovascular intact Dorsiflexion/Plantar flexion intact Compartment soft Dressing/Incision -clean, dry, no drainage Motor Function - intact, moving foot and toes well on exam.  Assessment/Plan: 2 Days Post-Op Procedure(s) (LRB): TOTAL HIP DIS-ARTHROPLASTY (Left) Principal Problem:   Acetabulum fracture, left (HCC) Active Problems:   Normocytic anemia   Essential hypertension   Pulmonary fibrosis (HCC)   Tachy-brady syndrome (HCC)   Chronic respiratory failure with hypoxia (HCC)   Stage 3a chronic kidney disease (HCC)   Type 2 diabetes mellitus without complication, without long-term current use of insulin (HCC)   Hypokalemia   Reactive thrombocytosis   Pressure injury of skin   Hypomagnesemia  Estimated body mass index is  22.83 kg/m as calculated from the following:   Height as of this encounter: 5\' 8"  (1.727 m).   Weight as of this encounter: 68.1 kg.  Will hold off on therapy at this time.  Awaiting long term  placement at this time.     DVT Prophylaxis - Lovenox, Ted hose, and SCDs NonWeight-Bearing as tolerated to left leg  Cassell Smiles, PA-C [facility]opa[facility] Surgery 08/17/2021, 11:30 AM

## 2021-08-17 NOTE — Progress Notes (Signed)
PROGRESS NOTE    Brittany Munoz  [id number] DOB: 17-Apr-1928 DOA: 08/13/2021 PCP: Leonel Ramsay, MD   Chief complaint.  Left hip pain. Brief Narrative:  Brittany Munoz is a 88 y.o. female with medical history significant of anxiety, asthma, nonspecified cardiomyopathy, cataracts, COPD, osteoarthritis, depression, type II DM, eczema, gout, hemorrhoids, hyperlipidemia, hypertension, nocturnal hypoxia, osteoporosis, pulmonary fibrosis, seasonal allergies, varicella-zoster who I am familiar with after she went to the emergency department [facility] due to left hip pain X-ray of the hip showed dislocated hip joint with acetabular fracture.  Patient is seen by orthopedics,  resection arthroplasty of the left hip was performed on 1/12.   Assessment & Plan:   Principal Problem:   Acetabulum fracture, left (HCC) Active Problems:   Normocytic anemia   Essential hypertension   Pulmonary fibrosis (HCC)   Tachy-brady syndrome (HCC)   Chronic respiratory failure with hypoxia (HCC)   Stage 3a chronic kidney disease (HCC)   Type 2 diabetes mellitus without complication, without long-term current use of insulin (HCC)   Hypokalemia   Reactive thrombocytosis   Pressure injury of skin   Hypomagnesemia  Acetabulum fracture, left (HCC) Left hip dislocation. Patient doing well, still significant pain in the left hip.  Continue oxycodone 5 mg as needed for pain control. Patient had adequate p.o. intake, fluids discontinued.  Anemia. Reactive thrombocytosis. Check iron B12 level.  Pulmonary fibrosis. Chronic respiratory failure with hypoxemia. No bronchospasm today.  Chronic kidney disease stage IIIa ruled out Hypokalemia. Hypomagnesemia Hypophosphatemia. Replete potassium and phosphate.  She also received 4 g of magnesium yesterday, recheck levels tomorrow.    Type 2 diabetes  Continue sliding scale insulin.  Tachybradycardia syndrome. Patient currently on Toprol  25 mg daily.  Continue telemetry.     DVT prophylaxis: Lovenox Code Status: full Family Communication:  Disposition Plan: Patient came from ALF, will need nursing placement     Status is: Inpatient   Remains inpatient appropriate because: Severity of disease, postop pain control.    I/O last 3 completed shifts: In: 1667.4 [P.O.:600; I.V.:967.4; IV Piggyback:100] Out: 875 [Urine:875] No intake/output data recorded.     Consultants:  Orthopedics.  Procedures: Hip surgery.  Antimicrobials: None  Subjective: Patient still complaining of left hip pain, notified nurse to give hydrocodone. Denies any short of breath or cough, still on 2 L oxygen which is baseline. No fever chills  No dysuria hematuria. No abdominal pain or nausea vomiting.  Objective: Vitals:   08/16/21 1940 08/17/21 0023 08/17/21 0508 08/17/21 0746  BP: (!) 141/74 107/62 (!) 118/49 (!) 106/48  Pulse: 95 98 92 95  Resp: 19 20 19 15   Temp: 98.6 F (37 C) 98 F (36.7 C) (!) 97.5 F (36.4 C) (!) 97.4 F (36.3 C)  TempSrc: Oral Oral Oral   SpO2: 93% 96% 97% 98%  Weight:      Height:        Intake/Output Summary (Last 24 hours) at 08/17/2021 1031 Last data filed at 08/17/2021 0500 Gross per 24 hour  Intake 360 ml  Output 775 ml  Net -415 ml   Filed Weights   08/15/21 0908 08/15/21 1511  Weight: 68.1 kg 68.1 kg    Examination:  General exam: Appears calm and comfortable  Respiratory system: Clear to auscultation. Respiratory effort normal. Cardiovascular system: S1 & S2 heard, RRR. No JVD, murmurs, rubs, gallops or clicks. No pedal edema. Gastrointestinal system: Abdomen is nondistended, soft and nontender. No organomegaly or masses felt. Normal  bowel sounds heard. Central nervous system: Alert and oriented x2. No focal neurological deficits. Extremities: Symmetric 5 x 5 power. Skin: No rashes, lesions or ulcers Psychiatry: Judgement and insight appear normal. Mood & affect appropriate.      Data Reviewed: I have personally reviewed following labs and imaging studies  CBC: Recent Labs  Lab 08/14/21 0507 08/15/21 0604 08/16/21 0531 08/17/21 0458  WBC 6.2 6.4 8.1 8.0  NEUTROABS  --  3.9 6.3 5.7  HGB 9.8* 9.8* 9.5* 7.9*  HCT 32.0* 31.9* 32.0* 25.8*  MCV 78.2* 79.0* 80.8 78.4*  PLT 404* 384 321 [id number]   Basic Metabolic Panel: Recent Labs  Lab 08/14/21 0507 08/15/21 0604 08/16/21 0531  NA 135 138 139  K 3.4* 3.8 4.0  CL 99 100 101  CO2 29 32 26  GLUCOSE 88 102* 100*  BUN 10 13 9   CREATININE 0.60 0.67 0.54  CALCIUM 8.7* 9.0 8.6*  MG  --   --  1.5*   GFR: Estimated Creatinine Clearance: 46.2 mL/min (by C-G formula based on SCr of 0.54 mg/dL). Liver Function Tests: Recent Labs  Lab 08/14/21 0507  AST 15  ALT 8  ALKPHOS 95  BILITOT 0.6  PROT 5.5*  ALBUMIN 2.6*   No results for input(s): LIPASE, AMYLASE in the last 168 hours. No results for input(s): AMMONIA in the last 168 hours. Coagulation Profile: No results for input(s): INR, PROTIME in the last 168 hours. Cardiac Enzymes: No results for input(s): CKTOTAL, CKMB, CKMBINDEX, TROPONINI in the last 168 hours. BNP (last 3 results) No results for input(s): PROBNP in the last 8760 hours. HbA1C: No results for input(s): HGBA1C in the last 72 hours. CBG: Recent Labs  Lab 08/15/21 2233 08/16/21 0910 08/16/21 1223 08/16/21 2119 08/17/21 0748  GLUCAP 109* 128* 126* 137* 97   Lipid Profile: No results for input(s): CHOL, HDL, LDLCALC, TRIG, CHOLHDL, LDLDIRECT in the last 72 hours. Thyroid Function Tests: No results for input(s): TSH, T4TOTAL, FREET4, T3FREE, THYROIDAB in the last 72 hours. Anemia Panel: No results for input(s): VITAMINB12, FOLATE, FERRITIN, TIBC, IRON, RETICCTPCT in the last 72 hours. Sepsis Labs: No results for input(s): PROCALCITON, LATICACIDVEN in the last 168 hours.  Recent Results (from the past 240 hour(s))  Resp Panel by RT-PCR (Flu A&B, Covid) Nasopharyngeal Swab      Status: None   Collection Time: 08/13/21  2:17 PM   Specimen: Nasopharyngeal Swab; Nasopharyngeal(NP) swabs in vial transport medium  Result Value Ref Range Status   SARS Coronavirus 2 by RT PCR NEGATIVE NEGATIVE Final    Comment: (NOTE) SARS-CoV-2 target nucleic acids are NOT DETECTED.  The SARS-CoV-2 RNA is generally detectable in upper respiratory specimens during the acute phase of infection. The lowest concentration of SARS-CoV-2 viral copies this assay can detect is 138 copies/mL. A negative result does not preclude SARS-Cov-2 infection and should not be used as the sole basis for treatment or other patient management decisions. A negative result may occur with  improper specimen collection/handling, submission of specimen other than nasopharyngeal swab, presence of viral mutation(s) within the areas targeted by this assay, and inadequate number of viral copies(<138 copies/mL). A negative result must be combined with clinical observations, patient history, and epidemiological information. The expected result is Negative.  Fact Sheet for Patients:  EntrepreneurPulse.com.au  Fact Sheet for Healthcare Providers:  IncredibleEmployment.be  This test is no t yet approved or cleared by the [place] FDA and  has been authorized for detection and/or diagnosis of SARS-CoV-2 by  FDA under an Emergency Use Authorization (EUA). This EUA will remain  in effect (meaning this test can be used) for the duration of the COVID-19 declaration under Section 564(b)(1) of the Act, 21 U.S.C.section 360bbb-3(b)(1), unless the authorization is terminated  or revoked sooner.       Influenza A by PCR NEGATIVE NEGATIVE Final   Influenza B by PCR NEGATIVE NEGATIVE Final    Comment: (NOTE) The Xpert Xpress SARS-CoV-2/FLU/RSV plus assay is intended as an aid in the diagnosis of influenza from Nasopharyngeal swab specimens and should not be used as a sole basis  for treatment. Nasal washings and aspirates are unacceptable for Xpert Xpress SARS-CoV-2/FLU/RSV testing.  Fact Sheet for Patients: EntrepreneurPulse.com.au  Fact Sheet for Healthcare Providers: IncredibleEmployment.be  This test is not yet approved or cleared by the [place] FDA and has been authorized for detection and/or diagnosis of SARS-CoV-2 by FDA under an Emergency Use Authorization (EUA). This EUA will remain in effect (meaning this test can be used) for the duration of the COVID-19 declaration under Section 564(b)(1) of the Act, 21 U.S.C. section 360bbb-3(b)(1), unless the authorization is terminated or revoked.  Performed at [facility], [place] [address], [place], [place] [address]   Surgical PCR screen     Status: None   Collection Time: 08/15/21 12:57 AM   Specimen: Nasal Mucosa; Nasal Swab  Result Value Ref Range Status   MRSA, PCR NEGATIVE NEGATIVE Final   Staphylococcus aureus NEGATIVE NEGATIVE Final    Comment: (NOTE) The Xpert SA Assay (FDA approved for NASAL specimens in patients 48 years of age and older), is one component of a comprehensive surveillance program. It is not intended to diagnose infection nor to guide or monitor treatment. Performed at [facility], [address] [place], [place] [address]   Aerobic/Anaerobic Culture w Gram Stain (surgical/deep wound)     Status: None (Preliminary result)   Collection Time: 08/15/21  4:19 PM   Specimen: PATH Other; Tissue  Result Value Ref Range Status   Specimen Description   Final    HIP LEFT Performed at [facility], [address] [place], [place] [address]    Special Requests   Final    NONE Performed at [facility], [address] [place], [place] [address]    Gram Stain   Final    NO SQUAMOUS EPITHELIAL CELLS SEEN FEW WBC SEEN NO ORGANISMS SEEN Performed at [facility], [place] [address]  [place],  [address]    Culture PENDING  Incomplete   Report Status PENDING  Incomplete         Radiology Studies: DG HIP UNILAT W OR W/O PELVIS 2-3 VIEWS LEFT  Result Date: 08/15/2021 CLINICAL DATA:  Postop. EXAM: DG HIP (WITH OR WITHOUT PELVIS) 2-3V LEFT COMPARISON:  Hip radiograph 07/19/2021 FINDINGS: Previous left hip hemiarthroplasty has been explanted. There is a fractures of the central left acetabulum. There is also fracture through the greater trochanter and proximal lateral femoral shaft. Soft tissue edema and lateral skin staples. Probable overlying ice pack on the lateral view, without definite intra-articular antibiotic beads on AP. IMPRESSION: Left hip hemiarthroplasty explanted. Fractures of the central acetabulum, greater trochanter/proximal lateral trochanteric femur. Electronically Signed   By: Keith Rake M.D.   On: 08/15/2021 19:43        Scheduled Meds:  amLODipine  5 mg Oral Daily   budesonide  0.25 mg Inhalation BID   calcium carbonate  2 tablet Oral  Daily   Chlorhexidine Gluconate Cloth  6 each Topical Daily   docusate sodium  100 mg Oral BID   DULoxetine  20 mg Oral BID   enoxaparin (LOVENOX) injection  40 mg Subcutaneous Q24H   ferrous gluconate  324 mg Oral QODAY   insulin aspart  0-9 Units Subcutaneous TID WC   ipratropium-albuterol  3 mL Inhalation Q6H   metoprolol succinate  25 mg Oral Daily   montelukast  10 mg Oral QHS   pantoprazole  40 mg Oral Daily   tiotropium  18 mcg Inhalation Daily   torsemide  10 mg Oral Daily   traMADol  25 mg Oral BID   Continuous Infusions:   LOS: 4 days    Time spent: 24 minutes    Sharen Hones, MD [facility]   To contact the attending provider between 7A-7P or the covering provider during after hours 7P-7A, please log into the web site [email or web address] and access using universal [place] password for that web site. If you do not have the password, please call the hospital  operator.  08/17/2021, 10:31 AM

## 2021-08-17 NOTE — Progress Notes (Signed)
PT Cancellation Note  Patient Details Name: Brittany Munoz MRN: [id number] DOB: January 16, 1928   Cancelled Treatment:    Reason Eval/Treat Not Completed: Other (comment) New PT orders received stating pt is NWB LLE although most recent ortho PA note still states WBAT. Spoke with attending MD re: new consult placed after PT signed off yesterday with attending in agreement that new consult is not necessary. PT to complete current orders.  Lavone Nian, PT, DPT 08/17/21, 8:01 AM   [name] 08/17/2021, 8:00 AM

## 2021-08-18 DIAGNOSIS — S32402A Unspecified fracture of left acetabulum, initial encounter for closed fracture: Secondary | ICD-10-CM | POA: Diagnosis not present

## 2021-08-18 DIAGNOSIS — J9611 Chronic respiratory failure with hypoxia: Secondary | ICD-10-CM | POA: Diagnosis not present

## 2021-08-18 DIAGNOSIS — E876 Hypokalemia: Secondary | ICD-10-CM | POA: Diagnosis not present

## 2021-08-18 LAB — CBC WITH DIFFERENTIAL/PLATELET
Abs Immature Granulocytes: 0.06 10*3/uL (ref 0.00–0.07)
Basophils Absolute: 0.1 10*3/uL (ref 0.0–0.1)
Basophils Relative: 1 %
Eosinophils Absolute: 0.3 10*3/uL (ref 0.0–0.5)
Eosinophils Relative: 4 %
HCT: 22.9 % — ABNORMAL LOW (ref 36.0–46.0)
Hemoglobin: 7.3 g/dL — ABNORMAL LOW (ref 12.0–15.0)
Immature Granulocytes: 1 %
Lymphocytes Relative: 16 %
Lymphs Abs: 1.3 10*3/uL (ref 0.7–4.0)
MCH: 24.4 pg — ABNORMAL LOW (ref 26.0–34.0)
MCHC: 31.9 g/dL (ref 30.0–36.0)
MCV: 76.6 fL — ABNORMAL LOW (ref 80.0–100.0)
Monocytes Absolute: 1.1 10*3/uL — ABNORMAL HIGH (ref 0.1–1.0)
Monocytes Relative: 14 %
Neutro Abs: 5.1 10*3/uL (ref 1.7–7.7)
Neutrophils Relative %: 64 %
Platelets: 253 10*3/uL (ref 150–400)
RBC: 2.99 MIL/uL — ABNORMAL LOW (ref 3.87–5.11)
RDW: 16.4 % — ABNORMAL HIGH (ref 11.5–15.5)
WBC: 8 10*3/uL (ref 4.0–10.5)
nRBC: 0 % (ref 0.0–0.2)

## 2021-08-18 LAB — BASIC METABOLIC PANEL
Anion gap: 9 (ref 5–15)
BUN: 14 mg/dL (ref 8–23)
CO2: 28 mmol/L (ref 22–32)
Calcium: 8.4 mg/dL — ABNORMAL LOW (ref 8.9–10.3)
Chloride: 94 mmol/L — ABNORMAL LOW (ref 98–111)
Creatinine, Ser: 0.81 mg/dL (ref 0.44–1.00)
GFR, Estimated: >60 mL/min (ref >60)
Glucose, Bld: 93 mg/dL (ref 70–99)
Potassium: 3.6 mmol/L (ref 3.5–5.1)
Sodium: 131 mmol/L — ABNORMAL LOW (ref 135–145)

## 2021-08-18 LAB — GLUCOSE, CAPILLARY
Glucose-Capillary: 112 mg/dL — ABNORMAL HIGH (ref 70–99)
Glucose-Capillary: 102 mg/dL — ABNORMAL HIGH (ref 70–99)
Glucose-Capillary: 115 mg/dL — ABNORMAL HIGH (ref 70–99)
Glucose-Capillary: 107 mg/dL — ABNORMAL HIGH (ref 70–99)

## 2021-08-18 LAB — MAGNESIUM: Magnesium: 2.1 mg/dL (ref 1.7–2.4)

## 2021-08-18 LAB — RESP PANEL BY RT-PCR (FLU A&B, COVID) ARPGX2
Influenza A by PCR: NEGATIVE
Influenza B by PCR: NEGATIVE
SARS Coronavirus 2 by RT PCR: NEGATIVE

## 2021-08-18 LAB — PHOSPHORUS: Phosphorus: 3.4 mg/dL (ref 2.5–4.6)

## 2021-08-18 MED ORDER — IPRATROPIUM-ALBUTEROL 0.5-2.5 (3) MG/3ML IN SOLN
3.0000 mL | Freq: Three times a day (TID) | RESPIRATORY_TRACT | Status: DC
  Administered 2021-08-19 (×2): 3 mL via RESPIRATORY_TRACT
  Filled 2021-08-18 (×2): qty 3

## 2021-08-18 MED ORDER — IRON DEXTRAN 50 MG/ML IJ SOLN: 250.0000 mg | Freq: Once | INTRAMUSCULAR | Status: DC

## 2021-08-18 MED ORDER — SODIUM CHLORIDE 0.9 % IV SOLN
250.0000 mg | Freq: Once | INTRAVENOUS | Status: AC
  Administered 2021-08-18: 250 mg via INTRAVENOUS
  Filled 2021-08-18: qty 20

## 2021-08-18 MED ORDER — HYDROMORPHONE HCL 1 MG/ML IJ SOLN
0.5000 mg | Freq: Once | INTRAMUSCULAR | Status: AC
  Administered 2021-08-18: 0.5 mg via INTRAVENOUS
  Filled 2021-08-18: qty 1

## 2021-08-18 MED ORDER — SODIUM CHLORIDE 0.9 % IV SOLN: INTRAVENOUS | Status: AC

## 2021-08-18 MED ORDER — POLYETHYLENE GLYCOL 3350 17 G PO PACK
17.0000 g | PACK | Freq: Every day | ORAL | Status: DC
  Administered 2021-08-18: 17 g via ORAL
  Filled 2021-08-18: qty 1

## 2021-08-18 NOTE — Progress Notes (Signed)
°  Subjective: 3 Days Post-Op Procedure(s) (LRB): TOTAL HIP DIS-ARTHROPLASTY (Left) Patient reports minimal pain at rest, but significant pain felt with movement of the left hip.  Has not had bowel movement since surgery.   Objective: Vital signs in last 24 hours: Temp:  [97.5 F (36.4 C)-98.7 F (37.1 C)] 98 F (36.7 C) (01/15 0916) Pulse Rate:  [90-104] 104 (01/15 0916) Resp:  [15-18] 15 (01/15 0916) BP: (98-118)/(42-91) 107/42 (01/15 0916) SpO2:  [90 %-99 %] 98 % (01/15 0916)  Intake/Output from previous day:  Intake/Output Summary (Last 24 hours) at 08/18/2021 1007 Last data filed at 08/18/2021 0500 Gross per 24 hour  Intake 480 ml  Output 600 ml  Net -120 ml    Intake/Output this shift: No intake/output data recorded.  Labs: Recent Labs    08/16/21 0531 08/17/21 0458 08/18/21 0434  HGB 9.5* 7.9* 7.3*   Recent Labs    08/17/21 0458 08/18/21 0434  WBC 8.0 8.0  RBC 3.29* 2.99*  HCT 25.8* 22.9*  PLT 262 253   Recent Labs    08/16/21 0531 08/18/21 0434  NA 139 131*  K 4.0 3.6  CL 101 94*  CO2 26 28  BUN 9 14  CREATININE 0.54 0.81  GLUCOSE 100* 93  CALCIUM 8.6* 8.4*   No results for input(s): LABPT, INR in the last 72 hours.   EXAM General - Patient is  alert and oriented x4 today  Extremity - Neurovascular intact Dorsiflexion/Plantar flexion intact Compartment soft Dressing/Incision -clean, dry, no drainage Motor Function - intact, moving foot and toes well on exam.     Assessment/Plan: 3 Days Post-Op Procedure(s) (LRB): TOTAL HIP DIS-ARTHROPLASTY (Left) Principal Problem:   Acetabulum fracture, left (HCC) Active Problems:   Normocytic anemia   Essential hypertension   Pulmonary fibrosis (HCC)   Tachy-brady syndrome (HCC)   Chronic respiratory failure with hypoxia (HCC)   Stage 3a chronic kidney disease (HCC)   Type 2 diabetes mellitus without complication, without long-term current use of insulin (HCC)   Hypokalemia   Reactive  thrombocytosis   Pressure injury of skin   Hypomagnesemia  Estimated body mass index is 22.83 kg/m as calculated from the following:   Height as of this encounter: 5\' 8"  (1.727 m).   Weight as of this encounter: 68.1 kg. Advance diet  Continue occupational therapy at this time.     DVT Prophylaxis - Lovenox, Ted hose, and SCDs NonWeight-Bearing to left leg  Cassell Smiles, PA-C [facility] 08/18/2021, 10:07 AM

## 2021-08-18 NOTE — Plan of Care (Signed)
°  Problem: Education: °Goal: Knowledge of General Education information will improve °Description: Including pain rating scale, medication(s)/side effects and non-pharmacologic comfort measures °Outcome: Progressing °  °Problem: Health Behavior/Discharge Planning: °Goal: Ability to manage health-related needs will improve °Outcome: Progressing °  °Problem: Clinical Measurements: °Goal: Ability to maintain clinical measurements within normal limits will improve °Outcome: Progressing °Goal: Will remain free from infection °Outcome: Progressing °Goal: Diagnostic test results will improve °Outcome: Progressing °Goal: Respiratory complications will improve °Outcome: Progressing °Goal: Cardiovascular complication will be avoided °Outcome: Progressing °  °Problem: Activity: °Goal: Risk for activity intolerance will decrease °Outcome: Progressing °  °Problem: Nutrition: °Goal: Adequate nutrition will be maintained °Outcome: Progressing °  °Problem: Coping: °Goal: Level of anxiety will decrease °Outcome: Progressing °  °Problem: Elimination: °Goal: Will not experience complications related to bowel motility °Outcome: Progressing °Goal: Will not experience complications related to urinary retention °Outcome: Progressing °  °Problem: Pain Managment: °Goal: General experience of comfort will improve °Outcome: Progressing °  °Problem: Safety: °Goal: Ability to remain free from injury will improve °Outcome: Progressing °  °Problem: Skin Integrity: °Goal: Risk for impaired skin integrity will decrease °Outcome: Progressing °  °Problem: Activity: °Goal: Ability to avoid complications of mobility impairment will improve °Outcome: Progressing °  °Problem: Pain Management: °Goal: Pain level will decrease with appropriate interventions °Outcome: Progressing °  °Problem: Skin Integrity: °Goal: Will show signs of wound healing °Outcome: Progressing °  °

## 2021-08-18 NOTE — Progress Notes (Signed)
PROGRESS NOTE    Brittany Munoz  [id number] DOB: [birth year] DOA: 08/13/2021 PCP: Leonel Ramsay, MD   Chief complaint left hip pain. Brief Narrative:  Brittany Munoz is a 88 y.o. female with medical history significant of anxiety, asthma, nonspecified cardiomyopathy, cataracts, COPD, osteoarthritis, depression, type II DM, eczema, gout, hemorrhoids, hyperlipidemia, hypertension, nocturnal hypoxia, osteoporosis, pulmonary fibrosis, seasonal allergies, varicella-zoster who I am familiar with after she went to the emergency department [facility] due to left hip pain X-ray of the hip showed dislocated hip joint with acetabular fracture.  Patient is seen by orthopedics,  resection arthroplasty of the left hip was performed on 1/12.   Assessment & Plan:   Principal Problem:   Acetabulum fracture, left (HCC) Active Problems:   Normocytic anemia   Essential hypertension   Pulmonary fibrosis (HCC)   Tachy-brady syndrome (HCC)   Chronic respiratory failure with hypoxia (HCC)   Stage 3a chronic kidney disease (HCC)   Type 2 diabetes mellitus without complication, without long-term current use of insulin (HCC)   Hypokalemia   Reactive thrombocytosis   Pressure injury of skin   Hypomagnesemia  Acetabulum fracture, left (HCC) Left hip dislocation. Patient hip pain is better after adding oxycodone. Continue to follow.  Pending nursing home placement. Patient currently has a Foley catheter.  I confirmed with patient daughter-in-law, patient did not have Foley cath when she came in.  We will discontinue it.  Iron deficient anemia. Acute blood loss anemia. Reactive thrombocytosis. B12 level normal, iron low, hemoglobin dropped down to 7.3, will give a dose of IV iron.  Pulmonary fibrosis. Chronic respiratory failure with hypoxemia. No bronchospasm today.  Chronic kidney disease stage IIIa ruled  out Hyponatremia. Hypokalemia. Hypomagnesemia Hypophosphatemia. Repleted potassium and phosphorus.  Patient has a poor p.o. intake with hyponatremia.  Will give 500 mL of normal saline.    Type 2 diabetes  Continue sliding scale insulin.  Tachybradycardia syndrome. Patient currently on Toprol 25 mg daily.  Continue telemetry.     DVT prophylaxis: Lovenox Code Status: full Family Communication: daughter in law updated Disposition Plan: I spoke with patient daughter-in-law, patient actually came from nursing home.  She need to return to nursing home tomorrow.     Status is: Inpatient   Remains inpatient appropriate because: Severity of disease, postop pain control.     I/O last 3 completed shifts: In: 62 [P.O.:720] Out: 1375 [id number] Total I/O In: 120 [P.O.:120] Out: -      Consultants:  orthopedics  Procedures: hip  Antimicrobials: none  Subjective: Patient doing better, hip pain much improved with oxycodone. Denies any short of breath or cough No abdominal pain nausea vomiting.  Has no recorded bowel movement for the last few days, will give scheduled MiraLAX. No dysuria hematuria.  Objective: Vitals:   08/17/21 1938 08/18/21 0436 08/18/21 0743 08/18/21 0916  BP: 118/78 (!) 107/48  (!) 107/42  Pulse: 91 92  (!) 104  Resp: 18 16  15   Temp: (!) 97.5 F (36.4 C) 98.1 F (36.7 C)  98 F (36.7 C)  TempSrc:      SpO2: 90% 97% 97% 98%  Weight:      Height:        Intake/Output Summary (Last 24 hours) at 08/18/2021 1155 Last data filed at 08/18/2021 1021 Gross per 24 hour  Intake 360 ml  Output 600 ml  Net -240 ml   Filed Weights   08/15/21 0908 08/15/21 1511  Weight: 68.1 kg 68.1 kg  Examination:  General exam: Appears calm and comfortable  Respiratory system: Clear to auscultation. Respiratory effort normal. Cardiovascular system: S1 & S2 heard, RRR. No JVD, murmurs, rubs, gallops or clicks. No pedal edema. Gastrointestinal system:  Abdomen is nondistended, soft and nontender. No organomegaly or masses felt. Normal bowel sounds heard. Central nervous system: Alert and oriented x3. No focal neurological deficits. Extremities: Symmetric 5 x 5 power. Skin: No rashes, lesions or ulcers Psychiatry: Judgement and insight appear normal. Mood & affect appropriate.     Data Reviewed: I have personally reviewed following labs and imaging studies  CBC: Recent Labs  Lab 08/14/21 0507 08/15/21 0604 08/16/21 0531 08/17/21 0458 08/18/21 0434  WBC 6.2 6.4 8.1 8.0 8.0  NEUTROABS  --  3.9 6.3 5.7 5.1  HGB 9.8* 9.8* 9.5* 7.9* 7.3*  HCT 32.0* 31.9* 32.0* 25.8* 22.9*  MCV 78.2* 79.0* 80.8 78.4* 76.6*  PLT 404* 384 321 262 [id number]   Basic Metabolic Panel: Recent Labs  Lab 08/14/21 0507 08/15/21 0604 08/16/21 0531 08/18/21 0434  NA 135 138 139 131*  K 3.4* 3.8 4.0 3.6  CL 99 100 101 94*  CO2 29 32 26 28  GLUCOSE 88 102* 100* 93  BUN 10 13 9 14   CREATININE 0.60 0.67 0.54 0.81  CALCIUM 8.7* 9.0 8.6* 8.4*  MG  --   --  1.5* 2.1  PHOS  --   --   --  3.4   GFR: Estimated Creatinine Clearance: 45.6 mL/min (by C-G formula based on SCr of 0.81 mg/dL). Liver Function Tests: Recent Labs  Lab 08/14/21 0507  AST 15  ALT 8  ALKPHOS 95  BILITOT 0.6  PROT 5.5*  ALBUMIN 2.6*   No results for input(s): LIPASE, AMYLASE in the last 168 hours. No results for input(s): AMMONIA in the last 168 hours. Coagulation Profile: No results for input(s): INR, PROTIME in the last 168 hours. Cardiac Enzymes: No results for input(s): CKTOTAL, CKMB, CKMBINDEX, TROPONINI in the last 168 hours. BNP (last 3 results) No results for input(s): PROBNP in the last 8760 hours. HbA1C: No results for input(s): HGBA1C in the last 72 hours. CBG: Recent Labs  Lab 08/17/21 1123 08/17/21 1551 08/17/21 1654 08/17/21 2036 08/18/21 0909  GLUCAP 110* 121* 107* 122* 112*   Lipid Profile: No results for input(s): CHOL, HDL, LDLCALC, TRIG, CHOLHDL,  LDLDIRECT in the last 72 hours. Thyroid Function Tests: No results for input(s): TSH, T4TOTAL, FREET4, T3FREE, THYROIDAB in the last 72 hours. Anemia Panel: Recent Labs    08/17/21 1057  VITAMINB12 516  FERRITIN 263  TIBC 185*  IRON 10*   Sepsis Labs: No results for input(s): PROCALCITON, LATICACIDVEN in the last 168 hours.  Recent Results (from the past 240 hour(s))  Resp Panel by RT-PCR (Flu A&B, Covid) Nasopharyngeal Swab     Status: None   Collection Time: 08/13/21  2:17 PM   Specimen: Nasopharyngeal Swab; Nasopharyngeal(NP) swabs in vial transport medium  Result Value Ref Range Status   SARS Coronavirus 2 by RT PCR NEGATIVE NEGATIVE Final    Comment: (NOTE) SARS-CoV-2 target nucleic acids are NOT DETECTED.  The SARS-CoV-2 RNA is generally detectable in upper respiratory specimens during the acute phase of infection. The lowest concentration of SARS-CoV-2 viral copies this assay can detect is 138 copies/mL. A negative result does not preclude SARS-Cov-2 infection and should not be used as the sole basis for treatment or other patient management decisions. A negative result may occur with  improper specimen collection/handling, submission  of specimen other than nasopharyngeal swab, presence of viral mutation(s) within the areas targeted by this assay, and inadequate number of viral copies(<138 copies/mL). A negative result must be combined with clinical observations, patient history, and epidemiological information. The expected result is Negative.  Fact Sheet for Patients:  EntrepreneurPulse.com.au  Fact Sheet for Healthcare Providers:  IncredibleEmployment.be  This test is no t yet approved or cleared by the [place] FDA and  has been authorized for detection and/or diagnosis of SARS-CoV-2 by FDA under an Emergency Use Authorization (EUA). This EUA will remain  in effect (meaning this test can be used) for the duration of  the COVID-19 declaration under Section 564(b)(1) of the Act, 21 U.S.C.section 360bbb-3(b)(1), unless the authorization is terminated  or revoked sooner.       Influenza A by PCR NEGATIVE NEGATIVE Final   Influenza B by PCR NEGATIVE NEGATIVE Final    Comment: (NOTE) The Xpert Xpress SARS-CoV-2/FLU/RSV plus assay is intended as an aid in the diagnosis of influenza from Nasopharyngeal swab specimens and should not be used as a sole basis for treatment. Nasal washings and aspirates are unacceptable for Xpert Xpress SARS-CoV-2/FLU/RSV testing.  Fact Sheet for Patients: EntrepreneurPulse.com.au  Fact Sheet for Healthcare Providers: IncredibleEmployment.be  This test is not yet approved or cleared by the [place] FDA and has been authorized for detection and/or diagnosis of SARS-CoV-2 by FDA under an Emergency Use Authorization (EUA). This EUA will remain in effect (meaning this test can be used) for the duration of the COVID-19 declaration under Section 564(b)(1) of the Act, 21 U.S.C. section 360bbb-3(b)(1), unless the authorization is terminated or revoked.  Performed at [facility], [place] [address] [place], [place] [address]   Surgical PCR screen     Status: None   Collection Time: 08/15/21 12:57 AM   Specimen: Nasal Mucosa; Nasal Swab  Result Value Ref Range Status   MRSA, PCR NEGATIVE NEGATIVE Final   Staphylococcus aureus NEGATIVE NEGATIVE Final    Comment: (NOTE) The Xpert SA Assay (FDA approved for NASAL specimens in patients 64 years of age and older), is one component of a comprehensive surveillance program. It is not intended to diagnose infection nor to guide or monitor treatment. Performed at [facility], [address] [place], [place] [address]   Aerobic/Anaerobic Culture w Gram Stain (surgical/deep wound)     Status: None (Preliminary result)   Collection Time: 08/15/21  4:19 PM    Specimen: PATH Other; Tissue  Result Value Ref Range Status   Specimen Description   Final    HIP LEFT Performed at [facility], [address], [place], [place] [address]    Special Requests   Final    NONE Performed at [facility], [address] [place], [place] [address]    Gram Stain   Final    NO SQUAMOUS EPITHELIAL CELLS SEEN FEW WBC SEEN NO ORGANISMS SEEN    Culture   Final    NO GROWTH 1 DAY Performed at [facility], [place] [address], [place],  [address]    Report Status PENDING  Incomplete         Radiology Studies: No results found.      Scheduled Meds:  amLODipine  5 mg Oral Daily   budesonide  0.25 mg Inhalation BID   calcium carbonate  2 tablet Oral Daily   Chlorhexidine Gluconate Cloth  6 each Topical Daily   docusate sodium  100 mg Oral  BID   DULoxetine  20 mg Oral BID   enoxaparin (LOVENOX) injection  40 mg Subcutaneous Q24H   ferrous gluconate  324 mg Oral QODAY   insulin aspart  0-9 Units Subcutaneous TID WC   ipratropium-albuterol  3 mL Inhalation Q6H   metoprolol succinate  25 mg Oral Daily   montelukast  10 mg Oral QHS   pantoprazole  40 mg Oral Daily   tiotropium  18 mcg Inhalation Daily   torsemide  10 mg Oral Daily   traMADol  25 mg Oral BID   Continuous Infusions:  sodium chloride 100 mL/hr at 08/18/21 0948   ferric gluconate (FERRLECIT) IVPB       LOS: 5 days    Time spent: 32 minutes, more than 50% time spent in direct patient care.    Sharen Hones, MD [facility]   To contact the attending provider between 7A-7P or the covering provider during after hours 7P-7A, please log into the web site [email or web address] and access using universal [place] password for that web site. If you do not have the password, please call the hospital operator.  08/18/2021, 11:55 AM

## 2021-08-18 NOTE — TOC Progression Note (Signed)
Transition of Care [facility]) - Progression Note    Patient Details  Name: Brittany Munoz MRN: [id number] Date of Birth: 05-Oct-1927  [facility]) CM/SW Contact  Joseph Art, [place] Phone Number: 08/18/2021, 1:55 PM  Clinical Narrative:     Plan for d/c on Monday 08/19/2021.  CSW confirmed long term care bed offer at [facility] w/ [facility] admissions coordinator.  Patient will return to room 908, report [phone], ask for RN for 908.  Do not call report before confirming with Lawerance Cruel. CSW updated Attending and patient's daughter-in-law Naleah, Kofo[place] [phone]) [phone] [facility] Phone).       Expected Discharge Plan and Services                                                 Social Determinants of Health (SDOH) Interventions    Readmission Risk Interventions No flowsheet data found.

## 2021-08-18 NOTE — Plan of Care (Signed)
°  Problem: Education: Goal: Knowledge of General Education information will improve Description: Including pain rating scale, medication(s)/side effects and non-pharmacologic comfort measures Outcome: Progressing   Problem: Health Behavior/Discharge Planning: Goal: Ability to manage health-related needs will improve Outcome: Progressing   Problem: Clinical Measurements: Goal: Ability to maintain clinical measurements within normal limits will improve Outcome: Progressing Goal: Will remain free from infection Outcome: Progressing Goal: Diagnostic test results will improve Outcome: Progressing Goal: Respiratory complications will improve Outcome: Progressing Goal: Cardiovascular complication will be avoided Outcome: Progressing   Problem: Activity: Goal: Risk for activity intolerance will decrease Outcome: Progressing   Problem: Nutrition: Goal: Adequate nutrition will be maintained Outcome: Progressing   Problem: Coping: Goal: Level of anxiety will decrease Outcome: Progressing   Problem: Elimination: Goal: Will not experience complications related to bowel motility Outcome: Progressing Goal: Will not experience complications related to urinary retention Outcome: Progressing   Problem: Pain Managment: Goal: General experience of comfort will improve Outcome: Progressing   Problem: Safety: Goal: Ability to remain free from injury will improve Outcome: Progressing   Problem: Skin Integrity: Goal: Risk for impaired skin integrity will decrease Outcome: Progressing   Problem: Activity: Goal: Ability to avoid complications of mobility impairment will improve Outcome: Progressing   Problem: Pain Management: Goal: Pain level will decrease with appropriate interventions Outcome: Progressing   Problem: Skin Integrity: Goal: Will show signs of wound healing Outcome: Progressing

## 2021-08-19 DIAGNOSIS — S32402A Unspecified fracture of left acetabulum, initial encounter for closed fracture: Secondary | ICD-10-CM | POA: Diagnosis not present

## 2021-08-19 DIAGNOSIS — J9611 Chronic respiratory failure with hypoxia: Secondary | ICD-10-CM | POA: Diagnosis not present

## 2021-08-19 DIAGNOSIS — J8410 Pulmonary fibrosis, unspecified: Secondary | ICD-10-CM | POA: Diagnosis not present

## 2021-08-19 LAB — CBC WITH DIFFERENTIAL/PLATELET
Abs Immature Granulocytes: 0.12 10*3/uL — ABNORMAL HIGH (ref 0.00–0.07)
Basophils Absolute: 0.1 10*3/uL (ref 0.0–0.1)
Basophils Relative: 1 %
Eosinophils Absolute: 0.5 10*3/uL (ref 0.0–0.5)
Eosinophils Relative: 5 %
HCT: 25.3 % — ABNORMAL LOW (ref 36.0–46.0)
Hemoglobin: 7.9 g/dL — ABNORMAL LOW (ref 12.0–15.0)
Immature Granulocytes: 1 %
Lymphocytes Relative: 14 %
Lymphs Abs: 1.4 10*3/uL (ref 0.7–4.0)
MCH: 23.9 pg — ABNORMAL LOW (ref 26.0–34.0)
MCHC: 31.2 g/dL (ref 30.0–36.0)
MCV: 76.7 fL — ABNORMAL LOW (ref 80.0–100.0)
Monocytes Absolute: 1.3 10*3/uL — ABNORMAL HIGH (ref 0.1–1.0)
Monocytes Relative: 13 %
Neutro Abs: 6.7 10*3/uL (ref 1.7–7.7)
Neutrophils Relative %: 66 %
Platelets: 314 10*3/uL (ref 150–400)
RBC: 3.3 MIL/uL — ABNORMAL LOW (ref 3.87–5.11)
RDW: 16.1 % — ABNORMAL HIGH (ref 11.5–15.5)
WBC: 10.2 10*3/uL (ref 4.0–10.5)
nRBC: 0 % (ref 0.0–0.2)

## 2021-08-19 LAB — MAGNESIUM: Magnesium: 2 mg/dL (ref 1.7–2.4)

## 2021-08-19 LAB — BASIC METABOLIC PANEL
Anion gap: 11 (ref 5–15)
BUN: 16 mg/dL (ref 8–23)
CO2: 29 mmol/L (ref 22–32)
Calcium: 8.5 mg/dL — ABNORMAL LOW (ref 8.9–10.3)
Chloride: 92 mmol/L — ABNORMAL LOW (ref 98–111)
Creatinine, Ser: 0.71 mg/dL (ref 0.44–1.00)
GFR, Estimated: >60 mL/min (ref >60)
Glucose, Bld: 95 mg/dL (ref 70–99)
Potassium: 3.7 mmol/L (ref 3.5–5.1)
Sodium: 132 mmol/L — ABNORMAL LOW (ref 135–145)

## 2021-08-19 LAB — GLUCOSE, CAPILLARY
Glucose-Capillary: 109 mg/dL — ABNORMAL HIGH (ref 70–99)
Glucose-Capillary: 94 mg/dL (ref 70–99)
Glucose-Capillary: 81 mg/dL (ref 70–99)

## 2021-08-19 MED ORDER — MAGNESIUM HYDROXIDE 400 MG/5ML PO SUSP: 30.0000 mL | Freq: Every day | ORAL | 0 refills | Status: AC | PRN

## 2021-08-19 MED ORDER — DOCUSATE SODIUM 100 MG PO CAPS: 100.0000 mg | ORAL_CAPSULE | Freq: Two times a day (BID) | ORAL | 0 refills | Status: AC

## 2021-08-19 MED ORDER — OXYCODONE HCL 5 MG PO TABS
5.0000 mg | ORAL_TABLET | Freq: Four times a day (QID) | ORAL | 0 refills | Status: AC | PRN
Start: 2021-08-19 — End: ?

## 2021-08-19 MED ORDER — TRAMADOL HCL 50 MG PO TABS: 50.0000 mg | ORAL_TABLET | Freq: Two times a day (BID) | ORAL | 0 refills | Status: AC

## 2021-08-19 MED ORDER — LORAZEPAM 1 MG PO TABS: 2.0000 mg | ORAL_TABLET | Freq: Every day | ORAL | 0 refills | Status: AC

## 2021-08-19 NOTE — Discharge Instructions (Signed)
Diet: As you were doing prior to hospitalization   Shower:  May shower but keep the wounds dry, use an occlusive plastic wrap, NO SOAKING IN TUB.  If the bandage gets wet, change with a clean dry gauze.  Dressing:  You may change your dressing as needed. Change the dressing with sterile gauze dressing.    Activity:  Increase activity slowly as tolerated, but follow the weight bearing instructions below.  No lifting or driving for 6 weeks.  Weight Bearing:   Can weightbear as tolerated to the left leg.  Apply knee immobilizer as tolerated to prevent further flexion contractures of the left leg.  To prevent constipation: you may use a stool softener such as -  Colace (over the counter) 100 mg by mouth twice a day  Drink plenty of fluids (prune juice may be helpful) and high fiber foods Miralax (over the counter) for constipation as needed.    Itching:  If you experience itching with your medications, try taking only a single pain pill, or even half a pain pill at a time.  You may take up to 10 pain pills per day, and you can also use benadryl over the counter for itching or also to help with sleep.   Precautions:  If you experience chest pain or shortness of breath - call 911 immediately for transfer to the hospital emergency department!!  If you develop a fever greater that 101 F, purulent drainage from wound, increased redness or drainage from wound, or calf pain-Call [facility]                                              Follow- Up Appointment:  Please call for an appointment to be seen in 2 weeks at [facility]

## 2021-08-19 NOTE — Discharge Summary (Addendum)
Physician Discharge Summary  Patient ID: Brittany Munoz MRN: [id number] DOB/AGE: 1928-02-02 88 y.o.  Admit date: 08/13/2021 Discharge date: 08/19/2021  Admission Diagnoses:  Discharge Diagnoses:  Principal Problem:   Acetabulum fracture, left ([place]) Active Problems:   Normocytic anemia   Essential hypertension   Pulmonary fibrosis (HCC)   Tachy-brady syndrome (HCC)   Chronic respiratory failure with hypoxia (HCC)   Stage 3a chronic kidney disease (HCC)   Type 2 diabetes mellitus without complication, without long-term current use of insulin (HCC)   Hypokalemia   Reactive thrombocytosis   Pressure injury of skin   Hypomagnesemia   Discharged Condition: stable  Hospital Course:   Brittany Munoz is a 88 y.o. female with medical history significant of anxiety, asthma, nonspecified cardiomyopathy, cataracts, COPD, osteoarthritis, depression, type II DM, eczema, gout, hemorrhoids, hyperlipidemia, hypertension, nocturnal hypoxia, osteoporosis, pulmonary fibrosis, seasonal allergies, varicella-zoster who I am familiar with after she went to the emergency department [facility] due to left hip pain X-ray of the hip showed dislocated hip joint with acetabular fracture.  Patient is seen by orthopedics,  resection arthroplasty of the left hip was performed on 1/12.   Acetabulum fracture, left (HCC) Left hip dislocation. Patient is status post resection arthroplasty. Patient will not be able to stand or bear weight on left hip. We will continue pain medicine. Long-term prognosis is very poor due to immobility.  Please refer patient to Hospice as outpatient.  Urinary retention. Was not able to urinate after taking out a Foley catheter.  Foley catheter will be anchored again.  Due to poor prognosis and severe pain with movement, Foley catheter should be kept for comfort.   Iron deficient anemia. Acute blood loss anemia. Reactive thrombocytosis. B12 level normal, iron low,  hemoglobin dropped down to 7.3, received a dose of IV iron. Continue oral iron.   Pulmonary fibrosis. Chronic respiratory failure with hypoxemia. Resume home medicines.  Chronic kidney disease stage IIIa ruled out Hyponatremia. Hypokalemia. Hypomagnesemia Hypophosphatemia. Electrolytes normalized, patient still has mild hyponatremia.    Type 2 diabetes  Continue sliding scale insulin.  Tachybradycardia syndrome. Patient currently on Toprol 25 mg daily.    Pressure ulcers POA. Follow with RN.  Pressure Injury 07/19/21 Sacrum Mid;Lower Stage 2 -  Partial thickness loss of dermis presenting as a shallow open injury with a red, pink wound bed without slough. (Active)  07/19/21 2030  Location: Sacrum  Location Orientation: Mid;Lower  Staging: Stage 2 -  Partial thickness loss of dermis presenting as a shallow open injury with a red, pink wound bed without slough.  Wound Description (Comments):   Present on Admission: Yes     Pressure Injury 08/14/21 Coccyx Mid Stage 2 -  Partial thickness loss of dermis presenting as a shallow open injury with a red, pink wound bed without slough. open pink with no drainage (Active)  08/14/21 1030  Location: Coccyx  Location Orientation: Mid  Staging: Stage 2 -  Partial thickness loss of dermis presenting as a shallow open injury with a red, pink wound bed without slough.  Wound Description (Comments): open pink with no drainage  Present on Admission: Yes        Consults: orthopedic surgery  Significant Diagnostic Studies:   Treatments: Hip surgery  Discharge Exam: Blood pressure 110/84, pulse (!) 59, temperature (!) 97.3 F (36.3 C), resp. rate 15, height 5\' 8"  (1.727 m), weight 68.1 kg, SpO2 95 %. General appearance: alert and cooperative Resp: clear to auscultation bilaterally Cardio: regular  rate and rhythm, S1, S2 normal, no murmur, click, rub or gallop GI: soft, non-tender; bowel sounds normal; no masses,  no  organomegaly Extremities: extremities normal, atraumatic, no cyanosis or edema  Disposition: Discharge disposition: 03-Skilled Nursing Facility       Discharge Instructions     Diet - low sodium heart healthy   Complete by: As directed    Discharge wound care:   Complete by: As directed    Follow with RN   Increase activity slowly   Complete by: As directed       Allergies as of 08/19/2021       Reactions   Aspirin Anaphylaxis   Cefuroxime Axetil Swelling   TOLERATED CEFAZOLIN 06/04/21   Moxifloxacin Hcl In Nacl    Other reaction(s): Unknown   Oxycodone Nausea Only   Sulfamethoxazole-trimethoprim    Other reaction(s): Unknown   Tramadol    Other reaction(s): Unknown        Medication List     TAKE these medications    acetaminophen 650 MG CR tablet Commonly known as: TYLENOL Take 1,300 mg by mouth 2 (two) times daily.   amLODipine 5 MG tablet Commonly known as: NORVASC Take 5 mg by mouth daily.   budesonide 0.25 MG/2ML nebulizer solution Commonly known as: PULMICORT Inhale 0.25 mLs into the lungs in the morning and at bedtime.   calcium carbonate 500 MG chewable tablet Commonly known as: TUMS - dosed in mg elemental calcium Chew 2 tablets by mouth daily.   cyanocobalamin 1000 MCG/ML injection Commonly known as: (VITAMIN B-12) Inject 1,000 mcg into the muscle Munoz 30 (thirty) days.   docusate sodium 100 MG capsule Commonly known as: COLACE Take 1 capsule (100 mg total) by mouth 2 (two) times daily.   DULoxetine 60 MG capsule Commonly known as: CYMBALTA Take 60 mg by mouth daily.   enoxaparin 40 MG/0.4ML injection Commonly known as: LOVENOX Inject 0.4 mLs (40 mg total) into the skin daily.   ferrous gluconate 240 (27 FE) MG tablet Commonly known as: FERGON Take 1 tablet (240 mg total) by mouth 3 (three) times daily with meals. What changed: when to take this   guaiFENesin 600 MG 12 hr tablet Commonly known as: MUCINEX Take 600 mg by  mouth 2 times daily at 12 noon and 4 pm.   LORazepam 1 MG tablet Commonly known as: ATIVAN Take 2 tablets (2 mg total) by mouth at bedtime. What changed: Another medication with the same name was removed. Continue taking this medication, and follow the directions you see here.   magnesium hydroxide 400 MG/5ML suspension Commonly known as: MILK OF MAGNESIA Take 30 mLs by mouth daily as needed for mild constipation.   metoprolol succinate 25 MG 24 hr tablet Commonly known as: TOPROL-XL Take 25 mg by mouth daily.   montelukast 10 MG tablet Commonly known as: SINGULAIR Take 10 mg by mouth at bedtime.   omeprazole 20 MG capsule Commonly known as: PRILOSEC Take 20 mg by mouth daily.   oxyCODONE 5 MG immediate release tablet Commonly known as: Oxy IR/ROXICODONE Take 1 tablet (5 mg total) by mouth Munoz 6 (six) hours as needed for breakthrough pain or severe pain. What changed:  how much to take when to take this   ProAir HFA 108 (90 Base) MCG/ACT inhaler Generic drug: albuterol USE 2 PUFFS Munoz 4 HOURS  AS NEEDED What changed:  See the new instructions. Another medication with the same name was removed. Continue taking this medication, and  follow the directions you see here.   Spiriva Respimat 2.5 MCG/ACT Aers Generic drug: Tiotropium Bromide Monohydrate Inhale 2 puffs into the lungs daily.   torsemide 20 MG tablet Commonly known as: DEMADEX Take 10 mg by mouth daily.   traMADol 50 MG tablet Commonly known as: ULTRAM Take 1 tablet (50 mg total) by mouth 2 (two) times daily. What changed: how much to take   Vitamin D (Ergocalciferol) 1.25 MG (50000 UNIT) Caps capsule Commonly known as: DRISDOL Take 1 capsule by mouth once a week.               Discharge Care Instructions  (From admission, onward)           Start     Ordered   08/19/21 0000  Discharge wound care:       Comments: Follow with RN   08/19/21 1010            Follow-up Information      Lattie Corns, PA-C Follow up in 6 week(s).   Specialty: Physician Assistant Why: Jodell Cipro can be removed by SNF on 08/29/21 Contact information: [place] [address] [phone]                35 minutes Signed: Sharen Hones 08/19/2021, 10:10 AM

## 2021-08-19 NOTE — Progress Notes (Signed)
°  Subjective: 4 Days Post-Op Procedure(s) (LRB): TOTAL HIP DIS-ARTHROPLASTY (Left) Patient states she is well this morning. Patient having difficulty with urination, in and out cath performed. Still moderate-severe pain with hip ROM at this time. Patient has had a BM since surgery. Plan will be for d/c to either SNF or long term care facility.  Objective: Vital signs in last 24 hours: Temp:  [98 F (36.7 C)-98.5 F (36.9 C)] 98.5 F (36.9 C) (01/16 0628) Pulse Rate:  [93-104] 101 (01/16 0628) Resp:  [15-20] 20 (01/16 0628) BP: (107-134)/(42-65) 134/61 (01/16 0628) SpO2:  [94 %-100 %] 97 % (01/16 0628)  Intake/Output from previous day:  Intake/Output Summary (Last 24 hours) at 08/19/2021 0801 Last data filed at 08/19/2021 0300 Gross per 24 hour  Intake 610 ml  Output 1400 ml  Net -790 ml    Intake/Output this shift: No intake/output data recorded.  Labs: Recent Labs    08/17/21 0458 08/18/21 0434 08/19/21 0526  HGB 7.9* 7.3* 7.9*   Recent Labs    08/18/21 0434 08/19/21 0526  WBC 8.0 10.2  RBC 2.99* 3.30*  HCT 22.9* 25.3*  PLT 253 314   Recent Labs    08/18/21 0434 08/19/21 0526  NA 131* 132*  K 3.6 3.7  CL 94* 92*  CO2 28 29  BUN 14 16  CREATININE 0.81 0.71  GLUCOSE 93 95  CALCIUM 8.4* 8.5*   No results for input(s): LABPT, INR in the last 72 hours.  EXAM General - Patient is  alert and oriented x4 today  Extremity - Neurovascular intact Dorsiflexion/Plantar flexion intact Compartment soft Dressing/Incision -clean, dry, no drainage Motor Function - intact, moving foot and toes well on exam.   Assessment/Plan: 4 Days Post-Op Procedure(s) (LRB): TOTAL HIP DIS-ARTHROPLASTY (Left) Principal Problem:   Acetabulum fracture, left (HCC) Active Problems:   Normocytic anemia   Essential hypertension   Pulmonary fibrosis (HCC)   Tachy-brady syndrome (HCC)   Chronic respiratory failure with hypoxia (HCC)   Stage 3a chronic kidney disease (HCC)    Type 2 diabetes mellitus without complication, without long-term current use of insulin (HCC)   Hypokalemia   Reactive thrombocytosis   Pressure injury of skin   Hypomagnesemia  Estimated body mass index is 22.83 kg/m as calculated from the following:   Height as of this encounter: 5\' 8"  (1.727 m).   Weight as of this encounter: 68.1 kg. Advance diet  Labs and vitals reviewed this morning. Patient still with moderate pain to the left hip. Can move as tolerated.  Will place order for knee immobilizer to applied to the left leg to prevent further flexion contracture. Possible d/c from hospital today pending insurance and bed availability. Staples can be removed by SNF on 08/29/21.  Follow-up with [facility] orthopaedics in 6 weeks for repeat evaluation.  DVT Prophylaxis - Lovenox, Ted hose, and SCDs  Raquel Aahil Fredin, PA-C [facility] 08/19/2021, 8:01 AM

## 2021-08-19 NOTE — TOC Progression Note (Addendum)
Transition of Care [facility]) - Progression Note    Patient Details  Name: Brittany Munoz MRN: [id number] Date of Birth: 1928-06-18  Transition of Care [facility]) CM/SW Contact  Marlowe Sax, RN Phone Number: 08/19/2021, 12:48 PM  Clinical Narrative:   Sherron Monday with [name] the POA and provided the room number to go to [facility] today  Called EMS to transport they have a busy list and will come as soon as they can       Expected Discharge Plan and Services           Expected Discharge Date: 08/19/21                                     Social Determinants of Health (SDOH) Interventions    Readmission Risk Interventions No flowsheet data found.

## 2021-08-19 NOTE — Plan of Care (Signed)

## 2021-08-19 NOTE — TOC Progression Note (Signed)
Transition of Care [facility]) - Progression Note    Patient Details  Name: Brittany Munoz MRN: [id number] Date of Birth: 11-20-1927  Transition of Care [facility]) CM/SW Contact  Marlowe Sax, RN Phone Number: 08/19/2021, 10:42 AM  Clinical Narrative:   Spoke with the patient's daughter [name] She will return to [facility] place and be followed by hospice there, They plan to dc today         Expected Discharge Plan and Services           Expected Discharge Date: 08/19/21                                     Social Determinants of Health (SDOH) Interventions    Readmission Risk Interventions No flowsheet data found.

## 2021-08-19 NOTE — Care Management Important Message (Signed)
Important Message  Patient Details  Name: Brittany Munoz MRN: [id number] Date of Birth: Oct 09, 1927   Medicare Important Message Given:  Yes  Reviewed Medicare IM with Maylon Peppers, POA/daughter, at [phone].  Confirmed they still have copy to reference.   Dannette Barbara 08/19/2021, 3:27 PM

## 2021-08-21 LAB — AEROBIC/ANAEROBIC CULTURE W GRAM STAIN (SURGICAL/DEEP WOUND)
Culture: NO GROWTH
Gram Stain: NONE SEEN

## 2021-08-22 ENCOUNTER — Encounter: Payer: Self-pay | Admitting: Surgery

## 2021-10-02 DEATH — deceased
